# Patient Record
Sex: Male | Born: 1935 | Race: White | Hispanic: No | Marital: Married | Smoking: Former smoker
Health system: Southern US, Community
[De-identification: ages and names within clinical notes are randomized; demographics above are authoritative.]

## PROBLEM LIST (undated history)

## (undated) DIAGNOSIS — Z86010 Personal history of colonic polyps: Secondary | ICD-10-CM

## (undated) DIAGNOSIS — B0230 Zoster ocular disease, unspecified: Secondary | ICD-10-CM

## (undated) DIAGNOSIS — N400 Enlarged prostate without lower urinary tract symptoms: Secondary | ICD-10-CM

## (undated) DIAGNOSIS — E785 Hyperlipidemia, unspecified: Secondary | ICD-10-CM

## (undated) DIAGNOSIS — R972 Elevated prostate specific antigen [PSA]: Secondary | ICD-10-CM

## (undated) DIAGNOSIS — D400 Neoplasm of uncertain behavior of prostate: Secondary | ICD-10-CM

## (undated) DIAGNOSIS — R413 Other amnesia: Secondary | ICD-10-CM

## (undated) DIAGNOSIS — M1990 Unspecified osteoarthritis, unspecified site: Secondary | ICD-10-CM

## (undated) DIAGNOSIS — N402 Nodular prostate without lower urinary tract symptoms: Secondary | ICD-10-CM

## (undated) DIAGNOSIS — H409 Unspecified glaucoma: Secondary | ICD-10-CM

## (undated) DIAGNOSIS — E039 Hypothyroidism, unspecified: Secondary | ICD-10-CM

## (undated) DIAGNOSIS — B029 Zoster without complications: Secondary | ICD-10-CM

## (undated) DIAGNOSIS — D490 Neoplasm of unspecified behavior of digestive system: Secondary | ICD-10-CM

## (undated) DIAGNOSIS — K5900 Constipation, unspecified: Secondary | ICD-10-CM

## (undated) HISTORY — DX: Unspecified glaucoma: H40.9

## (undated) HISTORY — DX: Unspecified osteoarthritis, unspecified site: M19.90

## (undated) HISTORY — DX: Constipation, unspecified: K59.00

## (undated) HISTORY — DX: Other amnesia: R41.3

## (undated) HISTORY — DX: Hypothyroidism, unspecified: E03.9

## (undated) HISTORY — DX: Neoplasm of uncertain behavior of prostate: D40.0

## (undated) HISTORY — DX: Hyperlipidemia, unspecified: E78.5

## (undated) HISTORY — DX: Zoster ocular disease, unspecified: B02.30

## (undated) HISTORY — DX: Benign prostatic hyperplasia without lower urinary tract symptoms: N40.0

## (undated) HISTORY — DX: Elevated prostate specific antigen (PSA): R97.20

## (undated) HISTORY — DX: Personal history of colonic polyps: Z86.010

## (undated) HISTORY — DX: Nodular prostate without lower urinary tract symptoms: N40.2

## (undated) HISTORY — DX: Zoster without complications: B02.9

---
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
## 1970-09-29 HISTORY — PX: VASECTOMY: SHX75

## 1983-09-30 DIAGNOSIS — D490 Neoplasm of unspecified behavior of digestive system: Secondary | ICD-10-CM

## 1983-09-30 HISTORY — DX: Neoplasm of unspecified behavior of digestive system: D49.0

## 1991-09-30 HISTORY — PX: TUMOR REMOVAL: SHX12

## 1997-07-17 HISTORY — PX: COLECTOMY: SHX59

## 1998-09-29 HISTORY — PX: COLONOSCOPY: SHX174

## 2001-03-30 ENCOUNTER — Other Ambulatory Visit: Admission: RE | Admit: 2001-03-30 | Discharge: 2001-03-30 | Payer: Self-pay | Admitting: Urology

## 2002-04-21 ENCOUNTER — Ambulatory Visit (HOSPITAL_COMMUNITY): Admission: RE | Admit: 2002-04-21 | Discharge: 2002-04-21 | Payer: Self-pay | Admitting: Gastroenterology

## 2002-04-21 HISTORY — PX: COLONOSCOPY: SHX174

## 2003-12-28 ENCOUNTER — Emergency Department (HOSPITAL_COMMUNITY): Admission: EM | Admit: 2003-12-28 | Discharge: 2003-12-28 | Payer: Self-pay | Admitting: Emergency Medicine

## 2004-06-10 ENCOUNTER — Ambulatory Visit (HOSPITAL_COMMUNITY): Admission: RE | Admit: 2004-06-10 | Discharge: 2004-06-10 | Payer: Self-pay | Admitting: Gastroenterology

## 2004-06-10 HISTORY — PX: COLONOSCOPY: SHX174

## 2004-06-10 LAB — HM COLONOSCOPY

## 2008-09-29 HISTORY — PX: CATARACT EXTRACTION W/ INTRAOCULAR LENS IMPLANT: SHX1309

## 2009-03-25 DIAGNOSIS — R413 Other amnesia: Secondary | ICD-10-CM

## 2009-03-25 HISTORY — DX: Other amnesia: R41.3

## 2010-09-20 DIAGNOSIS — Z860100 Personal history of colon polyps, unspecified: Secondary | ICD-10-CM

## 2010-09-20 DIAGNOSIS — N400 Enlarged prostate without lower urinary tract symptoms: Secondary | ICD-10-CM

## 2010-09-20 DIAGNOSIS — H409 Unspecified glaucoma: Secondary | ICD-10-CM

## 2010-09-20 DIAGNOSIS — E785 Hyperlipidemia, unspecified: Secondary | ICD-10-CM

## 2010-09-20 DIAGNOSIS — M1990 Unspecified osteoarthritis, unspecified site: Secondary | ICD-10-CM

## 2010-09-20 HISTORY — DX: Unspecified glaucoma: H40.9

## 2010-09-20 HISTORY — DX: Benign prostatic hyperplasia without lower urinary tract symptoms: N40.0

## 2010-09-20 HISTORY — DX: Unspecified osteoarthritis, unspecified site: M19.90

## 2010-09-20 HISTORY — DX: Hyperlipidemia, unspecified: E78.5

## 2010-09-20 HISTORY — DX: Personal history of colonic polyps: Z86.010

## 2010-09-20 HISTORY — DX: Personal history of colon polyps, unspecified: Z86.0100

## 2010-09-24 DIAGNOSIS — K5900 Constipation, unspecified: Secondary | ICD-10-CM

## 2010-09-24 HISTORY — DX: Constipation, unspecified: K59.00

## 2011-05-02 DIAGNOSIS — B0230 Zoster ocular disease, unspecified: Secondary | ICD-10-CM

## 2011-05-02 HISTORY — DX: Zoster ocular disease, unspecified: B02.30

## 2011-05-04 ENCOUNTER — Emergency Department (HOSPITAL_COMMUNITY)
Admission: EM | Admit: 2011-05-04 | Discharge: 2011-05-04 | Disposition: A | Discharge status: Home / Self Care | Payer: Medicare Other | Attending: Emergency Medicine | Admitting: Emergency Medicine

## 2011-05-04 DIAGNOSIS — B029 Zoster without complications: Secondary | ICD-10-CM | POA: Insufficient documentation

## 2011-05-04 DIAGNOSIS — H409 Unspecified glaucoma: Secondary | ICD-10-CM | POA: Insufficient documentation

## 2011-05-04 DIAGNOSIS — H5710 Ocular pain, unspecified eye: Secondary | ICD-10-CM | POA: Insufficient documentation

## 2011-05-04 LAB — BASIC METABOLIC PANEL
BUN: 17 mg/dL (ref 6–23)
CO2: 26 mEq/L (ref 19–32)
Creatinine, Ser: 0.97 mg/dL (ref 0.50–1.35)
GFR calc non Af Amer: >60 mL/min (ref >60)
Glucose, Bld: 110 mg/dL — ABNORMAL HIGH (ref 70–99)

## 2011-05-04 LAB — CBC
HCT: 42.1 % (ref 39.0–52.0)
MCH: 30.6 pg (ref 26.0–34.0)
Platelets: 168 10*3/uL (ref 150–400)
RBC: 4.71 MIL/uL (ref 4.22–5.81)
RDW: 13.1 % (ref 11.5–15.5)

## 2011-05-13 DIAGNOSIS — R972 Elevated prostate specific antigen [PSA]: Secondary | ICD-10-CM

## 2011-05-13 DIAGNOSIS — B029 Zoster without complications: Secondary | ICD-10-CM

## 2011-05-13 DIAGNOSIS — D400 Neoplasm of uncertain behavior of prostate: Secondary | ICD-10-CM

## 2011-05-13 HISTORY — DX: Elevated prostate specific antigen (PSA): R97.20

## 2011-05-13 HISTORY — DX: Zoster without complications: B02.9

## 2011-05-13 HISTORY — DX: Neoplasm of uncertain behavior of prostate: D40.0

## 2012-05-18 DIAGNOSIS — N402 Nodular prostate without lower urinary tract symptoms: Secondary | ICD-10-CM

## 2012-05-18 HISTORY — DX: Nodular prostate without lower urinary tract symptoms: N40.2

## 2012-06-14 ENCOUNTER — Other Ambulatory Visit: Payer: Self-pay | Admitting: Dermatology

## 2012-11-02 ENCOUNTER — Other Ambulatory Visit: Payer: Self-pay | Admitting: Dermatology

## 2013-04-12 ENCOUNTER — Non-Acute Institutional Stay: Payer: Medicare Other | Admitting: Internal Medicine

## 2013-04-12 ENCOUNTER — Encounter: Payer: Self-pay | Admitting: Internal Medicine

## 2013-04-12 VITALS — BP 122/64 | HR 72 | Temp 97.2°F | Ht 67.0 in | Wt 178.0 lb

## 2013-04-12 DIAGNOSIS — R972 Elevated prostate specific antigen [PSA]: Secondary | ICD-10-CM

## 2013-04-12 DIAGNOSIS — N403 Nodular prostate with lower urinary tract symptoms: Secondary | ICD-10-CM

## 2013-04-12 DIAGNOSIS — R42 Dizziness and giddiness: Secondary | ICD-10-CM

## 2013-04-12 DIAGNOSIS — N138 Other obstructive and reflux uropathy: Secondary | ICD-10-CM | POA: Insufficient documentation

## 2013-04-12 MED ORDER — TAMSULOSIN HCL 0.4 MG PO CAPS: ORAL_CAPSULE | ORAL | Status: DC

## 2013-04-12 NOTE — Progress Notes (Signed)
  Subjective:    Patient ID: Joel [name], male    DOB: 1921-06-11, 79 y.o.   MRN: [id number]  HPI Dizzy for a month. Now improved. Just when walking and when he would get out bed in the morning.  Having urinary frequency. Was drinking about 8 glasses daily, but he has now cut back to about 5 glasses daily. Nocturia x 4-5. Denies dysuria or hematuria. Has hesitation. Has some double voiding.      Review of Systems  Constitutional: Positive for activity change. Negative for fever, chills, appetite change and fatigue.  HENT: Negative.   Eyes: Negative.   Respiratory: Negative.   Cardiovascular: Negative for chest pain, palpitations and leg swelling.  Gastrointestinal: Negative.   Endocrine: Negative.   Genitourinary: Positive for urgency, frequency and difficulty urinating.  Musculoskeletal: Negative.   Skin: Negative.   Neurological:       Recent dizziness has resolved.  Hematological: Negative.   Psychiatric/Behavioral: Negative.        Objective:BP 122/64  Pulse 72  Temp(Src) 97.2 F (36.2 C) (Oral)  Ht 5\' 7"  (1.702 m)  Wt 178 lb (80.74 kg)  BMI 27.87 kg/m2    Physical Exam  Constitutional: He is oriented to person, place, and time. He appears well-developed and well-nourished. No distress.  HENT:  Head: Normocephalic and atraumatic.  Eyes:  Corrective lenses.  Neck: Neck supple. No JVD present. No tracheal deviation present. No thyromegaly present.  Cardiovascular: Normal rate, regular rhythm, normal heart sounds and intact distal pulses.  Exam reveals no gallop and no friction rub.   No murmur heard. Pulmonary/Chest: No respiratory distress. He has no wheezes. He has no rales. He exhibits no tenderness.  Abdominal: He exhibits no distension and no mass. There is no tenderness.  Musculoskeletal: Normal range of motion. He exhibits no edema and no tenderness.  Lymphadenopathy:    He has no cervical adenopathy.  Neurological: He is alert and oriented to person,  place, and time. No cranial nerve deficit. Coordination normal.  Skin: No rash noted. No erythema. No pallor.  Psychiatric: He has a normal mood and affect. His behavior is normal. Judgment and thought content normal.          Assessment & Plan:  Nodular prostate with urinary obstruction - Plan: tamsulosin (FLOMAX) 0.4 MG CAPS, Urinalysis with Reflex Microscopic, CULTURE, URINE COMPREHENSIVE  Dizzy: improved - Plan: Comprehensive metabolic panel  Elevated prostate specific antigen (PSA) - Plan: PSA

## 2013-04-12 NOTE — Patient Instructions (Addendum)
Start tamsulosin for enlarged prostate that is likely to be the cause of your urinary frequency.

## 2013-04-18 ENCOUNTER — Other Ambulatory Visit: Payer: Self-pay

## 2013-04-18 ENCOUNTER — Telehealth: Payer: Self-pay

## 2013-04-18 MED ORDER — AMPICILLIN 500 MG PO CAPS: ORAL_CAPSULE | ORAL | Status: DC

## 2013-04-18 NOTE — Telephone Encounter (Signed)
Spoke with Mrs Vera about urine report, shows infection, will fax Rx to [facility].

## 2013-04-27 ENCOUNTER — Encounter: Payer: Self-pay | Admitting: Internal Medicine

## 2013-05-02 ENCOUNTER — Other Ambulatory Visit: Payer: Self-pay | Admitting: Dermatology

## 2013-05-19 HISTORY — PX: SKIN CANCER EXCISION: SHX779

## 2013-05-23 ENCOUNTER — Encounter: Payer: Self-pay | Admitting: Internal Medicine

## 2013-05-24 ENCOUNTER — Non-Acute Institutional Stay: Payer: Medicare Other | Admitting: Internal Medicine

## 2013-05-24 ENCOUNTER — Encounter: Payer: Self-pay | Admitting: Internal Medicine

## 2013-05-24 VITALS — BP 112/66 | HR 60 | Temp 96.4°F | Ht 67.0 in | Wt 177.0 lb

## 2013-05-24 DIAGNOSIS — C44221 Squamous cell carcinoma of skin of unspecified ear and external auricular canal: Secondary | ICD-10-CM

## 2013-05-24 DIAGNOSIS — E785 Hyperlipidemia, unspecified: Secondary | ICD-10-CM

## 2013-05-24 DIAGNOSIS — R972 Elevated prostate specific antigen [PSA]: Secondary | ICD-10-CM

## 2013-05-24 DIAGNOSIS — C44222 Squamous cell carcinoma of skin of right ear and external auricular canal: Secondary | ICD-10-CM

## 2013-05-24 DIAGNOSIS — M1990 Unspecified osteoarthritis, unspecified site: Secondary | ICD-10-CM

## 2013-05-24 DIAGNOSIS — R413 Other amnesia: Secondary | ICD-10-CM

## 2013-05-24 NOTE — Progress Notes (Signed)
Subjective:    Patient ID: Joel [name], male    DOB: 1921-06-07, 79 y.o.   MRN: [id number]  HPI  Feeling well. Here for review of medical issues and a completer exam.  Elevated prostate specific antigen (PSA): was nor rechecked prior to this visit  Other and unspecified hyperlipidemia: satisfactory values  Memory loss: mild  Osteoarthrosis, unspecified whether generalized or localized, unspecified site: no acute pain. Chronic joint stiffness in knees and hips.  Squamous cell cancer of external ear, right; bandaged. Removed squamous cell cancer 05/19/13 by Dr. Irene Limbo.     Current Outpatient Prescriptions on File Prior to Visit  Medication Sig Dispense Refill  . ampicillin (PRINCIPEN) 500 MG capsule Take one capsule three times daily for 10 days for UTI  30 capsule  0  . docusate sodium (COLACE) 100 MG capsule Take 100 mg by mouth. Take one daily for stool softener as needed      . latanoprost (XALATAN) 0.005 % ophthalmic solution One drop in each eye daily      . Multiple Vitamins-Minerals (CENTRUM) tablet Take 1 tablet by mouth daily.      . Psyllium (NATURAL FIBER LAXATIVE PO) Take by mouth. Use as directed      . tamsulosin (FLOMAX) 0.4 MG CAPS One daily to help prostate and to reduce urinary frequency  90 capsule  3  . timolol (TIMOPTIC-XR) 0.5 % ophthalmic gel-forming One drop in each eye daily       No current facility-administered medications on file prior to visit.   Immunization History  Administered Date(s) Administered  . Influenza Whole 06/29/2012  . Pneumococcal Polysaccharide 09/30/1995  . Td 09/29/1994  . Zoster 09/30/2007    Active Ambulatory Problems    Diagnosis Date Noted  . Nodular prostate with urinary obstruction 04/12/2013   Resolved Ambulatory Problems    Diagnosis Date Noted  . No Resolved Ambulatory Problems   Past Medical History  Diagnosis Date  . Nodular prostate without urinary obstruction 05/18/2012  . Herpes zoster 05/13/2011  .  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 Elevated prostate specific antigen (PSA) 05/13/2011  . Herpes zoster with ophthalmic complication 05/02/2011  . Unspecified constipation 09/24/2010  . Other and unspecified hyperlipidemia 09/20/2010  . Unspecified glaucoma(365.9) 09/20/2010  . Hypertrophy of prostate without urinary obstruction and other lower urinary tract symptoms (LUTS) 09/20/2010  . Osteoarthrosis, unspecified whether generalized or localized, unspecified site 09/20/2010  . Personal history of colonic polyps 09/20/2010  . Memory loss 03/25/2009   Past Surgical History  Procedure Laterality Date  . Tonsillectomy  1938  . Appendectomy  1940  . Vasectomy  1972  . Colectomy  07/17/1997    sigmoid submucosal lipoma Dr. Randa Evens  . Cataract extraction w/ intraocular lens implant Right 2010    Dr. Charlotte Sanes  . Tumor removal  1993    Removal of Benign Intestinal Tumor   . Skin cancer excision Right 05/19/2013    ear Dr. Lynnell Dike  . Colonoscopy  2000    normal  . Colonoscopy  04/21/2002    polypectomy  . Colonoscopy  06/10/2004    no polyps   CONSULTANTS Derm: [facility] Derm:  Irene Limbo GI: Claria Dice Ophth: [facility]rology: Vernie Ammons   Family Status  Relation Status Death Age  . Mother Deceased     Unknown cause of death   . Father Deceased     Heart Disease   . Daughter Alive   . Son Alive   . Son  Alive   . Son Alive   . Brother Deceased     Heart Disease  . Sister Deceased     Heart Disease   . Brother Deceased     Lung Cancer   . Brother Deceased     Lung Cancer   . Sister Deceased     Lung Cancer    History   Social History  . Marital Status: Married    Spouse Name: N/A    Number of Children: N/A  . Years of Education: N/A   Occupational History  . retired [employer]vertising account planner    Social History Main Topics  . Smoking status: Former Smoker    Quit date: 04/12/1974  . Smokeless tobacco: Never Used  . Alcohol Use: Yes     Comment: wine  occasionaly  . Drug Use: No  . Sexual Activity: None   Other Topics Concern  . None   Social History Narrative   Lives at [facility] with wife, moved in 02/10/2006    Review of Systems  Constitutional: Positive for activity change. Negative for fever, chills, appetite change and fatigue.  HENT: Negative.   Eyes: Negative.   Respiratory: Negative.   Cardiovascular: Negative for chest pain, palpitations and leg swelling.  Gastrointestinal: Negative.   Endocrine: Negative.   Genitourinary: Positive for urgency, frequency and difficulty urinating.       Hx of nodular prostate.  Musculoskeletal: Negative.   Skin: Negative.        History of herpes zoster in the ophthalmic branch that is healed.  Neurological: Negative.   Hematological: Negative.   Psychiatric/Behavioral: Negative.        Objective:   Physical Exam  Constitutional: He is oriented to person, place, and time. He appears well-developed and well-nourished. No distress.  HENT:  Head: Normocephalic and atraumatic.  Eyes:  Corrective lenses.  Neck: Neck supple. No JVD present. No tracheal deviation present. No thyromegaly present.  Cardiovascular: Normal rate, regular rhythm, normal heart sounds and intact distal pulses.  Exam reveals no gallop and no friction rub.   No murmur heard. Pulmonary/Chest: No respiratory distress. He has no wheezes. He has no rales. He exhibits no tenderness.  Abdominal: He exhibits no distension and no mass. There is no tenderness.  Genitourinary: Rectum normal and penis normal. Guaiac negative stool. No penile tenderness.  Prostate is 2+ enlarged and nodular.  Musculoskeletal: Normal range of motion. He exhibits no edema and no tenderness.  Lymphadenopathy:    He has no cervical adenopathy.  Neurological: He is alert and oriented to person, place, and time. No cranial nerve deficit. Coordination normal.  05/24/13 MMSE 28/30. Failed clock drawing. Normal vibratory sensation.  Skin: No rash  noted. No erythema. No pallor.  Psychiatric: He has a normal mood and affect. His behavior is normal. Judgment and thought content normal.     LAB REVIEW 05/16/13 CBC; normal  CMP: normal  Lipids: TC 189, trig 114, HDL 40, LDL126     Assessment & Plan:  Elevated prostate specific antigen (PSA):recheck prior to next visit  Other and unspecified hyperlipidemia:controlled  Memory loss: stable  Osteoarthrosis, unspecified whether generalized or localized, unspecified site: unchanged  Squamous cell cancer of external ear, right: healing

## 2013-05-24 NOTE — Patient Instructions (Signed)
Continue current medication.

## 2013-05-24 NOTE — Progress Notes (Signed)
Failed clock drawing  

## 2013-06-07 ENCOUNTER — Encounter: Payer: Self-pay | Admitting: Internal Medicine

## 2013-06-27 ENCOUNTER — Telehealth: Payer: Self-pay | Admitting: *Deleted

## 2013-06-27 ENCOUNTER — Other Ambulatory Visit: Payer: Self-pay | Admitting: *Deleted

## 2013-06-27 NOTE — Telephone Encounter (Signed)
Patient was called and informed that she has appointment with Dr. Chilton Si on Tuesday at 9:00am in the clinic at the nursing home.

## 2013-06-28 ENCOUNTER — Non-Acute Institutional Stay: Payer: Medicare Other | Admitting: Internal Medicine

## 2013-06-28 ENCOUNTER — Encounter: Payer: Self-pay | Admitting: Internal Medicine

## 2013-06-28 VITALS — BP 120/62 | HR 62 | Temp 97.6°F | Ht 67.0 in | Wt 180.0 lb

## 2013-06-28 DIAGNOSIS — N138 Other obstructive and reflux uropathy: Secondary | ICD-10-CM

## 2013-06-28 DIAGNOSIS — R972 Elevated prostate specific antigen [PSA]: Secondary | ICD-10-CM

## 2013-06-28 MED ORDER — FINASTERIDE 5 MG PO TABS: ORAL_TABLET | ORAL | Status: DC

## 2013-06-28 NOTE — Patient Instructions (Signed)
Continue current medications. Add Proscar to current medications.

## 2013-06-28 NOTE — Progress Notes (Signed)
Subjective:    Patient ID: Joel [name], male    DOB: [birth year], 79 y.o.   MRN: [id number]  HPI History of BPH with outlet obstruction. Addition of tamsulosin was helpful. In the last week, he has has the onset of reduced urinary flow again. He denies dysuria. No incontinence.   Had UTI in July with enterococcus. Had dysuria at that time. Improved when treated.  Current Outpatient Prescriptions on File Prior to Visit  Medication Sig Dispense Refill  . docusate sodium (COLACE) 100 MG capsule Take 100 mg by mouth. Take one daily for stool softener as needed      . latanoprost (XALATAN) 0.005 % ophthalmic solution One drop in each eye daily      . Multiple Vitamins-Minerals (CENTRUM) tablet Take 1 tablet by mouth daily.      . Psyllium (NATURAL FIBER LAXATIVE PO) Take by mouth. Use as directed      . tamsulosin (FLOMAX) 0.4 MG CAPS One daily to help prostate and to reduce urinary frequency  90 capsule  3  . timolol (TIMOPTIC-XR) 0.5 % ophthalmic gel-forming One drop in each eye daily       No current facility-administered medications on file prior to visit.    Review of Systems  Constitutional: Positive for activity change. Negative for fever, chills, appetite change and fatigue.  HENT: Negative.   Eyes: Negative.   Respiratory: Negative.   Cardiovascular: Negative for chest pain, palpitations and leg swelling.  Gastrointestinal: Negative.   Endocrine: Negative.   Genitourinary: Positive for urgency, frequency and difficulty urinating.       Hx of nodular prostate.  Musculoskeletal: Negative.   Skin: Negative.        History of herpes zoster in the ophthalmic branch that is healed.  Neurological: Negative.   Hematological: Negative.   Psychiatric/Behavioral: Negative.        Objective:BP 120/62  Pulse 62  Temp(Src) 97.6 F (36.4 C) (Oral)  Ht 5\' 7"  (1.702 m)  Wt 180 lb (81.647 kg)  BMI 28.19 kg/m2    Physical Exam  Constitutional: He is oriented to person, place,  and time. He appears well-developed and well-nourished. No distress.  HENT:  Head: Normocephalic and atraumatic.  Eyes:  Corrective lenses.  Neck: Neck supple. No JVD present. No tracheal deviation present. No thyromegaly present.  Cardiovascular: Normal rate, regular rhythm, normal heart sounds and intact distal pulses.  Exam reveals no gallop and no friction rub.   No murmur heard. Pulmonary/Chest: No respiratory distress. He has no wheezes. He has no rales. He exhibits no tenderness.  Abdominal: He exhibits no distension and no mass. There is no tenderness.  Genitourinary: Rectum normal and penis normal. Guaiac negative stool. No penile tenderness.  Prostate is 2+ enlarged and nodular.  Musculoskeletal: Normal range of motion. He exhibits no edema and no tenderness.  Lymphadenopathy:    He has no cervical adenopathy.  Neurological: He is alert and oriented to person, place, and time. No cranial nerve deficit. Coordination normal.  05/24/13 MMSE 28/30. Failed clock drawing. Normal vibratory sensation.  Skin: No rash noted. No erythema. No pallor.  Psychiatric: He has a normal mood and affect. His behavior is normal. Judgment and thought content normal.     LAB REVIEW 05/01/11 PSA: 5.50 04/18/13 urine culture: enterococcus >100,000 colonies. 05/16/13 CBC: nl  CMP: nl  Lipids; TC 189, trig 114, HDL 40, LDL126  PSA 8.90     Assessment & Plan:  Nodular prostate with urinary obstruction:    -  Plan: finasteride (PROSCAR) 5 MG tablet. Continue tamsulosin.  Elevated prostate specific antigen (PSA) - Plan: finasteride (PROSCAR) 5 MG tablet

## 2013-08-28 ENCOUNTER — Encounter (HOSPITAL_COMMUNITY): Payer: Self-pay | Admitting: Emergency Medicine

## 2013-08-28 ENCOUNTER — Emergency Department (HOSPITAL_COMMUNITY)
Admission: EM | Admit: 2013-08-28 | Discharge: 2013-08-28 | Disposition: A | Discharge status: Home / Self Care | Payer: Medicare Other | Attending: Emergency Medicine | Admitting: Emergency Medicine

## 2013-08-28 ENCOUNTER — Emergency Department (HOSPITAL_COMMUNITY): Payer: Medicare Other

## 2013-08-28 DIAGNOSIS — Y929 Unspecified place or not applicable: Secondary | ICD-10-CM | POA: Insufficient documentation

## 2013-08-28 DIAGNOSIS — Z860100 Personal history of colon polyps, unspecified: Secondary | ICD-10-CM | POA: Insufficient documentation

## 2013-08-28 DIAGNOSIS — S83502A Sprain of unspecified cruciate ligament of left knee, initial encounter: Secondary | ICD-10-CM

## 2013-08-28 DIAGNOSIS — Z79899 Other long term (current) drug therapy: Secondary | ICD-10-CM | POA: Insufficient documentation

## 2013-08-28 DIAGNOSIS — K5900 Constipation, unspecified: Secondary | ICD-10-CM | POA: Insufficient documentation

## 2013-08-28 DIAGNOSIS — Z8739 Personal history of other diseases of the musculoskeletal system and connective tissue: Secondary | ICD-10-CM | POA: Insufficient documentation

## 2013-08-28 DIAGNOSIS — N400 Enlarged prostate without lower urinary tract symptoms: Secondary | ICD-10-CM | POA: Insufficient documentation

## 2013-08-28 DIAGNOSIS — S83509A Sprain of unspecified cruciate ligament of unspecified knee, initial encounter: Secondary | ICD-10-CM | POA: Insufficient documentation

## 2013-08-28 DIAGNOSIS — Z8619 Personal history of other infectious and parasitic diseases: Secondary | ICD-10-CM | POA: Insufficient documentation

## 2013-08-28 DIAGNOSIS — W010XXA Fall on same level from slipping, tripping and stumbling without subsequent striking against object, initial encounter: Secondary | ICD-10-CM | POA: Insufficient documentation

## 2013-08-28 DIAGNOSIS — Z87891 Personal history of nicotine dependence: Secondary | ICD-10-CM | POA: Insufficient documentation

## 2013-08-28 DIAGNOSIS — Z8669 Personal history of other diseases of the nervous system and sense organs: Secondary | ICD-10-CM | POA: Insufficient documentation

## 2013-08-28 DIAGNOSIS — Y939 Activity, unspecified: Secondary | ICD-10-CM | POA: Insufficient documentation

## 2013-08-28 NOTE — ED Provider Notes (Signed)
CSN: [id number]     Arrival date & time 08/28/13  0909 History   First MD Initiated Contact with Patient 08/28/13 [phone]     Chief Complaint  Patient presents with  . Fall  . Leg Pain   (Consider location/radiation/quality/duration/timing/severity/associated sxs/prior Treatment) Patient is a 79 y.o. male presenting with fall and leg pain.  Fall Associated symptoms include arthralgias. Pertinent negatives include no abdominal pain, chills, coughing, diaphoresis, fever, headaches, nausea, rash, sore throat or vomiting.  Leg Pain Associated symptoms: no back pain and no fever     79 y/o male here with L knee pain after a mechanical fall last night, He describes that he was coming back from the bathroom and turning when he tripped over his foot and fell landing mostly on his L knee and L shoulder. He was able to bear weight right away and went to play cards. When he stood up from the card table he describes 5/10 sharp popliteal  Pain without radiation. He states that standing or walking worsens it and rest completely resolves it. He states his L shoulder and Elbow hurt minimally. He denies decreased ROM of any of the affected joints.   Past Medical History  Diagnosis Date  . Nodular prostate without urinary obstruction 05/18/2012  . Herpes zoster 05/13/2011  . 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 Elevated prostate specific antigen (PSA) 05/13/2011  . Herpes zoster with ophthalmic complication 05/02/2011  . Unspecified constipation 09/24/2010  . Other and unspecified hyperlipidemia 09/20/2010  . Unspecified glaucoma(365.9) 09/20/2010    bilateral  . Hypertrophy of prostate without urinary obstruction and other lower urinary tract symptoms (LUTS) 09/20/2010  . Osteoarthrosis, unspecified whether generalized or localized, unspecified site 09/20/2010  . Personal history of colonic polyps 09/20/2010  . Memory loss 03/25/2009   Past Surgical History  Procedure Laterality Date  .  Tonsillectomy  1938  . Appendectomy  1940  . Vasectomy  1972  . Colectomy  07/17/1997    sigmoid submucosal lipoma Dr. Randa Evens  . Cataract extraction w/ intraocular lens implant Right 2010    Dr. Charlotte Sanes  . Tumor removal  1993    Removal of Benign Intestinal Tumor   . Skin cancer excision Right 05/19/2013    ear Dr. Lynnell Dike  . Colonoscopy  2000    normal  . Colonoscopy  04/21/2002    polypectomy  . Colonoscopy  06/10/2004    no polyps   Family History  Problem Relation Age of Onset  . Heart disease Father    History  Substance Use Topics  . Smoking status: Former Smoker    Quit date: 04/12/1974  . Smokeless tobacco: Never Used  . Alcohol Use: Yes     Comment: wine occasionaly    Review of Systems  Constitutional: Negative for fever, chills and diaphoresis.  HENT: Negative for sore throat.   Eyes: Negative for visual disturbance.  Respiratory: Negative for cough and shortness of breath.   Gastrointestinal: Negative for nausea, vomiting, abdominal pain and diarrhea.  Genitourinary: Negative for dysuria.  Musculoskeletal: Positive for arthralgias. Negative for back pain.  Skin: Positive for wound. Negative for rash.  Neurological: Negative for headaches.    Allergies  Review of patient's allergies indicates no known allergies.  Home Medications   Current Outpatient Rx  Name  Route  Sig  Dispense  Refill  . docusate sodium (COLACE) 100 MG capsule   Oral   Take 100 mg by mouth daily as needed for mild constipation.          Marland Kitchen  finasteride (PROSCAR) 5 MG tablet   Oral   Take 5 mg by mouth daily.         Marland Kitchen latanoprost (XALATAN) 0.005 % ophthalmic solution   Both Eyes   Place 1 drop into both eyes at bedtime.          . Multiple Vitamins-Minerals (CENTRUM) tablet   Oral   Take 1 tablet by mouth daily.         . Psyllium (NATURAL FIBER LAXATIVE PO)   Oral   Take 1 tablet by mouth daily as needed (constipation). Use as directed         . tamsulosin  (FLOMAX) 0.4 MG CAPS capsule   Oral   Take 0.4 mg by mouth daily.         . timolol (TIMOPTIC-XR) 0.5 % ophthalmic gel-forming   Both Eyes   Place 1 drop into both eyes daily. One drop in each eye daily          BP 132/70  Pulse 74  Temp(Src) 98 F (36.7 C) (Oral)  Resp 14  SpO2 96% Physical Exam  Nursing note and vitals reviewed. Constitutional: He is oriented to person, place, and time. He appears well-developed and well-nourished. No distress.  HENT:  Head: Normocephalic and atraumatic.  Eyes: EOM are normal. Pupils are equal, round, and reactive to light.  Neck: Normal range of motion. Neck supple.  Cardiovascular: Normal rate, regular rhythm and normal heart sounds.   Pulmonary/Chest: Effort normal and breath sounds normal. No respiratory distress. He has no wheezes.  Abdominal: Soft. Bowel sounds are normal. There is no tenderness.  Musculoskeletal: He exhibits no edema.       Left knee: He exhibits no LCL laxity, normal patellar mobility, no bony tenderness, normal meniscus and no MCL laxity. Tenderness found. No medial joint line, no lateral joint line, no MCL, no LCL and no patellar tendon tenderness noted.  Pain with stress on PCL, no laxity Abrasion on patella  L shoulder and elbow without tenderness along bony landmarks and joint line, Full ROM  Neurological: He is alert and oriented to person, place, and time.  Skin: Skin is warm and dry. He is not diaphoretic.  Psychiatric: He has a normal mood and affect.    ED Course  Procedures (including critical care time) Labs Review Labs Reviewed - No data to display Imaging Review Dg Knee Complete 4 Views Left  08/28/2013   CLINICAL DATA:  Fall, anterior knee laceration  EXAM: LEFT KNEE - COMPLETE 4+ VIEW  COMPARISON:  None.  FINDINGS: No fracture or dislocation is seen.  Mild degenerative changes with sharpening of the tibial spines and mild lateral and patellofemoral compartment osteophytosis.  No suprapatellar  knee joint effusion.  No radiopaque foreign body is seen.  Vascular calcifications.  IMPRESSION: No fracture, dislocation, or radiopaque foreign body is seen.  Mild degenerative changes.   Electronically Signed   By: Charline Bills M.D.   On: 08/28/2013 10:03    EKG Interpretation   None       MDM   1. Sprain of cruciate ligament of knee, left, initial encounter    79 year old male here with left knee pain after a mechanical fall last night. On exam he has a small contusion on the anterior knee and pain but no laxity with posterior drawer sign. Plain film of L knee rules out fracture. Will consider this PCL strain and treated with rest, ice, compression. Ace wrap placed here. Offered pain meds  patient declines.  No indication for imaging of L shoulder or elbow with no pain able to be elicited and full ROM.   Red flags provided, return for worsening symptoms Recommend PCP followup in about 5 days if pain not better.  Murtis Sink, MD [facility] Resident, PGY-2 08/28/2013, 10:19 AM      Elenora Gamma, MD 08/28/13 1020

## 2013-08-28 NOTE — ED Provider Notes (Signed)
I saw and evaluated the patient, reviewed the resident's note and I agree with the findings and plan.   .Face to face Exam:  General:  Awake HEENT:  Atraumatic Resp:  Normal effort Abd:  Nondistended Neuro:No focal weakness  Nelia Shi, MD 08/28/13 1022

## 2013-08-28 NOTE — ED Notes (Signed)
Pt reports he tripped over his own feet, fell last night onto his left side. Denies hitting his head or LOC. Complaint of left knee pain 2/10 at present. Left shoulder pain as well, but knee is pts concern. No bruising on swelling noted. Pain with ambulation and bending knee. Small laceration below left knee. Band aid applied.

## 2013-09-27 ENCOUNTER — Non-Acute Institutional Stay: Payer: Medicare Other | Admitting: Internal Medicine

## 2013-09-27 ENCOUNTER — Encounter: Payer: Self-pay | Admitting: Internal Medicine

## 2013-09-27 VITALS — BP 106/62 | HR 76 | Ht 67.0 in | Wt 178.0 lb

## 2013-09-27 DIAGNOSIS — I499 Cardiac arrhythmia, unspecified: Secondary | ICD-10-CM

## 2013-09-27 DIAGNOSIS — M25569 Pain in unspecified knee: Secondary | ICD-10-CM

## 2013-09-27 DIAGNOSIS — M25562 Pain in left knee: Secondary | ICD-10-CM

## 2013-09-27 DIAGNOSIS — N138 Other obstructive and reflux uropathy: Secondary | ICD-10-CM

## 2013-09-27 DIAGNOSIS — R42 Dizziness and giddiness: Secondary | ICD-10-CM

## 2013-09-27 DIAGNOSIS — I495 Sick sinus syndrome: Secondary | ICD-10-CM

## 2013-09-27 NOTE — Progress Notes (Signed)
Patient ID: Joel [name], male   DOB: August 30, 1921, 79 y.o.   MRN: [id number]    Location:  [facility]   Place of Service: Clinic (12)    No Known Allergies  Chief Complaint  Patient presents with  . Dizziness    when he stands up, worse in morning. Was taking Proscar and Flomax in morning together.    HPI:  Dizzy: usually occurs with change in position. Has to hold on to furniture. Denies change in vision. Not aware of any irregularity in heart rhythm. Denies chest pain. Nonausea or headache. Says he has been dizzy since July 2014, when the Flomax was added. Takes both Flomax and Proscar in the morning.   Nodular prostate with urinary obstruction; Addition of Flomax in July 2014 resulted in significant improvement in urinary obstructive symptoms.  Left knee pain: Tender in the posterior aspect. Improved since his trip to the ER in November 2014.    Medications: Patient's Medications  New Prescriptions   No medications on file  Previous Medications   DOCUSATE SODIUM (COLACE) 100 MG CAPSULE    Take 100 mg by mouth daily as needed for mild constipation.    FINASTERIDE (PROSCAR) 5 MG TABLET    Take 5 mg by mouth daily.   LATANOPROST (XALATAN) 0.005 % OPHTHALMIC SOLUTION    Place 1 drop into both eyes at bedtime.    MULTIPLE VITAMINS-MINERALS (CENTRUM) TABLET    Take 1 tablet by mouth daily.   PSYLLIUM (NATURAL FIBER LAXATIVE PO)    Take 1 tablet by mouth daily as needed (constipation). Use as directed   TAMSULOSIN (FLOMAX) 0.4 MG CAPS CAPSULE    Take 0.4 mg by mouth daily.   TIMOLOL (TIMOPTIC-XR) 0.5 % OPHTHALMIC GEL-FORMING    Place 1 drop into both eyes daily. One drop in each eye daily  Modified Medications   No medications on file  Discontinued Medications   No medications on file     Review of Systems  Constitutional: Positive for activity change. Negative for fever, chills, appetite change and fatigue.  HENT: Negative.   Eyes: Negative.   Respiratory:  Negative.   Cardiovascular: Negative for chest pain, palpitations and leg swelling.  Gastrointestinal: Negative.   Endocrine: Negative.   Genitourinary: Positive for urgency, frequency and difficulty urinating.       Hx of nodular prostate.  Musculoskeletal:       Miild discomfort in the left posterior knee. No  effusioin.  Skin: Negative.        History of herpes zoster in the ophthalmic branch that is healed.  Neurological: Positive for dizziness. Negative for seizures, light-headedness, numbness and headaches.  Hematological: Negative.   Psychiatric/Behavioral: Negative.     Filed Vitals:   09/27/13 0908  BP: 106/62  Pulse: 76  Height: 5\' 7"  (1.702 m)  Weight: 178 lb (80.74 kg)   Physical Exam  Constitutional: He is oriented to person, place, and time. He appears well-developed and well-nourished. No distress.  HENT:  Head: Normocephalic and atraumatic.  Eyes:  Corrective lenses.  Neck: Neck supple. No JVD present. No tracheal deviation present. No thyromegaly present.  Cardiovascular: Normal rate, normal heart sounds and intact distal pulses.  Exam reveals no gallop and no friction rub.   No murmur heard. Irregular rhythm.  Pulmonary/Chest: No respiratory distress. He has no wheezes. He has no rales. He exhibits no tenderness.  Abdominal: He exhibits no distension and no mass. There is no tenderness.  Genitourinary: Rectum normal  and penis normal. Guaiac negative stool. No penile tenderness.  Prostate is 2+ enlarged and nodular.  Musculoskeletal: Normal range of motion. He exhibits no edema and no tenderness.  Mild discomfort in the left posterior knee. No effusion. Gait instability.  Lymphadenopathy:    He has no cervical adenopathy.  Neurological: He is alert and oriented to person, place, and time. No cranial nerve deficit. Coordination normal.  05/24/13 MMSE 28/30. Failed clock drawing. Normal vibratory sensation.  Skin: No rash noted. No erythema. No pallor.    Psychiatric: He has a normal mood and affect. His behavior is normal. Judgment and thought content normal.     Labs reviewed: No visits with results within 3 Month(s) from this visit. Latest known visit with results is:  Abstract on 05/23/2013  Component Date Value Range Status  . HM Colonoscopy 06/10/2004 Colonoscopy Dr.Edwards   Final     04/18/13 urine culture: enterococcus >100,000 colonies.  05/01/11 PSA: 5.50 05/16/13 CBC: nl   CMP: nl   Lipids; TC 189, trig 114, HDL 40, LDL126   PSA 8.90 44/03/47QQV; rate 69. Sinus arrhythmia.   Assessment/Plan Dizzy: possibly related to Flomax or the combination with Proscar. Advised to split the drugs apart and take the Flomax after lunch or in the evening.  Nodular prostate with urinary obstruction; continue both FlomaxandProscar  Left knee pain; improved  Sinus arrhythmia: just observe. Not likely to relate to dizziness.

## 2013-09-27 NOTE — Patient Instructions (Signed)
Move Flomax dosing to after lunch or in thre evening.

## 2013-10-05 ENCOUNTER — Encounter: Payer: Self-pay | Admitting: Internal Medicine

## 2013-11-22 ENCOUNTER — Encounter: Payer: Self-pay | Admitting: Internal Medicine

## 2013-11-22 ENCOUNTER — Non-Acute Institutional Stay: Payer: Medicare Other | Admitting: Internal Medicine

## 2013-11-22 VITALS — BP 116/64 | HR 68 | Wt 176.0 lb

## 2013-11-22 DIAGNOSIS — R972 Elevated prostate specific antigen [PSA]: Secondary | ICD-10-CM

## 2013-11-22 DIAGNOSIS — N403 Nodular prostate with lower urinary tract symptoms: Principal | ICD-10-CM

## 2013-11-22 DIAGNOSIS — R42 Dizziness and giddiness: Secondary | ICD-10-CM

## 2013-11-22 DIAGNOSIS — R413 Other amnesia: Secondary | ICD-10-CM

## 2013-11-22 DIAGNOSIS — N138 Other obstructive and reflux uropathy: Secondary | ICD-10-CM

## 2013-11-23 ENCOUNTER — Other Ambulatory Visit: Payer: Self-pay | Admitting: Internal Medicine

## 2013-12-05 NOTE — Progress Notes (Signed)
Patient ID: Joel [name], male   DOB: [birth year], 80 y.o.   MRN: [id number]    Location:  [facility] (12)    No Known Allergies  Chief Complaint  Patient presents with  . Medical Managment of Chronic Issues    nodular prostate, elevated PSA    HPI:  Dizzy feeling when getting out of a chair.  Memory failing.  Hx elevated PSA and nodular prostate. Denies dysuria. Has some hesitation.  Medications: Patient's Medications  New Prescriptions   No medications on file  Previous Medications   DOCUSATE SODIUM (COLACE) 100 MG CAPSULE    Take 100 mg by mouth daily as needed for mild constipation.    FINASTERIDE (PROSCAR) 5 MG TABLET    Take 5 mg by mouth daily.   LATANOPROST (XALATAN) 0.005 % OPHTHALMIC SOLUTION    Place 1 drop into both eyes at bedtime.    MULTIPLE VITAMINS-MINERALS (CENTRUM) TABLET    Take 1 tablet by mouth daily.   PSYLLIUM (NATURAL FIBER LAXATIVE PO)    Take 1 tablet by mouth daily as needed (constipation). Use as directed   TAMSULOSIN (FLOMAX) 0.4 MG CAPS CAPSULE    Take 0.4 mg by mouth daily.   TIMOLOL (TIMOPTIC-XR) 0.5 % OPHTHALMIC GEL-FORMING    Place 1 drop into both eyes daily. One drop in each eye daily  Modified Medications   Modified Medication Previous Medication   FINASTERIDE (PROSCAR) 5 MG TABLET finasteride (PROSCAR) 5 MG tablet      TAKE ONE DAILY TO HELP SHRINK THE PROSTATE    One daily to help shrink the prostate  Discontinued Medications   No medications on file     Review of Systems  Constitutional: Positive for activity change. Negative for fever, chills, appetite change and fatigue.  HENT: Negative.   Eyes: Negative.   Respiratory: Negative.   Cardiovascular: Negative for chest pain, palpitations and leg swelling.  Gastrointestinal: Negative.   Endocrine: Negative.   Genitourinary: Positive for urgency, frequency and difficulty urinating.       Hx of nodular prostate.  Musculoskeletal:   Miild discomfort in the left posterior knee. No  effusioin.  Skin: Negative.        History of herpes zoster in the ophthalmic branch that is healed.  Neurological: Positive for dizziness. Negative for seizures, light-headedness, numbness and headaches.  Hematological: Negative.   Psychiatric/Behavioral: Negative.     Filed Vitals:   11/22/13 1006  BP: 116/64  Pulse: 68  Weight: 176 lb (79.833 kg)   Physical Exam  Constitutional: He is oriented to person, place, and time. He appears well-developed and well-nourished. No distress.  HENT:  Head: Normocephalic and atraumatic.  Eyes:  Corrective lenses.  Neck: Neck supple. No JVD present. No tracheal deviation present. No thyromegaly present.  Cardiovascular: Normal rate, normal heart sounds and intact distal pulses.  Exam reveals no gallop and no friction rub.   No murmur heard. Irregular rhythm.  Pulmonary/Chest: No respiratory distress. He has no wheezes. He has no rales. He exhibits no tenderness.  Abdominal: He exhibits no distension and no mass. There is no tenderness.  Genitourinary: Rectum normal and penis normal. Guaiac negative stool. No penile tenderness.  Prostate is 2+ enlarged and nodular.  Musculoskeletal: Normal range of motion. He exhibits no edema and no tenderness.  Mild discomfort in the left posterior knee. No effusion. Gait instability.  Lymphadenopathy:    He has no cervical adenopathy.  Neurological: He  is alert and oriented to person, place, and time. No cranial nerve deficit. Coordination normal.  05/24/13 MMSE 28/30. Failed clock drawing. Normal vibratory sensation.  Skin: No rash noted. No erythema. No pallor.  Psychiatric: He has a normal mood and affect. His behavior is normal. Judgment and thought content normal.     Labs reviewed: 05/16/13 PSA 8.90 11/22/13 PSA 2.70    Assessment/Plan  1. Nodular prostate with urinary obstruction Contributed to the High PSA in the past  2. Elevated prostate  specific antigen (PSA) Now normal with treatment of the nodular prostate  3. Memory loss unchanged  4. Dizzy chronic

## 2013-12-21 ENCOUNTER — Encounter: Payer: Self-pay | Admitting: Internal Medicine

## 2014-02-14 ENCOUNTER — Encounter: Payer: Self-pay | Admitting: *Deleted

## 2014-02-14 ENCOUNTER — Other Ambulatory Visit (HOSPITAL_COMMUNITY): Payer: Self-pay | Admitting: Internal Medicine

## 2014-02-14 ENCOUNTER — Non-Acute Institutional Stay: Payer: Medicare Other | Admitting: Internal Medicine

## 2014-02-14 ENCOUNTER — Encounter: Payer: Self-pay | Admitting: Internal Medicine

## 2014-02-14 VITALS — BP 112/50 | HR 76 | Temp 97.5°F | Resp 18 | Wt 177.0 lb

## 2014-02-14 DIAGNOSIS — R1314 Dysphagia, pharyngoesophageal phase: Secondary | ICD-10-CM | POA: Insufficient documentation

## 2014-02-14 DIAGNOSIS — R1310 Dysphagia, unspecified: Secondary | ICD-10-CM

## 2014-02-14 NOTE — Progress Notes (Signed)
Patient was scheduled for Modified Barium Swallow on May 28,2015 @ 1:00pm [facility] Radiology Dept. Patient 's wife was notified of the time and location of the procedure. I also informed Mrs. Deman that I would be sending her a reminder note in the mail.

## 2014-02-14 NOTE — Progress Notes (Signed)
Patient ID: Joel [name], male   DOB: [birth year], 80 y.o.   MRN: [id number]    Location:  [facility]   Place of Service: CLINIC   No Known Allergies  Chief Complaint  Patient presents with  . Acute Visit    choking episodes more frequent    HPI:  Increasing problems with choking when swallowing. Both solids and liquids. No pain. No nausea or vomiting. Symptoms started over a year ago. Denies heartburn or reflux.  Medications: Patient's Medications  New Prescriptions   No medications on file  Previous Medications   DOCUSATE SODIUM (COLACE) 100 MG CAPSULE    Take 100 mg by mouth daily as needed for mild constipation.    FINASTERIDE (PROSCAR) 5 MG TABLET    TAKE ONE DAILY TO HELP SHRINK THE PROSTATE   LATANOPROST (XALATAN) 0.005 % OPHTHALMIC SOLUTION    Place 1 drop into both eyes at bedtime.    MULTIPLE VITAMINS-MINERALS (CENTRUM) TABLET    Take 1 tablet by mouth daily.   PSYLLIUM (NATURAL FIBER LAXATIVE PO)    Take 1 tablet by mouth daily as needed (constipation). Use as directed   TAMSULOSIN (FLOMAX) 0.4 MG CAPS CAPSULE    Take 0.4 mg by mouth daily.   TIMOLOL (TIMOPTIC-XR) 0.5 % OPHTHALMIC GEL-FORMING    Place 1 drop into both eyes daily. One drop in each eye daily  Modified Medications   No medications on file  Discontinued Medications   FINASTERIDE (PROSCAR) 5 MG TABLET    Take 5 mg by mouth daily.     Review of Systems  Constitutional: Positive for activity change. Negative for fever, chills, appetite change and fatigue.  HENT: Negative.   Eyes: Negative.   Respiratory: Negative.   Cardiovascular: Negative for chest pain, palpitations and leg swelling.  Gastrointestinal:       Choking when swallowing.  Endocrine: Negative.   Genitourinary: Positive for urgency, frequency and difficulty urinating.       Hx of nodular prostate.  Musculoskeletal:       Miild discomfort in the left posterior knee. No  effusioin.  Skin: Negative.        History of herpes zoster in the  ophthalmic branch that is healed.  Neurological: Positive for dizziness. Negative for seizures, light-headedness, numbness and headaches.  Hematological: Negative.   Psychiatric/Behavioral: Negative.     Filed Vitals:   02/14/14 0823  BP: 112/50  Pulse: 76  Temp: 97.5 F (36.4 C)  TempSrc: Oral  Resp: 18  Weight: 177 lb (80.287 kg)   Body mass index is 27.72 kg/(m^2).  Physical Exam  Constitutional: He is oriented to person, place, and time. He appears well-developed and well-nourished. No distress.  HENT:  Head: Normocephalic and atraumatic.  Eyes:  Corrective lenses.  Neck: Neck supple. No JVD present. No tracheal deviation present. No thyromegaly present.  Cardiovascular: Normal rate, normal heart sounds and intact distal pulses.  Exam reveals no gallop and no friction rub.   No murmur heard. Irregular rhythm.  Pulmonary/Chest: No respiratory distress. He has no wheezes. He has no rales. He exhibits no tenderness.  Abdominal: He exhibits no distension and no mass. There is no tenderness.  Genitourinary:  Prostate is 2+ enlarged and nodular.  Musculoskeletal: Normal range of motion. He exhibits no edema and no tenderness.  Mild discomfort in the left posterior knee. No effusion. Gait instability.  Lymphadenopathy:    He has no cervical adenopathy.  Neurological: He is alert and oriented to person, place,  and time. No cranial nerve deficit. Coordination normal.  05/24/13 MMSE 28/30. Failed clock drawing. Normal vibratory sensation.  Skin: No rash noted. No erythema. No pallor.  Psychiatric: He has a normal mood and affect. His behavior is normal. Judgment and thought content normal.     Labs reviewed: No visits with results within 3 Month(s) from this visit. Latest known visit with results is:  Abstract on 05/23/2013  Component Date Value Ref Range Status  . HM Colonoscopy 06/10/2004 Colonoscopy Dr.Edwards   Final      Assessment/Plan  Dysphagia,  unspecified(787.20) - Plan: SLP modified barium swallow

## 2014-02-20 ENCOUNTER — Other Ambulatory Visit: Payer: Self-pay | Admitting: Internal Medicine

## 2014-02-23 ENCOUNTER — Ambulatory Visit (HOSPITAL_COMMUNITY)
Admission: RE | Admit: 2014-02-23 | Discharge: 2014-02-23 | Disposition: A | Discharge status: Home / Self Care | Payer: Medicare Other | Source: Ambulatory Visit | Attending: Internal Medicine | Admitting: Internal Medicine

## 2014-02-23 DIAGNOSIS — R1310 Dysphagia, unspecified: Secondary | ICD-10-CM

## 2014-02-23 NOTE — Procedures (Signed)
Objective Swallowing Evaluation: Bedside swallow evaluation  Patient Details  Name: Joel [name] MRN: [id number] Date of Birth: 1921/07/06  Today's Date: 02/23/2014 Time: 1400-1435 SLP Time Calculation (min): 35 min  Past Medical History:  Past Medical History  Diagnosis Date  . Nodular prostate without urinary obstruction 05/18/2012  . Herpes zoster 05/13/2011  . 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 Elevated prostate specific antigen (PSA) 05/13/2011  . Herpes zoster with ophthalmic complication 7/0/3500  . Unspecified constipation 09/24/2010  . Other and unspecified hyperlipidemia 09/20/2010  . Unspecified glaucoma 09/20/2010    bilateral  . Hypertrophy of prostate without urinary obstruction and other lower urinary tract symptoms (LUTS) 09/20/2010  . Osteoarthrosis, unspecified whether generalized or localized, unspecified site 09/20/2010  . Personal history of colonic polyps 09/20/2010  . Memory loss 03/25/2009   Past Surgical History:  Past Surgical History  Procedure Laterality Date  . Tonsillectomy  1938  . Appendectomy  1940  . Vasectomy  1972  . Colectomy  07/17/1997    sigmoid submucosal lipoma Dr. Oletta Lamas  . Cataract extraction w/ intraocular lens implant Right 2010    Dr. Ellie Lunch  . Tumor removal  1993    Removal of Benign Intestinal Tumor   . Skin cancer excision Right 05/19/2013    ear Dr. Dannette Barbara  . Colonoscopy  2000    normal  . Colonoscopy  04/21/2002    polypectomy  . Colonoscopy  06/10/2004    no polyps   HPI:    Pt is a 80 yo male referred by Dr Nyoka Cowden to assess for possible aspiration risk due to pt/spouse report of occasional choking with intake.  Pt PMH + for multiple falls starting approximately 18 months ago, memory loss, herpes zoster, glaucoma, OEA, prostate cancer, former smoker quit 1975.  Spouse present and reports pt to cough on food approximately twice a month.  He has not lost weight or required heimlich maneuver.  Spouse  reports pt with series of falls starting approx 18 months ago and shuffling gait noted since that time.  Pt denies reflux issues or symptoms of esophageal stasis.     Assessment / Plan / Recommendation Clinical Impression  Dysphagia Diagnosis: Mild oral phase dysphagia;Moderate pharyngeal phase dysphagia;Moderate cervical esophageal phase dysphagia   Suspect possible esophageal contribution - barium tablet lodged initially at UES then transited to mid- esophagus without sensation - required warm water- to clear - radiologist not present to confirm.   Clinical impression:   Pt presents with mild oral and moderate pharyngocervical esophageal dysphagia characterized by mildly decreased oral bolus propulsion/tongue base retraction contributing to mild amounts of vallecular/pharyngeal stasis.  Chin tuck posture helpful to prevent accumulation of stasis in pharynx of solids/pudding but unfortunately allowed trace aspiration of thin liquid (with cough response!).  Single boluses of  thin tolerated well without aspiration/penetration.  Cued dry swallows and liquid swallows effective to decrease vallecular stasis.     Advised to consider chin tuck with solids, following solids with liquids and taking medicine with pudding for maximal airway protection.  Spouse reports pt chokes approximately twice a month usually toward end of a meal and eats at rapid rate.  Using monitor during procedure SLP provided visual/verbal feedback to reinforce effective compensation strategies.    Please note, pt cleared his throat throughout the procedure without barium visualized in larynx/trachea.   Thanks for this referral.      Treatment Recommendation  No treatment recommended at this time    Diet Recommendation  Dysphagia 3 (Mechanical Soft);Thin liquid;Regular   Liquid Administration via: Cup;Straw Medication Administration: Whole meds with puree (start and follow with water) Supervision: Patient able to self  feed Compensations: Slow rate;Small sips/bites;Follow solids with liquid Postural Changes and/or Swallow Maneuvers: Chin tuck (tuck chin with foods only - NOT liquids)    Other  Recommendations Oral Care Recommendations: Oral care BID   Follow Up Recommendations  None      General Date of Onset: 02/23/14 Type of Study: Bedside swallow evaluation Reason for Referral: Objectively evaluate swallowing function Diet Prior to this Study: Regular;Thin liquids Temperature Spikes Noted: No Respiratory Status: Room air History of Recent Intubation: No Behavior/Cognition: Alert;Cooperative;Pleasant mood Oral Cavity - Dentition: Adequate natural dentition Oral Motor / Sensory Function: Within functional limits (? mild left decreased facial/labial movement, bilateral palatal elevation) Self-Feeding Abilities: Able to feed self Patient Positioning: Upright in chair Baseline Vocal Quality: Clear Volitional Cough: Strong Volitional Swallow: Able to elicit Anatomy: Within functional limits Pharyngeal Secretions: Not observed secondary MBS    Reason for Referral Objectively evaluate swallowing function   Oral Phase Oral Preparation/Oral Phase Oral Phase: Impaired Oral - Nectar Oral - Nectar Teaspoon: Weak lingual manipulation;Reduced posterior propulsion Oral - Nectar Cup: Weak lingual manipulation;Reduced posterior propulsion Oral - Thin Oral - Thin Teaspoon: Weak lingual manipulation;Reduced posterior propulsion Oral - Thin Cup: Weak lingual manipulation;Reduced posterior propulsion Oral - Thin Straw: Weak lingual manipulation;Reduced posterior propulsion Oral - Solids Oral - Puree: Weak lingual manipulation;Reduced posterior propulsion Oral - Regular: Weak lingual manipulation;Reduced posterior propulsion Oral - Pill: Weak lingual manipulation;Reduced posterior propulsion Oral Phase - Comment Oral Phase - Comment: mildly weak lingual manipulation/posterior oral propulsion   Pharyngeal  Phase Pharyngeal Phase Pharyngeal Phase: Impaired Pharyngeal - Nectar Pharyngeal - Nectar Teaspoon: Pharyngeal residue - valleculae;Reduced tongue base retraction Pharyngeal - Nectar Cup: Pharyngeal residue - valleculae;Reduced tongue base retraction Pharyngeal - Thin Pharyngeal - Thin Teaspoon: Pharyngeal residue - valleculae;Reduced tongue base retraction Pharyngeal - Thin Cup: Pharyngeal residue - valleculae;Reduced tongue base retraction Pharyngeal - Thin Straw: Pharyngeal residue - valleculae;Reduced tongue base retraction;Trace aspiration;Penetration/Aspiration after swallow (aspiration with chin tuck posture) Penetration/Aspiration details (thin straw): Material enters airway, passes BELOW cords then ejected out Pharyngeal - Solids Pharyngeal - Regular: Pharyngeal residue - valleculae;Reduced tongue base retraction Pharyngeal - Pill: Pharyngeal residue - valleculae;Reduced tongue base retraction Pharyngeal Phase - Comment Pharyngeal Comment: chin tuck helpful to prevent pharyngea/vallecular stasis, chin tuck allowed aspiration of thin liquids due to allowing pharyngeal stasis to spill into open larynx with overt coughing, dry swallows effectively decrease stasis   Cervical Esophageal Phase    GO    Cervical Esophageal Phase Cervical Esophageal Phase: Impaired Cervical Esophageal Phase - Nectar Nectar Teaspoon: Prominent cricopharyngeal segment;Reduced cricopharyngeal relaxation Nectar Cup: Reduced cricopharyngeal relaxation;Prominent cricopharyngeal segment Cervical Esophageal Phase - Thin Thin Teaspoon: Prominent cricopharyngeal segment;Reduced cricopharyngeal relaxation Thin Cup: Reduced cricopharyngeal relaxation;Prominent cricopharyngeal segment Thin Straw: Prominent cricopharyngeal segment;Reduced cricopharyngeal relaxation Cervical Esophageal Phase - Solids Puree: Reduced cricopharyngeal relaxation;Prominent cricopharyngeal segment Regular: Reduced cricopharyngeal  relaxation;Prominent cricopharyngeal segment Pill: Prominent cricopharyngeal segment;Reduced cricopharyngeal relaxation Cervical Esophageal Phase - Comment Cervical Esophageal Comment: barium tablet precariously lodged at UES clearing with further sips of liquids - advised pt to consider taking medicine with pudding - whole (start and follow with liquids)    Functional Assessment Tool Used: mbs, clinical judgement Functional Limitations: Swallowing Swallow Current Status ([id number]): At least 20 percent but less than 40 percent impaired, limited or restricted Swallow Goal Status [phone]): At least 20 percent  but less than 40 percent impaired, limited or restricted Swallow Discharge Status [phone]): At least 20 percent but less than 40 percent impaired, limited or restricted    Claudie Fisherman, [facility] SLP [phone]

## 2014-03-07 ENCOUNTER — Encounter: Payer: Self-pay | Admitting: Internal Medicine

## 2014-03-07 ENCOUNTER — Non-Acute Institutional Stay: Payer: Medicare Other | Admitting: Internal Medicine

## 2014-03-07 VITALS — BP 140/82 | HR 68 | Wt 176.0 lb

## 2014-03-07 DIAGNOSIS — R1310 Dysphagia, unspecified: Secondary | ICD-10-CM

## 2014-03-07 NOTE — Progress Notes (Signed)
Patient ID: Joel [name], male   DOB: 04-30-1921, 80 y.o.   MRN: [id number]    Location:  [facility] (12)    No Known Allergies  Chief Complaint  Patient presents with  . Medical Management of Chronic Issues    dyshagia, follow-up on swallowing test done 02/23/14 . With wife    HPI:  Dysphagia, unspecified(787.20); MBSS confirmed a dysphagia and risk for aspiration. He wasinsructed in chin tuck maneuver and exercise that may help his swallowing.    Medications: Patient's Medications  New Prescriptions   No medications on file  Previous Medications   DOCUSATE SODIUM (COLACE) 100 MG CAPSULE    Take 100 mg by mouth daily as needed for mild constipation.    FINASTERIDE (PROSCAR) 5 MG TABLET    TAKE ONE DAILY TO HELP SHRINK THE PROSTATE   LATANOPROST (XALATAN) 0.005 % OPHTHALMIC SOLUTION    Place 1 drop into both eyes at bedtime.    MULTIPLE VITAMINS-MINERALS (CENTRUM) TABLET    Take 1 tablet by mouth daily.   PSYLLIUM (NATURAL FIBER LAXATIVE PO)    Take 1 tablet by mouth daily as needed (constipation). Use as directed   TAMSULOSIN (FLOMAX) 0.4 MG CAPS CAPSULE    Take 0.4 mg by mouth daily.   TAMSULOSIN (FLOMAX) 0.4 MG CAPS CAPSULE    TAKE ONE CAPSULE  BY MOUTH ONCE  DAILY TO HELP PROSTATE AND TO REDUCE URINARY FREQUENCY   TIMOLOL (TIMOPTIC-XR) 0.5 % OPHTHALMIC GEL-FORMING    Place 1 drop into both eyes daily. One drop in each eye daily  Modified Medications   No medications on file  Discontinued Medications   No medications on file     Review of Systems  Constitutional: Positive for activity change. Negative for fever, chills, appetite change and fatigue.  HENT: Negative.   Eyes: Negative.   Respiratory: Negative.   Cardiovascular: Negative for chest pain, palpitations and leg swelling.  Gastrointestinal:       Choking when swallowing. Improved with instructions on chin tuck maneuver.  Endocrine: Negative.   Genitourinary: Positive  for urgency, frequency and difficulty urinating.       Hx of nodular prostate.  Musculoskeletal:       Miild discomfort in the left posterior knee. No  effusioin.  Skin: Negative.        History of herpes zoster in the ophthalmic branch that is healed.  Neurological: Positive for dizziness. Negative for seizures, light-headedness, numbness and headaches.  Hematological: Negative.   Psychiatric/Behavioral: Negative.     Filed Vitals:   03/07/14 1005  BP: 140/82  Pulse: 68  Weight: 176 lb (79.833 kg)   Body mass index is 27.56 kg/(m^2).  Physical Exam  Constitutional: He is oriented to person, place, and time. He appears well-developed and well-nourished. No distress.  HENT:  Head: Normocephalic and atraumatic.  Eyes:  Corrective lenses.  Neck: Neck supple. No JVD present. No tracheal deviation present. No thyromegaly present.  Cardiovascular: Normal rate, normal heart sounds and intact distal pulses.  Exam reveals no gallop and no friction rub.   No murmur heard. Irregular rhythm.  Pulmonary/Chest: No respiratory distress. He has no wheezes. He has no rales. He exhibits no tenderness.  Abdominal: He exhibits no distension and no mass. There is no tenderness.  Genitourinary:  Prostate is 2+ enlarged and nodular.  Musculoskeletal: Normal range of motion. He exhibits no edema and no tenderness.  Mild discomfort in the left posterior  knee. No effusion. Gait instability.  Lymphadenopathy:    He has no cervical adenopathy.  Neurological: He is alert and oriented to person, place, and time. No cranial nerve deficit. Coordination normal.  05/24/13 MMSE 28/30. Failed clock drawing. Normal vibratory sensation.  Skin: No rash noted. No erythema. No pallor.  Psychiatric: He has a normal mood and affect. His behavior is normal. Judgment and thought content normal.     Labs reviewed: No visits with results within 3 Month(s) from this visit. Latest known visit with results  is:  Abstract on 05/23/2013  Component Date Value Ref Range Status  . HM Colonoscopy 06/10/2004 Colonoscopy Dr.Edwards   Final      Assessment/Plan  1. Dysphagia, unspecified(787.20) Continue to do exercises and the chin tuck maneuver.

## 2014-04-18 ENCOUNTER — Other Ambulatory Visit: Payer: Self-pay | Admitting: Internal Medicine

## 2014-05-15 LAB — HEPATIC FUNCTION PANEL
ALT: 11 U/L (ref 10–40)
AST: 20 U/L (ref 14–40)
Alkaline Phosphatase: 66 U/L (ref 25–125)
BILIRUBIN, TOTAL: 0.5 mg/dL

## 2014-05-15 LAB — BASIC METABOLIC PANEL
BUN: 18 mg/dL (ref 4–21)
Creatinine: 1 mg/dL (ref 0.6–1.3)
Glucose: 84 mg/dL
Potassium: 4.4 mmol/L (ref 3.4–5.3)
SODIUM: 138 mmol/L (ref 137–147)

## 2014-05-15 LAB — LIPID PANEL
CHOLESTEROL: 183 mg/dL (ref 0–200)
HDL: 42 mg/dL (ref 35–70)
LDL CALC: 118 mg/dL
Triglycerides: 114 mg/dL (ref 40–160)

## 2014-05-15 LAB — TSH: TSH: 2.74 u[IU]/mL (ref 0.41–5.90)

## 2014-05-23 ENCOUNTER — Encounter: Payer: Self-pay | Admitting: Internal Medicine

## 2014-05-23 ENCOUNTER — Non-Acute Institutional Stay: Payer: Medicare Other | Admitting: Internal Medicine

## 2014-05-23 VITALS — BP 122/72 | HR 60 | Temp 97.3°F | Ht 65.0 in | Wt 174.0 lb

## 2014-05-23 DIAGNOSIS — R1310 Dysphagia, unspecified: Secondary | ICD-10-CM

## 2014-05-23 DIAGNOSIS — R413 Other amnesia: Secondary | ICD-10-CM

## 2014-05-23 DIAGNOSIS — R42 Dizziness and giddiness: Secondary | ICD-10-CM

## 2014-05-23 DIAGNOSIS — E785 Hyperlipidemia, unspecified: Secondary | ICD-10-CM

## 2014-05-23 DIAGNOSIS — R972 Elevated prostate specific antigen [PSA]: Secondary | ICD-10-CM

## 2014-05-23 DIAGNOSIS — M1990 Unspecified osteoarthritis, unspecified site: Secondary | ICD-10-CM

## 2014-05-23 NOTE — Progress Notes (Addendum)
Passed clock drawing 

## 2014-06-15 ENCOUNTER — Encounter: Payer: Self-pay | Admitting: Internal Medicine

## 2014-06-20 ENCOUNTER — Other Ambulatory Visit: Payer: Self-pay | Admitting: Internal Medicine

## 2014-09-12 ENCOUNTER — Other Ambulatory Visit: Payer: Self-pay | Admitting: Internal Medicine

## 2014-10-18 ENCOUNTER — Other Ambulatory Visit: Payer: Self-pay | Admitting: Internal Medicine

## 2014-10-19 ENCOUNTER — Other Ambulatory Visit: Payer: Self-pay | Admitting: *Deleted

## 2014-10-19 MED ORDER — FINASTERIDE 5 MG PO TABS: ORAL_TABLET | ORAL | Status: DC

## 2014-10-19 NOTE — Telephone Encounter (Signed)
Joel [name] 

## 2014-10-30 NOTE — Addendum Note (Signed)
Addended by: [name] on: 10/30/2014 04:44 PM   Modules accepted: Level of Service

## 2014-10-30 NOTE — Progress Notes (Signed)
HISTORY AND PHYSICAL  Location:  [place] of Service: Clinic (12)   Extended Emergency Contact Information Primary Emergency Contact: Joel [name] Asc Address: [address] [place],  [place] Home Phone: [phone] Relation: None  Advanced Directive information Does patient have an advance directive?: Yes, Type of Advance Directive: Healthcare Power of [place];Living will  Chief Complaint  Patient presents with  . Medical Management of Chronic Issues    Coprehensive exam: cholesterol, memory   . Dizziness    some off and on     HPI:  Memory loss: Patient remains functional in the independent living environment with his wife watching over him. MMSE result was 28/30. He did pass the clock drawing test. He is aware that his memory is slipping.  Dysphagia, unspecified(787.20): Mild cough at meals.   MBSS done 02/23/14 : Pt presents with mild oral and moderate pharyngocervical esophageal dysphagia characterized by mildly decreased oral bolus propulsion/tongue base retraction contributing to mild amounts of  vallecular/pharyngeal stasis. Chin tuck posture helpful to prevent accumulation of stasis in pharynx of solids/pudding but unfortunately allowed trace aspiration of thin liquid (with cough response!). Single boluses of thin tolerated well without aspiration/penetration. Cued dry swallows and liquid swallows  effective to decrease vallecular stasis.  Dizzy: Episodic and may be related to positional changes  Elevated prostate specific antigen (PSA): 2.70 on 11/14/13  Osteoarthrosis, unspecified whether generalized or localized, unspecified site: Generalized and only mildly uncomfortable. Does not interfere with gait or activities.  Other and unspecified hyperlipidemia: Controlled    Past Medical History  Diagnosis Date  . Nodular prostate without urinary obstruction 05/18/2012  . Herpes zoster 05/13/2011  . 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 Elevated prostate specific antigen (PSA) 05/13/2011  . Herpes zoster with ophthalmic complication 10/05/735  . Unspecified constipation 09/24/2010  . Other and unspecified hyperlipidemia 09/20/2010  . Unspecified glaucoma 09/20/2010    bilateral  . Hypertrophy of prostate without urinary obstruction and other lower urinary tract symptoms (LUTS) 09/20/2010  . Osteoarthrosis, unspecified whether generalized or localized, unspecified site 09/20/2010  . Personal history of colonic polyps 09/20/2010  . Memory loss 03/25/2009    Past Surgical History  Procedure Laterality Date  . Tonsillectomy  1938  . Appendectomy  1940  . Vasectomy  1972  . Colectomy  07/17/1997    sigmoid submucosal lipoma Dr. Oletta Lamas  . Cataract extraction w/ intraocular lens implant Right 2010    Dr. Ellie Lunch  . Tumor removal  1993    Removal of Benign Intestinal Tumor   . Skin cancer excision Right 05/19/2013    ear Dr. Dannette Barbara  . Colonoscopy  2000    normal  . Colonoscopy  04/21/2002    polypectomy  . Colonoscopy  06/10/2004    no polyps    Patient Care Team: [name], MD as PCP - General (Internal Medicine) [facility] Roselie Awkward, MD as Consulting Physician (Ophthalmology) Winfield Cunas., MD as Consulting Physician (Gastroenterology) Claybon Jabs, MD as Consulting Physician (Urology) [name], MD as Consulting Physician (Dermatology)  History   Social History  . Marital Status: Married    Spouse Name: N/A    Number of Children: N/A  . Years of Education: N/A   Occupational History  . retired Medical illustrator    Social History Main Topics  . Smoking status: Former Audiological scientist  date: 04/12/1974  . Smokeless tobacco: Never Used  . Alcohol Use: Yes     Comment: wine occasionaly  . Drug Use: No  . Sexual Activity: Not on file   Other Topics Concern  . Not on file   Social History Narrative   Lives at [facility] with wife, moved in  02/10/2006     reports that he quit smoking about 40 years ago. He has never used smokeless tobacco. He reports that he drinks alcohol. He reports that he does not use illicit drugs.  Family History  Problem Relation Age of Onset  . Heart disease Father    Family Status  Relation Status Death Age  . Mother Deceased     Unknown cause of death   . Father Deceased     Heart Disease   . Daughter Alive   . Son Alive   . Son Alive   . Son Alive   . Brother Deceased     Heart Disease  . Sister Deceased     Heart Disease   . Brother Deceased     Lung Cancer   . Brother Deceased     Lung Cancer   . Sister Deceased     Lung Cancer     Immunization History  Administered Date(s) Administered  . Influenza Whole 06/29/2012, 06/30/2013  . Pneumococcal Polysaccharide-23 09/30/1995  . Td 09/29/1994  . Zoster 09/30/2007    No Known Allergies  Medications: Patient's Medications  New Prescriptions   No medications on file  Previous Medications   DOCUSATE SODIUM (COLACE) 100 MG CAPSULE    Take 100 mg by mouth daily as needed for mild constipation.    LATANOPROST (XALATAN) 0.005 % OPHTHALMIC SOLUTION    Place 1 drop into both eyes at bedtime.    MULTIPLE VITAMINS-MINERALS (CENTRUM) TABLET    Take 1 tablet by mouth daily.   TIMOLOL (TIMOPTIC-XR) 0.5 % OPHTHALMIC GEL-FORMING    Place 1 drop into both eyes daily. One drop in each eye daily  Modified Medications   Modified Medication Previous Medication   FINASTERIDE (PROSCAR) 5 MG TABLET finasteride (PROSCAR) 5 MG tablet      Take one tablet by mouth once daily to shrink the prostate    TAKE ONE DAILY TO HELP SHRINK THE PROSTATE   TAMSULOSIN (FLOMAX) 0.4 MG CAPS CAPSULE tamsulosin (FLOMAX) 0.4 MG CAPS capsule      TAKE ONE CAPSULE  BY MOUTH ONCE  DAILY TO HELP PROSTATE AND TO REDUCE URINARY FREQUENCY in evening    TAKE ONE CAPSULE  BY MOUTH ONCE  DAILY TO HELP PROSTATE AND TO REDUCE URINARY FREQUENCY   TAMSULOSIN (FLOMAX) 0.4 MG CAPS  CAPSULE tamsulosin (FLOMAX) 0.4 MG CAPS capsule      TAKE ONE CAPSULE  BY MOUTH ONCE  DAILY TO HELP PROSTATE AND TO REDUCE URINARY FREQUENCY    TAKE ONE CAPSULE  BY MOUTH ONCE  DAILY TO HELP PROSTATE AND TO REDUCE URINARY FREQUENCY  Discontinued Medications   FINASTERIDE (PROSCAR) 5 MG TABLET    TAKE ONE DAILY TO HELP SHRINK THE PROSTATE in morning   PSYLLIUM (NATURAL FIBER LAXATIVE PO)    Take 1 tablet by mouth daily as needed (constipation). Use as directed   TAMSULOSIN (FLOMAX) 0.4 MG CAPS CAPSULE    Take 0.4 mg by mouth daily.    Review of Systems  Constitutional: Positive for activity change. Negative for fever, chills, appetite change and fatigue.  HENT: Negative.   Eyes: Negative.  Respiratory: Negative.   Cardiovascular: Negative for chest pain, palpitations and leg swelling.  Gastrointestinal:       Choking when swallowing. Improved with instructions on chin tuck maneuver.  Endocrine: Negative.   Genitourinary: Positive for urgency, frequency and difficulty urinating.       Hx of nodular prostate.  Musculoskeletal:       Miild discomfort in the left posterior knee. No  effusioin.  Skin: Negative.        History of herpes zoster in the ophthalmic branch that is healed.  Neurological: Positive for dizziness. Negative for seizures, light-headedness, numbness and headaches.  Hematological: Negative.   Psychiatric/Behavioral: Negative.     Filed Vitals:   05/23/14 1045  BP: 122/72  Pulse: 60  Temp: 97.3 F (36.3 C)  TempSrc: Oral  Height: 5\' 5"  (1.651 m)  Weight: 174 lb (78.926 kg)   Body mass index is 28.96 kg/(m^2).  Physical Exam  Constitutional: He is oriented to person, place, and time. He appears well-developed and well-nourished. No distress.  HENT:  Head: Normocephalic and atraumatic.  Eyes:  Corrective lenses.  Neck: Neck supple. No JVD present. No tracheal deviation present. No thyromegaly present.  Cardiovascular: Normal rate, normal heart sounds and  intact distal pulses.  Exam reveals no gallop and no friction rub.   No murmur heard. Irregular rhythm.  Pulmonary/Chest: No respiratory distress. He has no wheezes. He has no rales. He exhibits no tenderness.  Abdominal: He exhibits no distension and no mass. There is no tenderness.  Genitourinary:  Prostate is 2+ enlarged and nodular.  Musculoskeletal: Normal range of motion. He exhibits no edema or tenderness.  Mild discomfort in the left posterior knee. No effusion. Gait instability.  Lymphadenopathy:    He has no cervical adenopathy.  Neurological: He is alert and oriented to person, place, and time. No cranial nerve deficit. Coordination normal.  05/24/13 MMSE 28/30. Failed clock drawing. Normal vibratory sensation.  Skin: No rash noted. No erythema. No pallor.  Psychiatric: He has a normal mood and affect. His behavior is normal. Judgment and thought content normal.     Labs reviewed: Nursing Home on 05/23/2014  Component Date Value Ref Range Status  . Glucose 05/15/2014 84   Final  . BUN 05/15/2014 18  4 - 21 mg/dL Final  . Creatinine 05/15/2014 1.0  0.6 - 1.3 mg/dL Final  . Potassium 05/15/2014 4.4  3.4 - 5.3 mmol/L Final  . Sodium 05/15/2014 138  137 - 147 mmol/L Final  . Triglycerides 05/15/2014 114  40 - 160 mg/dL Final  . Cholesterol 05/15/2014 183  0 - 200 mg/dL Final  . HDL 05/15/2014 42  35 - 70 mg/dL Final  . LDL Cholesterol 05/15/2014 118   Final  . Alkaline Phosphatase 05/15/2014 66  25 - 125 U/L Final  . ALT 05/15/2014 11  10 - 40 U/L Final  . AST 05/15/2014 20  14 - 40 U/L Final  . Bilirubin, Total 05/15/2014 0.5   Final  . TSH 05/15/2014 2.74  0.41 - 5.90 uIU/mL Final     Assessment/Plan 1. Memory loss Age-related  2. Dysphagia, unspecified(787.20) Continue with chin tuck maneuvers  3. Dizzy Observe. No new medications  4. Elevated prostate specific antigen (PSA) Recheck in the next year  5. Osteoarthrosis, unspecified whether generalized or  localized, unspecified site Continue with mild pain relievers as needed  6. Other and unspecified hyperlipidemia Controlled

## 2014-11-21 ENCOUNTER — Non-Acute Institutional Stay: Payer: PPO | Admitting: Internal Medicine

## 2014-11-21 ENCOUNTER — Encounter: Payer: Self-pay | Admitting: Internal Medicine

## 2014-11-21 VITALS — BP 118/60 | HR 68 | Temp 97.6°F | Wt 175.0 lb

## 2014-11-21 DIAGNOSIS — M25512 Pain in left shoulder: Secondary | ICD-10-CM | POA: Insufficient documentation

## 2014-11-21 DIAGNOSIS — R1314 Dysphagia, pharyngoesophageal phase: Secondary | ICD-10-CM

## 2014-11-21 DIAGNOSIS — R413 Other amnesia: Secondary | ICD-10-CM

## 2014-11-21 NOTE — Progress Notes (Signed)
Patient ID: Joel [name], male   DOB: 23-Jul-1921, 80 y.o.   MRN: [id number]    [place]    Place of Service: Clinic (12) OFFICE   No Known Allergies  Chief Complaint  Patient presents with  . Medical Management of Chronic Issues    memory, dysphagina, arthritis    HPI:  Pain in left shoulder: better if he uses ibuprofen. No recent known strain or injury. Pain has been present more or less for several years.  Dysphagia, pharyngoesophageal phase: rare episodes of choking.  Memory loss: unchanged    Medications: Patient's Medications  New Prescriptions   No medications on file  Previous Medications   DOCUSATE SODIUM (COLACE) 100 MG CAPSULE    Take 100 mg by mouth daily as needed for mild constipation.    DORZOLAMIDE (TRUSOPT) 2 % OPHTHALMIC SOLUTION    Place 1 drop into both eyes 2 (two) times daily.   FINASTERIDE (PROSCAR) 5 MG TABLET    Take one tablet by mouth once daily to shrink the prostate   LATANOPROST (XALATAN) 0.005 % OPHTHALMIC SOLUTION    Place 1 drop into both eyes at bedtime.    MULTIPLE VITAMINS-MINERALS (CENTRUM) TABLET    Take 1 tablet by mouth daily.   TAMSULOSIN (FLOMAX) 0.4 MG CAPS CAPSULE    TAKE ONE CAPSULE  BY MOUTH ONCE  DAILY TO HELP PROSTATE AND TO REDUCE URINARY FREQUENCY in evening  Modified Medications   No medications on file  Discontinued Medications   TAMSULOSIN (FLOMAX) 0.4 MG CAPS CAPSULE    TAKE ONE CAPSULE  BY MOUTH ONCE  DAILY TO HELP PROSTATE AND TO REDUCE URINARY FREQUENCY   TIMOLOL (TIMOPTIC-XR) 0.5 % OPHTHALMIC GEL-FORMING    Place 1 drop into both eyes daily. One drop in each eye daily     Review of Systems  Constitutional: Positive for activity change. Negative for fever, chills, appetite change and fatigue.  HENT: Negative.   Eyes: Negative.   Respiratory: Negative.   Cardiovascular: Negative for chest pain, palpitations and leg swelling.  Gastrointestinal:       Choking when swallowing. Improved with  instructions on chin tuck maneuver.  Endocrine: Negative.   Genitourinary: Positive for urgency, frequency and difficulty urinating.       Hx of nodular prostate.  Musculoskeletal:       Miild discomfort in the left posterior knee. No  Effusion. Left shoulder pain when he lays on it.  Skin: Negative.        History of herpes zoster in the ophthalmic branch that is healed.  Neurological: Positive for dizziness. Negative for seizures, light-headedness, numbness and headaches.       Memory loss  Hematological: Negative.   Psychiatric/Behavioral: Negative.     Filed Vitals:   11/21/14 0946  BP: 118/60  Pulse: 68  Temp: 97.6 F (36.4 C)  TempSrc: Oral  Weight: 175 lb (79.379 kg)   Body mass index is 29.12 kg/(m^2).  Physical Exam  Constitutional: He is oriented to person, place, and time. He appears well-developed and well-nourished. No distress.  HENT:  Head: Normocephalic and atraumatic.  Eyes:  Corrective lenses.  Neck: Neck supple. No JVD present. No tracheal deviation present. No thyromegaly present.  Cardiovascular: Normal rate, normal heart sounds and intact distal pulses.  Exam reveals no gallop and no friction rub.   No murmur heard. Irregular rhythm.  Pulmonary/Chest: No respiratory distress. He has no wheezes. He has no rales. He exhibits no tenderness.  Abdominal: He exhibits no distension and no mass. There is no tenderness.  Genitourinary:  Prostate is 2+ enlarged and nodular.  Musculoskeletal: Normal range of motion. He exhibits no edema or tenderness.  Mild discomfort in the left posterior knee. No effusion. Gait instability. Full range of motion of the left shoulder. Unable to locate a point of tenderness.  Lymphadenopathy:    He has no cervical adenopathy.  Neurological: He is alert and oriented to person, place, and time. No cranial nerve deficit. Coordination normal.  05/24/13 MMSE 28/30. Failed clock drawing. Normal vibratory sensation.  Skin: No rash  noted. No erythema. No pallor.  Psychiatric: He has a normal mood and affect. His behavior is normal. Judgment and thought content normal.     Labs reviewed: No visits with results within 3 Month(s) from this visit. Latest known visit with results is:  Nursing Home on 05/23/2014  Component Date Value Ref Range Status  . Glucose 05/15/2014 84   Final  . BUN 05/15/2014 18  4 - 21 mg/dL Final  . Creatinine 05/15/2014 1.0  0.6 - 1.3 mg/dL Final  . Potassium 05/15/2014 4.4  3.4 - 5.3 mmol/L Final  . Sodium 05/15/2014 138  137 - 147 mmol/L Final  . Triglycerides 05/15/2014 114  40 - 160 mg/dL Final  . Cholesterol 05/15/2014 183  0 - 200 mg/dL Final  . HDL 05/15/2014 42  35 - 70 mg/dL Final  . LDL Cholesterol 05/15/2014 118   Final  . Alkaline Phosphatase 05/15/2014 66  25 - 125 U/L Final  . ALT 05/15/2014 11  10 - 40 U/L Final  . AST 05/15/2014 20  14 - 40 U/L Final  . Bilirubin, Total 05/15/2014 0.5   Final  . TSH 05/15/2014 2.74  0.41 - 5.90 uIU/mL Final     Assessment/Plan  Pain in left shoulder -contiinue ibuprofen prn  Dysphagia, pharyngoesophageal phase Observe  Memory loss Observe

## 2014-12-15 DIAGNOSIS — M79605 Pain in left leg: Secondary | ICD-10-CM | POA: Insufficient documentation

## 2014-12-20 ENCOUNTER — Ambulatory Visit
Admission: RE | Admit: 2014-12-20 | Discharge: 2014-12-20 | Disposition: A | Discharge status: Home / Self Care | Payer: PPO | Source: Ambulatory Visit | Attending: Internal Medicine | Admitting: Internal Medicine

## 2014-12-20 ENCOUNTER — Encounter: Payer: Self-pay | Admitting: Internal Medicine

## 2014-12-20 ENCOUNTER — Ambulatory Visit (INDEPENDENT_AMBULATORY_CARE_PROVIDER_SITE_OTHER): Payer: PPO | Admitting: Internal Medicine

## 2014-12-20 ENCOUNTER — Other Ambulatory Visit: Payer: Self-pay | Admitting: Internal Medicine

## 2014-12-20 VITALS — BP 122/74 | HR 57 | Temp 97.7°F | Ht 65.0 in | Wt 173.6 lb

## 2014-12-20 DIAGNOSIS — R413 Other amnesia: Secondary | ICD-10-CM

## 2014-12-20 DIAGNOSIS — M79605 Pain in left leg: Secondary | ICD-10-CM | POA: Diagnosis not present

## 2014-12-20 NOTE — Progress Notes (Signed)
Patient ID: Joel [name], male   DOB: Feb 21, 1921, 81 y.o.   MRN: [id number]    Facility  [facility]    Place of Service:   OFFICE   No Known Allergies  Chief Complaint  Patient presents with  . Acute Visit    Patient missed chair and fell and has some pain in lower back.    HPI:  Slid out of chair 11/29/16/16. Pain inn the left buttock and upper leg. Now having more trouble since his fall due to pain.  Having more instability in walking and onset of hand tremor for the last seeeral months. Pace of deterioration accelerated about 2 months ago. Memory los also seems worse per wife.  Medications: Patient's Medications  New Prescriptions   No medications on file  Previous Medications   DOCUSATE SODIUM (COLACE) 100 MG CAPSULE    Take 100 mg by mouth daily as needed for mild constipation.    DORZOLAMIDE (TRUSOPT) 2 % OPHTHALMIC SOLUTION    Place 1 drop into both eyes 2 (two) times daily.   FINASTERIDE (PROSCAR) 5 MG TABLET    Take one tablet by mouth once daily to shrink the prostate   LATANOPROST (XALATAN) 0.005 % OPHTHALMIC SOLUTION    Place 1 drop into both eyes at bedtime.    MULTIPLE VITAMINS-MINERALS (CENTRUM) TABLET    Take 1 tablet by mouth daily.   TAMSULOSIN (FLOMAX) 0.4 MG CAPS CAPSULE    TAKE ONE CAPSULE  BY MOUTH ONCE  DAILY TO HELP PROSTATE AND TO REDUCE URINARY FREQUENCY in evening  Modified Medications   No medications on file  Discontinued Medications   No medications on file     Review of Systems  Constitutional: Positive for activity change. Negative for fever, chills, appetite change and fatigue.  HENT: Negative.   Eyes: Negative.   Respiratory: Negative.   Cardiovascular: Negative for chest pain, palpitations and leg swelling.  Gastrointestinal:       Choking when swallowing. Improved with instructions on chin tuck maneuver.  Endocrine: Negative.   Genitourinary: Positive for urgency, frequency and difficulty urinating.       Hx of nodular prostate.    Musculoskeletal:       Miild discomfort in the left posterior knee. No  Effusion. Left shoulder pain when he lays on it. Pain in the left buttock and hip  Skin: Negative.        History of herpes zoster in the ophthalmic branch that is healed.  Neurological: Positive for dizziness. Negative for seizures, light-headedness, numbness and headaches.       Memory loss  Hematological: Negative.   Psychiatric/Behavioral: Negative.     Filed Vitals:   12/20/14 1138  BP: 122/74  Pulse: 57  Temp: 97.7 F (36.5 C)  TempSrc: Oral  Height: 5\' 5"  (1.651 m)  Weight: 173 lb 9.6 oz (78.744 kg)   Body mass index is 28.89 kg/(m^2).  Physical Exam  Constitutional: He is oriented to person, place, and time. He appears well-developed and well-nourished. No distress.  HENT:  Head: Normocephalic and atraumatic.  Eyes:  Corrective lenses.  Neck: Neck supple. No JVD present. No tracheal deviation present. No thyromegaly present.  Cardiovascular: Normal rate, normal heart sounds and intact distal pulses.  Exam reveals no gallop and no friction rub.   No murmur heard. Irregular rhythm.  Pulmonary/Chest: No respiratory distress. He has no wheezes. He has no rales. He exhibits no tenderness.  Abdominal: He exhibits no distension and no mass. There is  no tenderness.  Genitourinary:  Prostate is 2+ enlarged and nodular.  Musculoskeletal: Normal range of motion. He exhibits no edema or tenderness.  Mild discomfort in the left posterior knee. No effusion. Gait instability. Full range of motion of the left shoulder. Tender left buttock and hip. Halting gsait. Left foot externally deviated when walking.  Lymphadenopathy:    He has no cervical adenopathy.  Neurological: He is alert and oriented to person, place, and time. No cranial nerve deficit. Coordination normal.  05/24/13 MMSE 28/30. Failed clock drawing. Normal vibratory sensation.  Skin: No rash noted. No erythema. No pallor.  Psychiatric: He has  a normal mood and affect. His behavior is normal. Judgment and thought content normal.     Labs reviewed: No visits with results within 3 Month(s) from this visit. Latest known visit with results is:  Nursing Home on 05/23/2014  Component Date Value Ref Range Status  . Glucose 05/15/2014 84   Final  . BUN 05/15/2014 18  4 - 21 mg/dL Final  . Creatinine 05/15/2014 1.0  0.6 - 1.3 mg/dL Final  . Potassium 05/15/2014 4.4  3.4 - 5.3 mmol/L Final  . Sodium 05/15/2014 138  137 - 147 mmol/L Final  . Triglycerides 05/15/2014 114  40 - 160 mg/dL Final  . Cholesterol 05/15/2014 183  0 - 200 mg/dL Final  . HDL 05/15/2014 42  35 - 70 mg/dL Final  . LDL Cholesterol 05/15/2014 118   Final  . Alkaline Phosphatase 05/15/2014 66  25 - 125 U/L Final  . ALT 05/15/2014 11  10 - 40 U/L Final  . AST 05/15/2014 20  14 - 40 U/L Final  . Bilirubin, Total 05/15/2014 0.5   Final  . TSH 05/15/2014 2.74  0.41 - 5.90 uIU/mL Final     Assessment/Plan 1. Pain of left leg - DG HIP OPERATIVE UNILAT WITH PELVIS LEFT; Future  2. Memory loss monitor

## 2014-12-20 NOTE — Patient Instructions (Signed)
Go to the [facility] on [address]

## 2014-12-26 ENCOUNTER — Encounter: Payer: PPO | Admitting: Internal Medicine

## 2015-01-11 ENCOUNTER — Encounter: Payer: Self-pay | Admitting: Internal Medicine

## 2015-01-31 ENCOUNTER — Other Ambulatory Visit (HOSPITAL_COMMUNITY): Payer: Self-pay | Admitting: Dermatology

## 2015-02-07 ENCOUNTER — Other Ambulatory Visit: Payer: Self-pay | Admitting: Internal Medicine

## 2015-02-08 ENCOUNTER — Other Ambulatory Visit: Payer: Self-pay | Admitting: *Deleted

## 2015-02-08 MED ORDER — FINASTERIDE 5 MG PO TABS: ORAL_TABLET | ORAL | Status: DC

## 2015-02-08 NOTE — Telephone Encounter (Signed)
[employer]

## 2015-02-20 ENCOUNTER — Ambulatory Visit
Admission: RE | Admit: 2015-02-20 | Discharge: 2015-02-20 | Disposition: A | Discharge status: Home / Self Care | Payer: PPO | Source: Ambulatory Visit | Attending: Internal Medicine | Admitting: Internal Medicine

## 2015-02-20 ENCOUNTER — Non-Acute Institutional Stay: Payer: PPO | Admitting: Internal Medicine

## 2015-02-20 ENCOUNTER — Encounter: Payer: Self-pay | Admitting: Internal Medicine

## 2015-02-20 DIAGNOSIS — W19XXXA Unspecified fall, initial encounter: Secondary | ICD-10-CM | POA: Insufficient documentation

## 2015-02-20 DIAGNOSIS — M545 Low back pain, unspecified: Secondary | ICD-10-CM

## 2015-02-20 DIAGNOSIS — M5489 Other dorsalgia: Secondary | ICD-10-CM | POA: Diagnosis not present

## 2015-02-20 NOTE — Progress Notes (Signed)
Patient ID: Joel [name], male   DOB: 1921/05/17, 81 y.o.   MRN: [id number]    [facility]    Place of Service:   OFFICE    No Known Allergies  Chief Complaint  Patient presents with  . Fall    3 times in past 4 weeks, falls backwards.  1st time was going up steps on bus, fell backwards. Making bed, fell backwards, next day the same thing. Wasn't dizzy. c/o back pain. Here with wife    HPI:  Fall, initial encounter: Loses balance and falls backwards. Denies syncopal features to the falls. He is not in any particular pain prior to falling. Hips and knees do not give way.  Lumbosacral pain - since his falls, he has had increasing pain in the lumbar sacral area. It does not radiate into the legs. There has been no disturbance of bladder or bowel function compared to the past.    Medications: Patient's Medications  New Prescriptions   No medications on file  Previous Medications   DOCUSATE SODIUM (COLACE) 100 MG CAPSULE    Take 100 mg by mouth daily as needed for mild constipation.    DORZOLAMIDE (TRUSOPT) 2 % OPHTHALMIC SOLUTION    Place 1 drop into both eyes 2 (two) times daily.   FINASTERIDE (PROSCAR) 5 MG TABLET    Take one tablet by mouth once daily to shrink the prostate   LATANOPROST (XALATAN) 0.005 % OPHTHALMIC SOLUTION    Place 1 drop into both eyes at bedtime.    MULTIPLE VITAMINS-MINERALS (CENTRUM) TABLET    Take 1 tablet by mouth daily.   TAMSULOSIN (FLOMAX) 0.4 MG CAPS CAPSULE    TAKE ONE CAPSULE  BY MOUTH ONCE  DAILY TO HELP PROSTATE AND TO REDUCE URINARY FREQUENCY in evening  Modified Medications   No medications on file  Discontinued Medications   No medications on file     Review of Systems  Constitutional: Positive for activity change. Negative for fever, chills, appetite change and fatigue.  HENT: Negative.   Eyes: Negative.   Respiratory: Negative.   Cardiovascular: Negative for chest pain, palpitations and leg swelling.    Gastrointestinal:       Choking when swallowing. Improved with instructions on chin tuck maneuver.  Endocrine: Negative.   Genitourinary: Positive for urgency, frequency and difficulty urinating.       Hx of nodular prostate.  Musculoskeletal: Positive for gait problem.       Miild discomfort in the left posterior knee. No  Effusion. Left shoulder pain when he lays on it. History compression fracture at T12 in 2005. Increased falls in May 2016. Pain in low back and lumbosacral area.  Skin: Negative.        History of herpes zoster in the ophthalmic branch that is healed.  Neurological: Positive for dizziness. Negative for seizures, light-headedness, numbness and headaches.       Memory loss  Hematological: Negative.   Psychiatric/Behavioral: Negative.     Filed Vitals:   02/20/15 1152  BP: 116/60  Pulse: 60  Temp: 97.7 F (36.5 C)  TempSrc: Oral  Weight: 171 lb (77.565 kg)  SpO2: 97%   Body mass index is 28.46 kg/(m^2).  Physical Exam  Constitutional: He is oriented to person, place, and time. He appears well-developed and well-nourished. No distress.  HENT:  Head: Normocephalic and atraumatic.  Eyes:  Corrective lenses.  Neck: Neck supple. No JVD present. No tracheal deviation present. No thyromegaly present.  Cardiovascular:  Normal rate, normal heart sounds and intact distal pulses.  Exam reveals no gallop and no friction rub.   No murmur heard. Irregular rhythm.  Pulmonary/Chest: No respiratory distress. He has no wheezes. He has no rales. He exhibits no tenderness.  Abdominal: He exhibits no distension and no mass. There is no tenderness.  Genitourinary:  Prostate is 2+ enlarged and nodular.  Musculoskeletal: Normal range of motion. He exhibits no edema or tenderness.  Mild discomfort in the left posterior knee. No effusion. Gait instability. Full range of motion of the left shoulder. Tender left buttock and hip. Halting gsait. Left foot externally deviated when  walking. Plan at lumbosacral area midline.  Lymphadenopathy:    He has no cervical adenopathy.  Neurological: He is alert and oriented to person, place, and time. No cranial nerve deficit. Coordination normal.  05/24/13 MMSE 28/30. Failed clock drawing. Normal vibratory sensation.  Skin: No rash noted. No erythema. No pallor.  Psychiatric: He has a normal mood and affect. His behavior is normal. Judgment and thought content normal.     Labs reviewed: No visits with results within 3 Month(s) from this visit. Latest known visit with results is:  Nursing Home on 05/23/2014  Component Date Value Ref Range Status  . Glucose 05/15/2014 84   Final  . BUN 05/15/2014 18  4 - 21 mg/dL Final  . Creatinine 05/15/2014 1.0  0.6 - 1.3 mg/dL Final  . Potassium 05/15/2014 4.4  3.4 - 5.3 mmol/L Final  . Sodium 05/15/2014 138  137 - 147 mmol/L Final  . Triglycerides 05/15/2014 114  40 - 160 mg/dL Final  . Cholesterol 05/15/2014 183  0 - 200 mg/dL Final  . HDL 05/15/2014 42  35 - 70 mg/dL Final  . LDL Cholesterol 05/15/2014 118   Final  . Alkaline Phosphatase 05/15/2014 66  25 - 125 U/L Final  . ALT 05/15/2014 11  10 - 40 U/L Final  . AST 05/15/2014 20  14 - 40 U/L Final  . Bilirubin, Total 05/15/2014 0.5   Final  . TSH 05/15/2014 2.74  0.41 - 5.90 uIU/mL Final     Assessment/Plan 1. Fall, initial encounter Continue use walker  2. Lumbosacral pain Needs vertebral fracture ruled out - DG Lumbar Spine Complete; Future

## 2015-03-12 ENCOUNTER — Other Ambulatory Visit: Payer: Self-pay | Admitting: Internal Medicine

## 2015-05-14 ENCOUNTER — Other Ambulatory Visit: Payer: Self-pay | Admitting: Internal Medicine

## 2015-05-22 ENCOUNTER — Encounter: Payer: Self-pay | Admitting: Internal Medicine

## 2015-05-22 ENCOUNTER — Non-Acute Institutional Stay: Payer: PPO | Admitting: Internal Medicine

## 2015-05-22 VITALS — BP 100/62 | HR 68 | Temp 97.4°F | Wt 167.0 lb

## 2015-05-22 DIAGNOSIS — M545 Low back pain, unspecified: Secondary | ICD-10-CM

## 2015-05-22 DIAGNOSIS — N138 Other obstructive and reflux uropathy: Secondary | ICD-10-CM

## 2015-05-22 DIAGNOSIS — M5489 Other dorsalgia: Secondary | ICD-10-CM

## 2015-05-22 DIAGNOSIS — R1314 Dysphagia, pharyngoesophageal phase: Secondary | ICD-10-CM | POA: Diagnosis not present

## 2015-05-22 DIAGNOSIS — W19XXXA Unspecified fall, initial encounter: Secondary | ICD-10-CM

## 2015-05-22 DIAGNOSIS — N401 Enlarged prostate with lower urinary tract symptoms: Secondary | ICD-10-CM

## 2015-05-22 DIAGNOSIS — R413 Other amnesia: Secondary | ICD-10-CM | POA: Diagnosis not present

## 2015-05-22 DIAGNOSIS — E785 Hyperlipidemia, unspecified: Secondary | ICD-10-CM | POA: Diagnosis not present

## 2015-05-22 DIAGNOSIS — N403 Nodular prostate with lower urinary tract symptoms: Secondary | ICD-10-CM

## 2015-05-22 NOTE — Progress Notes (Signed)
Patient ID: Joel [name], male   DOB: 1921/06/10, 81 y.o.   MRN: [id number]    [place] of Service: Clinic (12)     No Known Allergies  Chief Complaint  Patient presents with  . Medical Management of Chronic Issues    memory, arthritis, dysphagia. Here with wife    HPI:  Lumbosacral pain - improved  Nodular prostate with urinary obstruction - tamsulosin and Proscar are helping  Fall, initial encounter - 2 non-injurious falls since last seen  Memory loss - unchanged  Dyslipidemia - controlled  Dysphagia, pharyngoesophageal phase - wife is cutting out foods. He avoids salads and dry foods. He feels like this is under better control.    Medications: Patient's Medications  New Prescriptions   No medications on file  Previous Medications   DOCUSATE SODIUM (COLACE) 100 MG CAPSULE    Take 100 mg by mouth daily as needed for mild constipation.    DORZOLAMIDE (TRUSOPT) 2 % OPHTHALMIC SOLUTION    Place 1 drop into both eyes 2 (two) times daily.   FINASTERIDE (PROSCAR) 5 MG TABLET    TAKE ONE TABLET BY MOUTH ONCE DAILY TO SHRINK THE PROSTATE   LATANOPROST (XALATAN) 0.005 % OPHTHALMIC SOLUTION    Place 1 drop into both eyes at bedtime.    MULTIPLE VITAMINS-MINERALS (CENTRUM) TABLET    Take 1 tablet by mouth daily.   TAMSULOSIN (FLOMAX) 0.4 MG CAPS CAPSULE    TAKE ONE CAPSULE  BY MOUTH ONCE  DAILY TO HELP PROSTATE AND TO REDUCE URINARY FREQUENCY in evening  Modified Medications   No medications on file  Discontinued Medications   TAMSULOSIN (FLOMAX) 0.4 MG CAPS CAPSULE    TAKE ONE CAPSULE  BY MOUTH ONCE  DAILY TO HELP PROSTATE AND TO REDUCE URINARY FREQUENCY     Review of Systems  Constitutional: Positive for activity change. Negative for fever, chills, appetite change and fatigue.  HENT: Negative.   Eyes: Negative.   Respiratory: Negative.   Cardiovascular: Negative for chest pain, palpitations and leg swelling.  Gastrointestinal:       Choking  when swallowing. Improved with instructions on chin tuck maneuver.  Endocrine: Negative.   Genitourinary: Positive for urgency, frequency and difficulty urinating.       Hx of nodular prostate.  Musculoskeletal: Positive for gait problem (Using walker with 2 front wheels and rear skids).       Miild discomfort in the left posterior knee. No  Effusion. Left shoulder pain when he lays on it. History compression fracture at T12 in 2005. Increased falls in May 2016. Pain in low back and lumbosacral area.  Skin:       History of herpes zoster in the ophthalmic branch that is healed. Generalized thin skin. Skin tear of the right wrist from a fall.  Neurological: Positive for dizziness. Negative for seizures, light-headedness, numbness and headaches.       Memory loss  Hematological: Negative.   Psychiatric/Behavioral: Negative.     Filed Vitals:   05/22/15 0947  BP: 100/62  Pulse: 68  Temp: 97.4 F (36.3 C)  TempSrc: Oral  Weight: 167 lb (75.751 kg)  SpO2: 98%   Body mass index is 27.79 kg/(m^2).  Physical Exam  Constitutional: He is oriented to person, place, and time. He appears well-developed and well-nourished. No distress.  HENT:  Head: Normocephalic and atraumatic.  Eyes:  Corrective lenses.  Neck: Neck supple. No JVD present. No tracheal deviation present. No  thyromegaly present.  Cardiovascular: Normal rate, normal heart sounds and intact distal pulses.  Exam reveals no gallop and no friction rub.   No murmur heard. Irregular rhythm.  Pulmonary/Chest: No respiratory distress. He has no wheezes. He has no rales. He exhibits no tenderness.  Abdominal: He exhibits no distension and no mass. There is no tenderness.  Genitourinary:  Prostate is 2+ enlarged and nodular.  Musculoskeletal: Normal range of motion. He exhibits no edema or tenderness.  Mild discomfort in the left posterior knee. No effusion. Gait instability. Full range of motion of the left shoulder. Tender  left buttock and hip. Halting gait. Using walker with 2 front wheels and rear skids. Left foot externally deviated when walking.  Lymphadenopathy:    He has no cervical adenopathy.  Neurological: He is alert and oriented to person, place, and time. No cranial nerve deficit. Coordination normal.  05/24/13 MMSE 28/30. Failed clock drawing. Normal vibratory sensation.  Skin: No rash noted. No erythema. No pallor.  Healing skin tear of the right wrist  Psychiatric: He has a normal mood and affect. His behavior is normal. Judgment and thought content normal.     Labs reviewed: No visits with results within 3 Month(s) from this visit. Latest known visit with results is:  Nursing Home on 05/23/2014  Component Date Value Ref Range Status  . Glucose 05/15/2014 84   Final  . BUN 05/15/2014 18  4 - 21 mg/dL Final  . Creatinine 05/15/2014 1.0  0.6 - 1.3 mg/dL Final  . Potassium 05/15/2014 4.4  3.4 - 5.3 mmol/L Final  . Sodium 05/15/2014 138  137 - 147 mmol/L Final  . Triglycerides 05/15/2014 114  40 - 160 mg/dL Final  . Cholesterol 05/15/2014 183  0 - 200 mg/dL Final  . HDL 05/15/2014 42  35 - 70 mg/dL Final  . LDL Cholesterol 05/15/2014 118   Final  . Alkaline Phosphatase 05/15/2014 66  25 - 125 U/L Final  . ALT 05/15/2014 11  10 - 40 U/L Final  . AST 05/15/2014 20  14 - 40 U/L Final  . Bilirubin, Total 05/15/2014 0.5   Final  . TSH 05/15/2014 2.74  0.41 - 5.90 uIU/mL Final     Assessment/Plan  1. Lumbosacral pain Improved  2. Nodular prostate with urinary obstruction Continue Flomax and Proscar  3. Fall, initial encounter Non-injurious. Patient gets overbalanced.  4. Memory loss MMSE next visit CMP, TSH next visit  5. Dyslipidemia Lipid panel next visit  6. Dysphagia, pharyngoesophageal phase Continue current dietary changes.

## 2015-07-12 ENCOUNTER — Other Ambulatory Visit: Payer: Self-pay | Admitting: Internal Medicine

## 2015-09-08 ENCOUNTER — Other Ambulatory Visit: Payer: Self-pay | Admitting: Internal Medicine

## 2015-09-10 ENCOUNTER — Other Ambulatory Visit: Payer: Self-pay | Admitting: *Deleted

## 2015-09-10 MED ORDER — FINASTERIDE 5 MG PO TABS: ORAL_TABLET | ORAL | Status: DC

## 2015-09-10 NOTE — Telephone Encounter (Signed)
[employer] Francis King 

## 2015-10-07 ENCOUNTER — Other Ambulatory Visit: Payer: Self-pay | Admitting: Internal Medicine

## 2015-10-08 ENCOUNTER — Other Ambulatory Visit: Payer: Self-pay | Admitting: *Deleted

## 2015-10-08 MED ORDER — TAMSULOSIN HCL 0.4 MG PO CAPS: ORAL_CAPSULE | ORAL | Status: DC

## 2015-10-08 NOTE — Telephone Encounter (Signed)
[employer] Frances King 

## 2015-11-12 DIAGNOSIS — E785 Hyperlipidemia, unspecified: Secondary | ICD-10-CM | POA: Diagnosis not present

## 2015-11-12 DIAGNOSIS — I495 Sick sinus syndrome: Secondary | ICD-10-CM | POA: Diagnosis not present

## 2015-11-12 DIAGNOSIS — R413 Other amnesia: Secondary | ICD-10-CM | POA: Diagnosis not present

## 2015-11-12 LAB — HEPATIC FUNCTION PANEL
ALT: 9 U/L — AB (ref 10–40)
AST: 16 U/L (ref 14–40)
Alkaline Phosphatase: 84 U/L (ref 25–125)
BILIRUBIN, TOTAL: 0.6 mg/dL

## 2015-11-12 LAB — BASIC METABOLIC PANEL
BUN: 22 mg/dL — AB (ref 4–21)
Creatinine: 0.9 mg/dL (ref 0.6–1.3)
Glucose: 88 mg/dL
Potassium: 4.5 mmol/L (ref 3.4–5.3)
SODIUM: 136 mmol/L — AB (ref 137–147)

## 2015-11-12 LAB — LIPID PANEL
Cholesterol: 173 mg/dL (ref 0–200)
HDL: 44 mg/dL (ref 35–70)
LDL CALC: 109 mg/dL
TRIGLYCERIDES: 102 mg/dL (ref 40–160)

## 2015-11-12 LAB — TSH: TSH: 2.68 u[IU]/mL (ref 0.41–5.90)

## 2015-11-13 ENCOUNTER — Encounter: Payer: Self-pay | Admitting: *Deleted

## 2015-11-13 DIAGNOSIS — H401122 Primary open-angle glaucoma, left eye, moderate stage: Secondary | ICD-10-CM | POA: Diagnosis not present

## 2015-11-13 DIAGNOSIS — H52203 Unspecified astigmatism, bilateral: Secondary | ICD-10-CM | POA: Diagnosis not present

## 2015-11-13 DIAGNOSIS — H2512 Age-related nuclear cataract, left eye: Secondary | ICD-10-CM | POA: Diagnosis not present

## 2015-11-13 DIAGNOSIS — H401112 Primary open-angle glaucoma, right eye, moderate stage: Secondary | ICD-10-CM | POA: Diagnosis not present

## 2015-11-20 ENCOUNTER — Encounter: Payer: Self-pay | Admitting: Internal Medicine

## 2015-11-20 ENCOUNTER — Non-Acute Institutional Stay: Payer: PPO | Admitting: Internal Medicine

## 2015-11-20 VITALS — BP 90/60 | HR 60 | Temp 97.3°F | Resp 20 | Ht 65.0 in | Wt 159.4 lb

## 2015-11-20 DIAGNOSIS — N403 Nodular prostate with lower urinary tract symptoms: Secondary | ICD-10-CM

## 2015-11-20 DIAGNOSIS — C44329 Squamous cell carcinoma of skin of other parts of face: Secondary | ICD-10-CM | POA: Diagnosis not present

## 2015-11-20 DIAGNOSIS — R972 Elevated prostate specific antigen [PSA]: Secondary | ICD-10-CM | POA: Diagnosis not present

## 2015-11-20 DIAGNOSIS — R413 Other amnesia: Secondary | ICD-10-CM

## 2015-11-20 DIAGNOSIS — E785 Hyperlipidemia, unspecified: Secondary | ICD-10-CM | POA: Diagnosis not present

## 2015-11-20 DIAGNOSIS — N401 Enlarged prostate with lower urinary tract symptoms: Secondary | ICD-10-CM

## 2015-11-20 DIAGNOSIS — N138 Other obstructive and reflux uropathy: Secondary | ICD-10-CM

## 2015-11-20 DIAGNOSIS — R1314 Dysphagia, pharyngoesophageal phase: Secondary | ICD-10-CM

## 2015-11-20 NOTE — Progress Notes (Signed)
Patient ID: Joel [name], male   DOB: 08/15/1921, 82 y.o.   MRN: [id number]  Provider:   Location: [place] of Service:  Clinic (12)   PCP: [name], MD Patient Care Team: [name], MD as PCP - General (Internal Medicine) [facility] [name], MD as Consulting Physician (Ophthalmology) [name], MD as Consulting Physician (Gastroenterology) [name], MD as Consulting Physician (Urology) [name], MD as Consulting Physician (Dermatology)  Extended Emergency Contact Information Primary Emergency Contact: Bohall,Barbara Address: [address]          [place], [place] [address] [place] Phone: [phone] Relation: Spouse  Code Status: DNR Goals of Care: Advanced Directive information Advanced Directives 11/20/2015  Does patient have an advance directive? -  Type of Paramedic of [place];Living will;Out of facility DNR (pink MOST or yellow form)  Does patient want to make changes to advanced directive? -  Copy of advanced directive(s) in chart? Yes  Pre-existing out of facility DNR order (yellow form or pink MOST form) Yellow form placed in chart (order not valid for inpatient use)     Chief Complaint  Patient presents with  . Annual Exam    MMSE done,    HPI: Patient is a 82 y.o. male seen today for an annual comprehensive examination.  Patient and wife say he has been doing well over the last several months.   Although he uses a walker, there've been no falls.  There are some numerous skin lesions , which are mostly seborrheic keratoses,but thee is a new bleeding one on the left cheek with filiform structures suggesting possibility of a squamous cell cancer over an actinic keratosis.  Memory loss is basically unchanged.   He continues to have swallowing difficulties and has lost weight related to this.  Past Medical History  Diagnosis Date  . Nodular  prostate without urinary obstruction 05/18/2012  . Herpes zoster 05/13/2011  . 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 Elevated prostate specific antigen (PSA) 05/13/2011  . Herpes zoster with ophthalmic complication 99991111  . Unspecified constipation 09/24/2010  . Other and unspecified hyperlipidemia 09/20/2010  . Unspecified glaucoma 09/20/2010    bilateral  . Hypertrophy of prostate without urinary obstruction and other lower urinary tract symptoms (LUTS) 09/20/2010  . Osteoarthrosis, unspecified whether generalized or localized, unspecified site 09/20/2010  . Personal history of colonic polyps 09/20/2010  . Memory loss 03/25/2009   Past Surgical History  Procedure Laterality Date  . Tonsillectomy  1938  . Appendectomy  1940  . Vasectomy  1972  . Colectomy  07/17/1997    sigmoid submucosal lipoma Dr. Oletta Lamas  . Cataract extraction w/ intraocular lens implant Right 2010    Dr. Ellie Lunch  . Tumor removal  1993    Removal of Benign Intestinal Tumor   . Skin cancer excision Right 05/19/2013    ear Dr. Dannette Barbara  . Colonoscopy  2000    normal  . Colonoscopy  04/21/2002    polypectomy  . Colonoscopy  06/10/2004    no polyps    reports that he quit smoking about 41 years ago. He has never used smokeless tobacco. He reports that he drinks alcohol. He reports that he does not use illicit drugs. Social History   Social History  . Marital Status: Married    Spouse Name: N/A  . Number of Children: N/A  . Years of Education: N/A  Occupational History  . retired Medical illustrator    Social History Main Topics  . Smoking status: Former Smoker    Quit date: 04/12/1974  . Smokeless tobacco: Never Used  . Alcohol Use: Yes     Comment: wine occasionaly  . Drug Use: No  . Sexual Activity: Not on file   Other Topics Concern  . Not on file   Social History Narrative   Lives at [facility] with wife, moved in 02/10/2006   Family History  Problem Relation Age of Onset  . Heart  disease Father     Pertinent  Health Maintenance Due  Topic Date Due  . PNA vac Low Risk Adult (2 of 2 - PCV13) 09/29/1996  . INFLUENZA VACCINE  04/29/2016   Fall Risk  11/20/2015 05/22/2015 12/20/2014 11/21/2014 05/23/2014  Falls in the past year? Yes Yes Yes No No  Number falls in past yr: 1 2 or more 1 - -  Injury with Fall? Yes Yes Yes - -  Risk Factor Category  - High Fall Risk - - -   Depression screen [facility] 2/9 11/21/2014 05/23/2014 05/24/2013  Decreased Interest 0 0 1  Down, Depressed, Hopeless 0 0 0  PHQ - 2 Score 0 0 1    Functional Status Survey:    No Known Allergies    Medication List       This list is accurate as of: 11/20/15 11:06 AM.  Always use your most recent med list.               CENTRUM tablet  Take 1 tablet by mouth daily.     docusate sodium 100 MG capsule  Commonly known as:  COLACE  Take 100 mg by mouth daily as needed for mild constipation.     dorzolamide 2 % ophthalmic solution  Commonly known as:  TRUSOPT  Place 1 drop into both eyes 2 (two) times daily.     finasteride 5 MG tablet  Commonly known as:  PROSCAR  Take one tablet by mouth once daily to shrink the prostate     latanoprost 0.005 % ophthalmic solution  Commonly known as:  XALATAN  Place 1 drop into both eyes at bedtime.     tamsulosin 0.4 MG Caps capsule  Commonly known as:  FLOMAX  Take one capsule by mouth once daily to help prostate and to reduce urinary frequency        Review of Systems  Constitutional: Positive for activity change. Negative for fever, chills, appetite change and fatigue.  HENT: Negative.   Eyes: Negative.   Respiratory: Negative.   Cardiovascular: Negative for chest pain, palpitations and leg swelling.  Gastrointestinal:       Choking when swallowing. Improved with instructions on chin tuck maneuver.  Endocrine: Negative.   Genitourinary: Positive for urgency, frequency and difficulty urinating.       Hx of nodular prostate.    Musculoskeletal: Positive for gait problem (Using walker with 2 front wheels and rear skids).       Miild discomfort in the left posterior knee. No  Effusion. Left shoulder pain when he lays on it. History compression fracture at T12 in 2005. Increased falls in May 2016. Pain in low back and lumbosacral area.  Skin:       History of herpes zoster in the ophthalmic branch that is healed. Generalized thin skin. Skin tear of the right wrist from a fall.  Neurological: Positive for dizziness. Negative for seizures, light-headedness, numbness and  headaches.       Memory loss  Hematological: Negative.   Psychiatric/Behavioral: Negative.     Filed Vitals:   11/20/15 1006  BP: 90/60  Pulse: 60  Temp: 97.3 F (36.3 C)  TempSrc: Oral  Resp: 20  Height: 5\' 5"  (1.651 m)  Weight: 159 lb 6.4 oz (72.303 kg)  SpO2: 98%   Body mass index is 26.53 kg/(m^2). Physical Exam  Constitutional: He is oriented to person, place, and time. He appears well-developed and well-nourished. No distress.  HENT:  Head: Normocephalic and atraumatic.  Eyes:  Corrective lenses.  Neck: Neck supple. No JVD present. No tracheal deviation present. No thyromegaly present.  Cardiovascular: Normal rate, normal heart sounds and intact distal pulses.  Exam reveals no gallop and no friction rub.   No murmur heard. Irregular rhythm.  Pulmonary/Chest: No respiratory distress. He has no wheezes. He has no rales. He exhibits no tenderness.  Abdominal: He exhibits no distension and no mass. There is no tenderness.  Genitourinary:  Prostate is 2+ enlarged and nodular.  Musculoskeletal: Normal range of motion. He exhibits no edema or tenderness.  Mild discomfort in the left posterior knee. No effusion. Gait instability. Full range of motion of the left shoulder. Tender left buttock and hip. Halting gait. Using walker with 2 front wheels and rear skids. Left foot externally deviated when walking.  Lymphadenopathy:    He has  no cervical adenopathy.  Neurological: He is alert and oriented to person, place, and time. No cranial nerve deficit. Coordination normal.  05/24/13 MMSE 28/30. Failed clock drawing. 11/20/15 MMSE 24/30. Failed clock drawing. Normal vibratory sensation.  Skin: No rash noted. No erythema. No pallor.   Actinic keratosis left cheek with some bleeding  Psychiatric: He has a normal mood and affect. His behavior is normal. Judgment and thought content normal.    Labs reviewed: Basic Metabolic Panel:  Recent Labs  11/12/15  NA 136*  K 4.5  BUN 22*  CREATININE 0.9   Liver Function Tests:  Recent Labs  11/12/15  AST 16  ALT 9*  ALKPHOS 84   No results for input(s): LIPASE, AMYLASE in the last 8760 hours. No results for input(s): AMMONIA in the last 8760 hours. CBC: No results for input(s): WBC, NEUTROABS, HGB, HCT, MCV, PLT in the last 8760 hours. Cardiac Enzymes: No results for input(s): CKTOTAL, CKMB, CKMBINDEX, TROPONINI in the last 8760 hours. BNP: Invalid input(s): POCBNP No results found for: HGBA1C Lab Results  Component Value Date   TSH 2.68 11/12/2015   No results found for: VITAMINB12 No results found for: FOLATE No results found for: IRON, TIBC, FERRITIN  Assessment/Plan 1. Memory loss  about the same as last year. There may be a slight loss on the MMSE score.  2. Dysphagia, pharyngoesophageal phase unchanged  3. Dyslipidemia controlled  4. Nodular prostate with urinary obstruction Unchanged.  5. Elevated prostate specific antigen (PSA) No further checks indicated at this time  6. 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 Wife will schedule appt.    Family/ staff Communication:    wife present during today's examination

## 2015-12-10 ENCOUNTER — Other Ambulatory Visit: Payer: Self-pay | Admitting: *Deleted

## 2015-12-10 ENCOUNTER — Other Ambulatory Visit: Payer: Self-pay | Admitting: Internal Medicine

## 2015-12-10 MED ORDER — FINASTERIDE 5 MG PO TABS: ORAL_TABLET | ORAL | Status: DC

## 2015-12-10 NOTE — Telephone Encounter (Signed)
Joel [name] 

## 2015-12-24 ENCOUNTER — Ambulatory Visit
Admission: RE | Admit: 2015-12-24 | Discharge: 2015-12-24 | Disposition: A | Discharge status: Home / Self Care | Payer: PPO | Source: Ambulatory Visit | Attending: Internal Medicine | Admitting: Internal Medicine

## 2015-12-24 ENCOUNTER — Telehealth: Payer: Self-pay

## 2015-12-24 DIAGNOSIS — Z85828 Personal history of other malignant neoplasm of skin: Secondary | ICD-10-CM | POA: Diagnosis not present

## 2015-12-24 DIAGNOSIS — M5489 Other dorsalgia: Secondary | ICD-10-CM

## 2015-12-24 DIAGNOSIS — C44229 Squamous cell carcinoma of skin of left ear and external auricular canal: Secondary | ICD-10-CM | POA: Diagnosis not present

## 2015-12-24 DIAGNOSIS — D692 Other nonthrombocytopenic purpura: Secondary | ICD-10-CM | POA: Diagnosis not present

## 2015-12-24 DIAGNOSIS — L820 Inflamed seborrheic keratosis: Secondary | ICD-10-CM | POA: Diagnosis not present

## 2015-12-24 DIAGNOSIS — L720 Epidermal cyst: Secondary | ICD-10-CM | POA: Diagnosis not present

## 2015-12-24 DIAGNOSIS — L821 Other seborrheic keratosis: Secondary | ICD-10-CM | POA: Diagnosis not present

## 2015-12-24 DIAGNOSIS — M545 Low back pain: Secondary | ICD-10-CM | POA: Diagnosis not present

## 2015-12-24 DIAGNOSIS — D485 Neoplasm of uncertain behavior of skin: Secondary | ICD-10-CM | POA: Diagnosis not present

## 2015-12-24 DIAGNOSIS — L570 Actinic keratosis: Secondary | ICD-10-CM | POA: Diagnosis not present

## 2015-12-24 NOTE — Telephone Encounter (Signed)
Message on triage voice mail, Joel [name] fell Thursday night, Sat & Sun and this am having a lot of lower back pain, would like appt for x-ray today. Called wife he fell backwards again, he has a great deal of pain trying to get in and out of bed. She is giving him Advil twice a day. Not having any pain going down his legs. This is in the same place as it was in May when he fell. Spoke with Dr. Mariea Clonts ok to get lumbar x-ray. Left message on [phone] patient # that we put in order for back x-ray, walk-in 8:30 to 4:30 at [facility].

## 2015-12-25 ENCOUNTER — Telehealth: Payer: Self-pay

## 2015-12-25 DIAGNOSIS — M545 Low back pain, unspecified: Secondary | ICD-10-CM

## 2015-12-25 NOTE — Telephone Encounter (Signed)
Left message to call back for 12/24/15 lumbar spine x-ray. Per Dr. Nyoka Cowden stable old compression fracture of L2. Development of mild compression deformity of L5 vertebral body which may represent acute fracture. MRI without contrast recommended.  Order put into computer

## 2015-12-26 NOTE — Telephone Encounter (Signed)
Spoke with Mrs Kattner, they have appt for MRI 12/30/15 at 7:30 am. The only time he hurts is when he tries to lay down or get up. Will wait on MRI report before we can suggest any other treatment.

## 2015-12-30 ENCOUNTER — Ambulatory Visit
Admission: RE | Admit: 2015-12-30 | Discharge: 2015-12-30 | Disposition: A | Discharge status: Home / Self Care | Payer: PPO | Source: Ambulatory Visit | Attending: Internal Medicine | Admitting: Internal Medicine

## 2015-12-30 DIAGNOSIS — S32058A Other fracture of fifth lumbar vertebra, initial encounter for closed fracture: Secondary | ICD-10-CM | POA: Diagnosis not present

## 2015-12-30 DIAGNOSIS — M545 Low back pain, unspecified: Secondary | ICD-10-CM

## 2016-01-01 DIAGNOSIS — Z85828 Personal history of other malignant neoplasm of skin: Secondary | ICD-10-CM | POA: Diagnosis not present

## 2016-01-01 DIAGNOSIS — C44229 Squamous cell carcinoma of skin of left ear and external auricular canal: Secondary | ICD-10-CM | POA: Diagnosis not present

## 2016-01-01 DIAGNOSIS — L089 Local infection of the skin and subcutaneous tissue, unspecified: Secondary | ICD-10-CM | POA: Diagnosis not present

## 2016-01-02 ENCOUNTER — Telehealth: Payer: Self-pay

## 2016-01-02 NOTE — Telephone Encounter (Signed)
Wife called wanted MRI results, call cell [phone]. To Dr. Nyoka Cowden

## 2016-01-02 NOTE — Telephone Encounter (Signed)
Spoke with wife about MRI results. He seems to be getting better, he is walking the halls at [facility]. The only problem still is laying down, but that too is getting better. Has used Advil, when needed. Appreciated the report. Will call if things change. He fell again today, just a skin tear. [place] nurse checked him, vitals were find.

## 2016-01-02 NOTE — Telephone Encounter (Signed)
1. Acute, mild L5 compression fracture. 2. Chronic T11 and L2 compression fractures. 3. Multilevel disc and facet degeneration, most notable at L4-5 where there is moderate spinal stenosis and left greater than right lateral recess and neural foraminal stenosis. 4. Mild diffuse bladder wall thickening and irregularity, incompletely visualized. Correlate for signs of bladder outlet obstruction or cystitis.  Report shows multiple possibilities for origin of pain. The most likely is the acute L5 fracture. Other possibilities include disc disease, spinal stenosis , and neural foraminal stenosis. Bladder findings are not likely to be significant.  If pain is intolerable, I could refer him for possible kyphoplasty. I expect the pain will gradually improve without this intervention.

## 2016-01-10 ENCOUNTER — Other Ambulatory Visit: Payer: Self-pay | Admitting: Internal Medicine

## 2016-02-05 ENCOUNTER — Encounter: Payer: Self-pay | Admitting: Internal Medicine

## 2016-02-05 ENCOUNTER — Non-Acute Institutional Stay: Payer: PPO | Admitting: Internal Medicine

## 2016-02-05 VITALS — BP 122/62 | HR 70 | Temp 97.7°F | Ht 65.0 in | Wt 158.0 lb

## 2016-02-05 DIAGNOSIS — R29898 Other symptoms and signs involving the musculoskeletal system: Secondary | ICD-10-CM | POA: Diagnosis not present

## 2016-02-05 MED ORDER — ASPIRIN EC 81 MG PO TBEC: DELAYED_RELEASE_TABLET | ORAL | Status: DC

## 2016-02-05 NOTE — Progress Notes (Signed)
Patient ID: Joel [name], male   DOB: 12/26/1920, 82 y.o.   MRN: [id number]    [place] of Service: Clinic (12)     No Known Allergies  Chief Complaint  Patient presents with  . Acute Visit    trouble walking right foot drag for 3 days, here with wife    HPI:  About 3 days ago, he had severe weakness of the right foot and was dragging it when he tried to walk.He denies any pain associated with this. There was no headache, visual changes, palpitations, nausea, increased loss of bladder continence, or pain down the leg.  In April 2017, we confirmed through an MRI of the lumbar spine that he had a lumbar fracture at about L5. He has steadily improved from this although he does have some residual discomfort. There was no increase in the discomfort at the time of weakening of the right leg and foot.  Today, he is improving according to his wife. He is able to use this walker to get around, whereas yesterday she had to get a wheelchair for him to get to dinner.  No prior history of stroke.  Medications: Patient's Medications  New Prescriptions   No medications on file  Previous Medications   DOCUSATE SODIUM (COLACE) 100 MG CAPSULE    Take 100 mg by mouth daily as needed for mild constipation.    DORZOLAMIDE (TRUSOPT) 2 % OPHTHALMIC SOLUTION    Place 1 drop into both eyes 2 (two) times daily.   FINASTERIDE (PROSCAR) 5 MG TABLET    Take one tablet by mouth once daily to shrink the prostate   LATANOPROST (XALATAN) 0.005 % OPHTHALMIC SOLUTION    Place 1 drop into both eyes at bedtime.    MULTIPLE VITAMINS-MINERALS (CENTRUM) TABLET    Take 1 tablet by mouth daily.   TAMSULOSIN (FLOMAX) 0.4 MG CAPS CAPSULE    TAKE ONE CAPSULE BY MOUTH ONCE DAILY TO HELP PROSTATE AND TO REDUCE URINARY FREQUENCY  Modified Medications   No medications on file  Discontinued Medications   No medications on file     Review of Systems  Constitutional: Positive for activity  change. Negative for fever, chills, appetite change and fatigue.  HENT: Negative.   Eyes: Negative.   Respiratory: Negative.   Cardiovascular: Negative for chest pain, palpitations and leg swelling.  Gastrointestinal:       Choking when swallowing. Improved with instructions on chin tuck maneuver.  Endocrine: Negative.   Genitourinary: Positive for urgency, frequency and difficulty urinating.       Hx of nodular prostate.  Musculoskeletal: Positive for gait problem (Using walker with 2 front wheels and rear skids. Right foot dragging.).       Miild discomfort in the left posterior knee. No  Effusion. Left shoulder pain when he lays on it. History compression fracture at T12 in 2005. Increased falls in May 2016. Pain in low back and lumbosacral area.  Skin:       History of herpes zoster in the ophthalmic branch that is healed. Generalized thin skin. Skin tear of the right wrist from a fall.  Neurological: Positive for dizziness. Negative for seizures, light-headedness, numbness and headaches.       Memory loss  Hematological: Negative.   Psychiatric/Behavioral: Negative.     Filed Vitals:   02/05/16 0853  BP: 122/62  Pulse: 70  Temp: 97.7 F (36.5 C)  TempSrc: Oral  Height: 5' 5"  (1.651 m)  Weight: 158 lb (71.668 kg)  SpO2: 94%   Wt Readings from Last 3 Encounters:  02/05/16 158 lb (71.668 kg)  11/20/15 159 lb 6.4 oz (72.303 kg)  05/22/15 167 lb (75.751 kg)    Body mass index is 26.29 kg/(m^2).  Physical Exam  Constitutional: He is oriented to person, place, and time. He appears well-developed and well-nourished. No distress.  HENT:  Head: Normocephalic and atraumatic.  Eyes:  Corrective lenses.  Neck: Neck supple. No JVD present. No tracheal deviation present. No thyromegaly present.  Cardiovascular: Normal rate, normal heart sounds and intact distal pulses.  Exam reveals no gallop and no friction rub.   No murmur heard. Irregular rhythm.  Pulmonary/Chest: No  respiratory distress. He has no wheezes. He has no rales. He exhibits no tenderness.  Abdominal: He exhibits no distension and no mass. There is no tenderness.  Genitourinary:  Prostate is 2+ enlarged and nodular.  Musculoskeletal: Normal range of motion. He exhibits no edema or tenderness.  Mild discomfort in the left posterior knee. No effusion. Gait instability. Full range of motion of the left shoulder. Tender left buttock and hip. Halting gait. Using walker with 2 front wheels and rear skids. Left foot externally deviated when walking.  Lymphadenopathy:    He has no cervical adenopathy.  Neurological: He is alert and oriented to person, place, and time. No cranial nerve deficit. Coordination normal.  05/24/13 MMSE 28/30. Failed clock drawing. 11/20/15 MMSE 24/30. Failed clock drawing. Normal vibratory sensation. Stomping gait with the right foot.  Skin: No rash noted. No erythema. No pallor.   Actinic keratosis left cheek with some bleeding  Psychiatric: He has a normal mood and affect. His behavior is normal. Judgment and thought content normal.     Labs reviewed: Lab Summary Latest Ref Rng 11/12/2015 05/15/2014  Hemoglobin 13.0-17.0 g/dL (None) (None)  Hematocrit 39.0-52.0 % (None) (None)  White count - (None) (None)  Platelet count - (None) (None)  Sodium 137 - 147 mmol/L 136(A) 138  Potassium 3.4 - 5.3 mmol/L 4.5 4.4  Calcium - (None) (None)  Phosphorus - (None) (None)  Creatinine 0.6 - 1.3 mg/dL 0.9 1.0  AST 14 - 40 U/L 16 20  Alk Phos 25 - 125 U/L 84 66  Bilirubin - (None) (None)  Glucose - 88 84  Cholesterol 0 - 200 mg/dL 173 183  HDL cholesterol 35 - 70 mg/dL 44 42  Triglycerides 40 - 160 mg/dL 102 114  LDL Direct - (None) (None)  LDL Calc - 109 118  Total protein - (None) (None)  Albumin - (None) (None)   Lab Results  Component Value Date   TSH 2.68 11/12/2015   Lab Results  Component Value Date   BUN 22* 11/12/2015   BUN 18 05/15/2014   BUN 17 05/04/2011     Lab Results  Component Value Date   CREATININE 0.9 11/12/2015   CREATININE 1.0 05/15/2014   CREATININE 0.97 05/04/2011   No results found for: HGBA1C     Assessment/Plan  1. Weakness of right leg Etiology is uncertain. I'm most suspicious of a small CVA/lacunar infarct. It is possible that his lumbar fracture is involved in the weakness of the leg, but I would expect an increase in discomfort in the back, which did not occur. I discussed rationale for possible MRI with patient and wife. My recommendation currently is that I do not feel that the results would likely change my recommendations. They are satisfied with this recommendation. -Start aspirin 81  mg daily

## 2016-02-13 DIAGNOSIS — R296 Repeated falls: Secondary | ICD-10-CM | POA: Diagnosis not present

## 2016-02-13 DIAGNOSIS — M6281 Muscle weakness (generalized): Secondary | ICD-10-CM | POA: Diagnosis not present

## 2016-02-13 DIAGNOSIS — M5441 Lumbago with sciatica, right side: Secondary | ICD-10-CM | POA: Diagnosis not present

## 2016-02-13 DIAGNOSIS — M545 Low back pain: Secondary | ICD-10-CM | POA: Diagnosis not present

## 2016-02-13 DIAGNOSIS — Z9181 History of falling: Secondary | ICD-10-CM | POA: Diagnosis not present

## 2016-02-13 DIAGNOSIS — R2681 Unsteadiness on feet: Secondary | ICD-10-CM | POA: Diagnosis not present

## 2016-03-03 DIAGNOSIS — M6281 Muscle weakness (generalized): Secondary | ICD-10-CM | POA: Diagnosis not present

## 2016-03-03 DIAGNOSIS — Z9181 History of falling: Secondary | ICD-10-CM | POA: Diagnosis not present

## 2016-03-03 DIAGNOSIS — R2681 Unsteadiness on feet: Secondary | ICD-10-CM | POA: Diagnosis not present

## 2016-03-03 DIAGNOSIS — M5441 Lumbago with sciatica, right side: Secondary | ICD-10-CM | POA: Diagnosis not present

## 2016-03-03 DIAGNOSIS — R296 Repeated falls: Secondary | ICD-10-CM | POA: Diagnosis not present

## 2016-03-03 DIAGNOSIS — M545 Low back pain: Secondary | ICD-10-CM | POA: Diagnosis not present

## 2016-03-31 DIAGNOSIS — M5441 Lumbago with sciatica, right side: Secondary | ICD-10-CM | POA: Diagnosis not present

## 2016-03-31 DIAGNOSIS — M6281 Muscle weakness (generalized): Secondary | ICD-10-CM | POA: Diagnosis not present

## 2016-03-31 DIAGNOSIS — R296 Repeated falls: Secondary | ICD-10-CM | POA: Diagnosis not present

## 2016-03-31 DIAGNOSIS — Z9181 History of falling: Secondary | ICD-10-CM | POA: Diagnosis not present

## 2016-03-31 DIAGNOSIS — R2681 Unsteadiness on feet: Secondary | ICD-10-CM | POA: Diagnosis not present

## 2016-03-31 DIAGNOSIS — M545 Low back pain: Secondary | ICD-10-CM | POA: Diagnosis not present

## 2016-04-30 DIAGNOSIS — M5441 Lumbago with sciatica, right side: Secondary | ICD-10-CM | POA: Diagnosis not present

## 2016-04-30 DIAGNOSIS — Z9181 History of falling: Secondary | ICD-10-CM | POA: Diagnosis not present

## 2016-04-30 DIAGNOSIS — M6281 Muscle weakness (generalized): Secondary | ICD-10-CM | POA: Diagnosis not present

## 2016-04-30 DIAGNOSIS — R2681 Unsteadiness on feet: Secondary | ICD-10-CM | POA: Diagnosis not present

## 2016-04-30 DIAGNOSIS — M545 Low back pain: Secondary | ICD-10-CM | POA: Diagnosis not present

## 2016-04-30 DIAGNOSIS — R296 Repeated falls: Secondary | ICD-10-CM | POA: Diagnosis not present

## 2016-05-11 ENCOUNTER — Other Ambulatory Visit: Payer: Self-pay | Admitting: Internal Medicine

## 2016-05-16 DIAGNOSIS — H401122 Primary open-angle glaucoma, left eye, moderate stage: Secondary | ICD-10-CM | POA: Diagnosis not present

## 2016-05-16 DIAGNOSIS — H401112 Primary open-angle glaucoma, right eye, moderate stage: Secondary | ICD-10-CM | POA: Diagnosis not present

## 2016-05-20 ENCOUNTER — Encounter: Payer: Self-pay | Admitting: Internal Medicine

## 2016-05-20 ENCOUNTER — Non-Acute Institutional Stay: Payer: PPO | Admitting: Internal Medicine

## 2016-05-20 VITALS — BP 122/58 | HR 84 | Temp 97.9°F | Ht 65.0 in | Wt 161.0 lb

## 2016-05-20 DIAGNOSIS — Z23 Encounter for immunization: Secondary | ICD-10-CM | POA: Diagnosis not present

## 2016-05-20 DIAGNOSIS — R29898 Other symptoms and signs involving the musculoskeletal system: Secondary | ICD-10-CM | POA: Diagnosis not present

## 2016-05-20 DIAGNOSIS — R413 Other amnesia: Secondary | ICD-10-CM

## 2016-05-20 DIAGNOSIS — R1314 Dysphagia, pharyngoesophageal phase: Secondary | ICD-10-CM

## 2016-05-20 DIAGNOSIS — W19XXXA Unspecified fall, initial encounter: Secondary | ICD-10-CM | POA: Diagnosis not present

## 2016-05-20 NOTE — Progress Notes (Signed)
[place] of Service: Clinic (12)     No Known Allergies  Chief Complaint  Patient presents with  . Medical Management of Chronic Issues    6 month medication management memory, dysphagia. Here with wife.   . Fall    from March 6th to August 9th has fallen 10 times. (R) leg is weak. Using walker in apartment, wheelchair when they go out. Been in therapy all this time, helps. Also goes to [employer] chair excerise class 6 days a week.    HPI:   Fall, initial encounter - Patient has had 10 falls the last 6 months. They occur in a variety of areas. They're usually associated with position changes. He denies dizziness or palpitations. Sometimes his legs will buckle and give way under him. He is using a walker when he is up on his feet. More of the time, he is now riding in wheelchairs.  Weakness of right leg - unable to stand and move about. No focal pain or tenderness. No joint swelling.  Memory loss - mild memory loss. Unchanged from 6 months ago.  Dysphagia, pharyngoesophageal phase - patient has his food cut into small pieces by his wife. He denies coughing at meals. Previous issues with swallowing seem to be stable.    Medications: Patient's Medications  New Prescriptions   No medications on file  Previous Medications   DOCUSATE SODIUM (COLACE) 100 MG CAPSULE    Take 100 mg by mouth daily as needed for mild constipation.    DORZOLAMIDE (TRUSOPT) 2 % OPHTHALMIC SOLUTION    Place 1 drop into both eyes 2 (two) times daily.   FINASTERIDE (PROSCAR) 5 MG TABLET    Take one tablet by mouth once daily to shrink the prostate   LATANOPROST (XALATAN) 0.005 % OPHTHALMIC SOLUTION    Place 1 drop into both eyes at bedtime.    MULTIPLE VITAMINS-MINERALS (CENTRUM) TABLET    Take 1 tablet by mouth daily.   TAMSULOSIN (FLOMAX) 0.4 MG CAPS CAPSULE    TAKE ONE CAPSULE BY MOUTH ONCE DAILY TO HELP PROSTATE AND TO REDUCE URINARY FREQUENCY  Modified Medications   No medications on  file  Discontinued Medications   ASPIRIN EC 81 MG TABLET    One daily to help prevent stroke or heart attack     Review of Systems  Constitutional: Positive for activity change. Negative for appetite change, chills, fatigue and fever.  HENT: Negative.   Eyes: Negative.   Respiratory: Negative.   Cardiovascular: Negative for chest pain, palpitations and leg swelling.  Gastrointestinal:       Choking when swallowing. Improved with instructions on chin tuck maneuver.  Endocrine: Negative.   Genitourinary: Positive for difficulty urinating, frequency and urgency.       Hx of nodular prostate.  Musculoskeletal: Positive for gait problem (Using walker with 2 front wheels and rear skids. Right foot dragging.).       Miild discomfort in the left posterior knee. No  Effusion. Left shoulder pain when he lays on it. History compression fracture at T12 in 2005. Increased falls in May 2016. Pain in low back and lumbosacral area.  Skin:       History of herpes zoster in the ophthalmic branch that is healed. Generalized thin skin. Skin tear of the right wrist from a fall.  Neurological: Positive for dizziness. Negative for seizures, light-headedness, numbness and headaches.       Memory loss  Hematological: Negative.  Psychiatric/Behavioral: Negative.     Vitals:   05/20/16 0927  BP: (!) 122/58  Pulse: 84  Temp: 97.9 F (36.6 C)  TempSrc: Oral  SpO2: 97%  Weight: 161 lb (73 kg)  Height: 5' 5"  (1.651 m)   Wt Readings from Last 3 Encounters:  05/20/16 161 lb (73 kg)  02/05/16 158 lb (71.7 kg)  11/20/15 159 lb 6.4 oz (72.3 kg)    Body mass index is 26.79 kg/m.  Physical Exam  Constitutional: He is oriented to person, place, and time. He appears well-developed and well-nourished. No distress.  HENT:  Head: Normocephalic and atraumatic.  Eyes:  Corrective lenses.  Neck: Neck supple. No JVD present. No tracheal deviation present. No thyromegaly present.  Cardiovascular: Normal  rate, normal heart sounds and intact distal pulses.  Exam reveals no gallop and no friction rub.   No murmur heard. Irregular rhythm.  Pulmonary/Chest: No respiratory distress. He has no wheezes. He has no rales. He exhibits no tenderness.  Abdominal: He exhibits no distension and no mass. There is no tenderness.  Genitourinary:  Genitourinary Comments: Prostate is 2+ enlarged and nodular.  Musculoskeletal: Normal range of motion. He exhibits no edema or tenderness.  Mild discomfort in the left posterior knee. No effusion. Gait instability. Full range of motion of the left shoulder. Tender left buttock and hip. Halting gait. Using walker with 2 front wheels and rear skids. Left foot externally deviated when walking.  Lymphadenopathy:    He has no cervical adenopathy.  Neurological: He is alert and oriented to person, place, and time. No cranial nerve deficit. Coordination normal.  05/24/13 MMSE 28/30. Failed clock drawing. 11/20/15 MMSE 24/30. Failed clock drawing. Normal vibratory sensation. Stomping gait with the right foot.  Skin: No rash noted. No erythema. No pallor.   Actinic keratosis left cheek with some bleeding  Psychiatric: He has a normal mood and affect. His behavior is normal. Judgment and thought content normal.     Labs reviewed: Lab Summary Latest Ref Rng & Units 11/12/2015 05/15/2014  Hemoglobin 13.0-17.0 g/dL (None) (None)  Hematocrit 39.0-52.0 % (None) (None)  White count - (None) (None)  Platelet count - (None) (None)  Sodium 137 - 147 mmol/L 136(A) 138  Potassium 3.4 - 5.3 mmol/L 4.5 4.4  Calcium - (None) (None)  Phosphorus - (None) (None)  Creatinine 0.6 - 1.3 mg/dL 0.9 1.0  AST 14 - 40 U/L 16 20  Alk Phos 25 - 125 U/L 84 66  Bilirubin - (None) (None)  Glucose mg/dL 88 84  Cholesterol 0 - 200 mg/dL 173 183  HDL cholesterol 35 - 70 mg/dL 44 42  Triglycerides 40 - 160 mg/dL 102 114  LDL Direct - (None) (None)  LDL Calc mg/dL 109 118  Total protein - (None)  (None)  Albumin - (None) (None)  Some recent data might be hidden   Lab Results  Component Value Date   TSH 2.68 11/12/2015   Lab Results  Component Value Date   BUN 22 (A) 11/12/2015   BUN 18 05/15/2014   BUN 17 05/04/2011   Lab Results  Component Value Date   CREATININE 0.9 11/12/2015   CREATININE 1.0 05/15/2014   CREATININE 0.97 05/04/2011   No results found for: HGBA1C     Assessment/Plan  1. Fall, initial encounter I do not see any immediately correctable factors. Patient was counseled to take a "timeout" of at least 5 seconds anytime he changes in position to make sure that everything is in order.  His wife says that he tries to "go fast".  2. Weakness of right leg Chronic issue that is unchanged. Weakness is not so pronounced that he needs to brace.  3. Memory loss MMSE next visit  4. Dysphagia, pharyngoesophageal phase Improved/ stable  5. Need for vaccination with 13-polyvalent pneumococcal conjugate vaccine - Pneumococcal conjugate vaccine 13-valent

## 2016-06-30 ENCOUNTER — Emergency Department (HOSPITAL_COMMUNITY): Payer: PPO

## 2016-06-30 ENCOUNTER — Inpatient Hospital Stay (HOSPITAL_COMMUNITY)
Admission: EM | Admit: 2016-06-30 | Discharge: 2016-07-02 | DRG: 179 | Disposition: A | Discharge status: Skilled Nursing Facility | Payer: PPO | Attending: Internal Medicine | Admitting: Internal Medicine

## 2016-06-30 ENCOUNTER — Encounter (HOSPITAL_COMMUNITY): Payer: Self-pay

## 2016-06-30 DIAGNOSIS — Z87891 Personal history of nicotine dependence: Secondary | ICD-10-CM | POA: Diagnosis not present

## 2016-06-30 DIAGNOSIS — Z993 Dependence on wheelchair: Secondary | ICD-10-CM

## 2016-06-30 DIAGNOSIS — Z85828 Personal history of other malignant neoplasm of skin: Secondary | ICD-10-CM | POA: Diagnosis not present

## 2016-06-30 DIAGNOSIS — R109 Unspecified abdominal pain: Secondary | ICD-10-CM

## 2016-06-30 DIAGNOSIS — H409 Unspecified glaucoma: Secondary | ICD-10-CM | POA: Diagnosis not present

## 2016-06-30 DIAGNOSIS — Z7189 Other specified counseling: Secondary | ICD-10-CM

## 2016-06-30 DIAGNOSIS — J181 Lobar pneumonia, unspecified organism: Secondary | ICD-10-CM | POA: Diagnosis not present

## 2016-06-30 DIAGNOSIS — N401 Enlarged prostate with lower urinary tract symptoms: Secondary | ICD-10-CM

## 2016-06-30 DIAGNOSIS — R1314 Dysphagia, pharyngoesophageal phase: Secondary | ICD-10-CM | POA: Diagnosis not present

## 2016-06-30 DIAGNOSIS — K5900 Constipation, unspecified: Secondary | ICD-10-CM | POA: Diagnosis present

## 2016-06-30 DIAGNOSIS — R413 Other amnesia: Secondary | ICD-10-CM | POA: Diagnosis present

## 2016-06-30 DIAGNOSIS — Z8249 Family history of ischemic heart disease and other diseases of the circulatory system: Secondary | ICD-10-CM

## 2016-06-30 DIAGNOSIS — Z8619 Personal history of other infectious and parasitic diseases: Secondary | ICD-10-CM

## 2016-06-30 DIAGNOSIS — M1990 Unspecified osteoarthritis, unspecified site: Secondary | ICD-10-CM | POA: Diagnosis not present

## 2016-06-30 DIAGNOSIS — Z79899 Other long term (current) drug therapy: Secondary | ICD-10-CM

## 2016-06-30 DIAGNOSIS — J690 Pneumonitis due to inhalation of food and vomit: Principal | ICD-10-CM | POA: Diagnosis present

## 2016-06-30 DIAGNOSIS — F0390 Unspecified dementia without behavioral disturbance: Secondary | ICD-10-CM | POA: Diagnosis present

## 2016-06-30 DIAGNOSIS — Z86010 Personal history of colonic polyps: Secondary | ICD-10-CM | POA: Diagnosis not present

## 2016-06-30 DIAGNOSIS — N400 Enlarged prostate without lower urinary tract symptoms: Secondary | ICD-10-CM | POA: Diagnosis present

## 2016-06-30 DIAGNOSIS — J189 Pneumonia, unspecified organism: Secondary | ICD-10-CM | POA: Diagnosis present

## 2016-06-30 DIAGNOSIS — E785 Hyperlipidemia, unspecified: Secondary | ICD-10-CM | POA: Diagnosis not present

## 2016-06-30 DIAGNOSIS — N138 Other obstructive and reflux uropathy: Secondary | ICD-10-CM | POA: Diagnosis present

## 2016-06-30 DIAGNOSIS — Z66 Do not resuscitate: Secondary | ICD-10-CM | POA: Diagnosis not present

## 2016-06-30 DIAGNOSIS — R918 Other nonspecific abnormal finding of lung field: Secondary | ICD-10-CM | POA: Diagnosis not present

## 2016-06-30 DIAGNOSIS — R1011 Right upper quadrant pain: Secondary | ICD-10-CM | POA: Diagnosis not present

## 2016-06-30 HISTORY — DX: Neoplasm of unspecified behavior of digestive system: D49.0

## 2016-06-30 LAB — LIPASE, BLOOD: Lipase: 19 U/L (ref 11–51)

## 2016-06-30 LAB — CBC
HCT: 37.9 % — ABNORMAL LOW (ref 39.0–52.0)
HEMOGLOBIN: 12.5 g/dL — AB (ref 13.0–17.0)
MCH: 30.3 pg (ref 26.0–34.0)
MCHC: 33 g/dL (ref 30.0–36.0)
MCV: 92 fL (ref 78.0–100.0)
Platelets: 168 10*3/uL (ref 150–400)
RBC: 4.12 MIL/uL — AB (ref 4.22–5.81)
RDW: 13.6 % (ref 11.5–15.5)
WBC: 12.7 10*3/uL — ABNORMAL HIGH (ref 4.0–10.5)

## 2016-06-30 LAB — COMPREHENSIVE METABOLIC PANEL
ALK PHOS: 79 U/L (ref 38–126)
ALT: 12 U/L — ABNORMAL LOW (ref 17–63)
ANION GAP: 7 (ref 5–15)
AST: 18 U/L (ref 15–41)
Albumin: 3.6 g/dL (ref 3.5–5.0)
BILIRUBIN TOTAL: 1.1 mg/dL (ref 0.3–1.2)
BUN: 22 mg/dL — ABNORMAL HIGH (ref 6–20)
CALCIUM: 8.7 mg/dL — AB (ref 8.9–10.3)
CO2: 26 mmol/L (ref 22–32)
Chloride: 104 mmol/L (ref 101–111)
Creatinine, Ser: 0.9 mg/dL (ref 0.61–1.24)
Glucose, Bld: 107 mg/dL — ABNORMAL HIGH (ref 65–99)
Potassium: 3.9 mmol/L (ref 3.5–5.1)
Sodium: 137 mmol/L (ref 135–145)
TOTAL PROTEIN: 6.9 g/dL (ref 6.5–8.1)

## 2016-06-30 MED ORDER — DOCUSATE SODIUM 100 MG PO CAPS
100.0000 mg | ORAL_CAPSULE | Freq: Every day | ORAL | Status: DC | PRN
Start: 2016-06-30 — End: 2016-07-02
  Administered 2016-07-01: 100 mg via ORAL
  Filled 2016-06-30 (×2): qty 1

## 2016-06-30 MED ORDER — DEXTROSE 5 % IV SOLN
1.0000 g | Freq: Once | INTRAVENOUS | Status: AC
  Administered 2016-06-30: 1 g via INTRAVENOUS
  Filled 2016-06-30: qty 10

## 2016-06-30 MED ORDER — FINASTERIDE 5 MG PO TABS
5.0000 mg | ORAL_TABLET | Freq: Every day | ORAL | Status: DC
  Administered 2016-07-01 – 2016-07-02 (×2): 5 mg via ORAL
  Filled 2016-06-30 (×2): qty 1

## 2016-06-30 MED ORDER — SODIUM CHLORIDE 0.9 % IV SOLN
1000.0000 mL | INTRAVENOUS | Status: DC
  Administered 2016-06-30: 1000 mL via INTRAVENOUS

## 2016-06-30 MED ORDER — ONDANSETRON HCL 4 MG/2ML IJ SOLN: 4.0000 mg | Freq: Four times a day (QID) | INTRAMUSCULAR | Status: DC | PRN

## 2016-06-30 MED ORDER — IOPAMIDOL (ISOVUE-300) INJECTION 61%
100.0000 mL | Freq: Once | INTRAVENOUS | Status: AC | PRN
  Administered 2016-06-30: 100 mL via INTRAVENOUS

## 2016-06-30 MED ORDER — FENTANYL CITRATE (PF) 100 MCG/2ML IJ SOLN
50.0000 ug | Freq: Once | INTRAMUSCULAR | Status: AC
  Administered 2016-06-30: 50 ug via INTRAVENOUS
  Filled 2016-06-30: qty 2

## 2016-06-30 MED ORDER — POLYETHYLENE GLYCOL 3350 17 G PO PACK
17.0000 g | PACK | Freq: Every day | ORAL | Status: DC | PRN
  Administered 2016-07-02: 17 g via ORAL
  Filled 2016-06-30: qty 1

## 2016-06-30 MED ORDER — DORZOLAMIDE HCL-TIMOLOL MAL 2-0.5 % OP SOLN: 1.0000 [drp] | Freq: Two times a day (BID) | OPHTHALMIC | Status: DC

## 2016-06-30 MED ORDER — DEXTROSE 5 % IV SOLN
500.0000 mg | Freq: Once | INTRAVENOUS | Status: AC
  Administered 2016-06-30: 500 mg via INTRAVENOUS
  Filled 2016-06-30: qty 500

## 2016-06-30 MED ORDER — TAMSULOSIN HCL 0.4 MG PO CAPS
0.4000 mg | ORAL_CAPSULE | Freq: Every day | ORAL | Status: DC
  Administered 2016-07-01: 0.4 mg via ORAL
  Filled 2016-06-30 (×2): qty 1

## 2016-06-30 MED ORDER — MORPHINE SULFATE (PF) 2 MG/ML IV SOLN: 2.0000 mg | INTRAVENOUS | Status: DC | PRN

## 2016-06-30 MED ORDER — ONDANSETRON HCL 4 MG/2ML IJ SOLN
4.0000 mg | Freq: Four times a day (QID) | INTRAMUSCULAR | Status: DC | PRN
  Administered 2016-06-30: 4 mg via INTRAVENOUS
  Filled 2016-06-30: qty 2

## 2016-06-30 MED ORDER — DEXTROSE 5 % IV SOLN
500.0000 mg | INTRAVENOUS | Status: DC
  Administered 2016-07-01: 500 mg via INTRAVENOUS
  Filled 2016-06-30: qty 500

## 2016-06-30 MED ORDER — SODIUM CHLORIDE 0.9 % IV SOLN
1000.0000 mL | Freq: Once | INTRAVENOUS | Status: AC
  Administered 2016-06-30: 1000 mL via INTRAVENOUS

## 2016-06-30 MED ORDER — ENOXAPARIN SODIUM 30 MG/0.3ML ~~LOC~~ SOLN
30.0000 mg | SUBCUTANEOUS | Status: DC
  Administered 2016-07-01: 30 mg via SUBCUTANEOUS
  Filled 2016-06-30: qty 0.3

## 2016-06-30 MED ORDER — ALBUTEROL SULFATE (2.5 MG/3ML) 0.083% IN NEBU: 2.5000 mg | INHALATION_SOLUTION | RESPIRATORY_TRACT | Status: DC | PRN

## 2016-06-30 MED ORDER — TIMOLOL MALEATE 0.5 % OP SOLN
1.0000 [drp] | Freq: Two times a day (BID) | OPHTHALMIC | Status: DC
  Administered 2016-07-01: 1 [drp] via OPHTHALMIC
  Filled 2016-06-30: qty 5

## 2016-06-30 MED ORDER — LACTATED RINGERS IV SOLN
INTRAVENOUS | Status: DC
  Administered 2016-07-01: via INTRAVENOUS

## 2016-06-30 MED ORDER — DORZOLAMIDE HCL 2 % OP SOLN
1.0000 [drp] | Freq: Two times a day (BID) | OPHTHALMIC | Status: DC
  Administered 2016-07-01 (×2): 1 [drp] via OPHTHALMIC
  Filled 2016-06-30: qty 10

## 2016-06-30 MED ORDER — ACETAMINOPHEN 325 MG PO TABS: 650.0000 mg | ORAL_TABLET | Freq: Four times a day (QID) | ORAL | Status: DC | PRN

## 2016-06-30 MED ORDER — DEXTROSE 5 % IV SOLN: 250.0000 mg | INTRAVENOUS | Status: DC

## 2016-06-30 MED ORDER — LATANOPROST 0.005 % OP SOLN
1.0000 [drp] | Freq: Every day | OPHTHALMIC | Status: DC
  Administered 2016-07-01 (×2): 1 [drp] via OPHTHALMIC
  Filled 2016-06-30: qty 2.5

## 2016-06-30 MED ORDER — DEXTROSE 5 % IV SOLN
1.0000 g | INTRAVENOUS | Status: DC
  Administered 2016-07-01: 1 g via INTRAVENOUS
  Filled 2016-06-30: qty 10

## 2016-06-30 NOTE — ED Notes (Signed)
Lynn/step-daughter  [phone]

## 2016-06-30 NOTE — ED Notes (Signed)
Patient transported to CT 

## 2016-06-30 NOTE — ED Provider Notes (Addendum)
7:30 PM patient resting comfortably. Appears in no  distress. Dr. Lorin Mercy consulted plan admission MedSurg floor. Intravenous antibiotics. I offered palliative care consult to patient and family which they decline. Patient's wife states that she may need help managing him once discharged. I suggest social services consult. Results for orders placed or performed during the hospital encounter of 06/30/16  Lipase, blood  Result Value Ref Range   Lipase 19 11 - 51 U/L  Comprehensive metabolic panel  Result Value Ref Range   Sodium 137 135 - 145 mmol/L   Potassium 3.9 3.5 - 5.1 mmol/L   Chloride 104 101 - 111 mmol/L   CO2 26 22 - 32 mmol/L   Glucose, Bld 107 (H) 65 - 99 mg/dL   BUN 22 (H) 6 - 20 mg/dL   Creatinine, Ser 0.90 0.61 - 1.24 mg/dL   Calcium 8.7 (L) 8.9 - 10.3 mg/dL   Total Protein 6.9 6.5 - 8.1 g/dL   Albumin 3.6 3.5 - 5.0 g/dL   AST 18 15 - 41 U/L   ALT 12 (L) 17 - 63 U/L   Alkaline Phosphatase 79 38 - 126 U/L   Total Bilirubin 1.1 0.3 - 1.2 mg/dL   GFR calc non Af Amer >60 >60 mL/min   GFR calc Af Amer >60 >60 mL/min   Anion gap 7 5 - 15  CBC  Result Value Ref Range   WBC 12.7 (H) 4.0 - 10.5 K/uL   RBC 4.12 (L) 4.22 - 5.81 MIL/uL   Hemoglobin 12.5 (L) 13.0 - 17.0 g/dL   HCT 37.9 (L) 39.0 - 52.0 %   MCV 92.0 78.0 - 100.0 fL   MCH 30.3 26.0 - 34.0 pg   MCHC 33.0 30.0 - 36.0 g/dL   RDW 13.6 11.5 - 15.5 %   Platelets 168 150 - 400 K/uL   Dg Chest 2 View  Result Date: 06/30/2016 CLINICAL DATA:  Right upper quadrant pain and tachypnea. EXAM: CHEST  2 VIEW COMPARISON:  None. FINDINGS: Shallow lung inflation. Mild right parahilar airspace opacities. No pneumothorax or sizable pleural effusion. No pulmonary edema. Atherosclerotic calcification within the aortic arch. IMPRESSION: 1. Right parahilar airspace opacities, possibly exaggerated by shallow lung inflation, may represent central pulmonary vascular congestion. 2. Aortic atherosclerosis. Electronically Signed   By: Ulyses Jarred M.D.   On: 06/30/2016 17:25   Ct Abdomen Pelvis W Contrast  Result Date: 06/30/2016 CLINICAL DATA:  Right upper quadrant abdominal pain for 3 days. EXAM: CT ABDOMEN AND PELVIS WITH CONTRAST TECHNIQUE: Multidetector CT imaging of the abdomen and pelvis was performed using the standard protocol following bolus administration of intravenous contrast. CONTRAST:  15mL ISOVUE-300 IOPAMIDOL (ISOVUE-300) INJECTION 61% COMPARISON:  MRI of the lumbar spine from 12/30/2015, FINDINGS: Lower chest: Patchy airspace disease in the right lower lobe with small pleural effusion. Left lower lobe atelectasis. Top normal-sized cardiac chambers without pericardial effusion. Coronary arteriosclerosis noted. Hepatobiliary: No focal liver abnormality is seen. No gallstones, gallbladder wall thickening, or biliary dilatation. Pancreas: Unremarkable. No pancreatic ductal dilatation or surrounding inflammatory changes. Spleen: Normal in size without focal abnormality. Adrenals/Urinary Tract: Adrenal glands are unremarkable. Variable sized renal cysts are seen, the largest is on the left measuring 4.7 x 4.7 x 4.3 cm in the mid to upper pole. Several small exophytic cysts are noted, one anteriorly and two posterolaterally on the right measuring up to 2 cm. There is bladder diverticulosis most of which are shallow and broad-based in appearance. There is mild thickening of the  bladder mucosa superiorly up to 7-8 mm. No bladder calculi. Stomach/Bowel: Debris filled duodenum diverticulum measuring 3.9 cm in diameter. Small lipoma noted in the distal ileum, series 3, image 60 and series 4, image 71 measuring up to 1.1 cm in diameter. Bilateral inguinal hernias containing small and large bowel on the right and large bowel in the left. No bowel obstruction. No acute inflammation. Vascular/Lymphatic: Atherosclerotic appearance of the abdominal aorta without aneurysm. Ectatic appearance of the common iliac arteries. No focal aneurysm.  Reproductive: Enlarged prostate measuring up to 5.6 cm AP x 6.3 cm transverse x 6.3 cm craniocaudad. Other: No free air nor free fluid. Musculoskeletal: Chronic T11, L1, L2 and L5 compression fractures. Degenerative vacuum disc phenomenon from L2 through L5. Slight chronic minimal retrolisthesis of L2 on L3. IMPRESSION: 1. Patchy airspace disease at the right lung base with small pleural effusion. Right lower lobe pneumonia with parapneumonic effusion might have this appearance. This may contribute to right upper quadrant symptoms. 2. Duodenum diverticulum measuring 3.9 cm containing internal debris off the second portion. 3. Enlarged prostate with mildly distended and thick-walled bladder which may reflect chronic cystitis. There is bladder diverticulosis. 4. Bilateral renal cysts. 5. Chronic compression fractures of T11, L1, L2 and L5. Electronically Signed   By: Ashley Royalty M.D.   On: 06/30/2016 18:48   US Abdomen Limited Ruq  Result Date: 06/30/2016 CLINICAL DATA:  Right upper quadrant pain. EXAM: US ABDOMEN LIMITED - RIGHT UPPER QUADRANT COMPARISON:  MRI 12/30/2015. FINDINGS: Gallbladder: No gallstones or wall thickening visualized. No sonographic Murphy sign noted by sonographer. Common bile duct: Diameter: 5 mm Liver: No focal lesion identified. Within normal limits in parenchymal echogenicity. Incidental note is made of simple renal cysts in the right kidney. Exam limited by overlying bowel gas. IMPRESSION: No acute abnormality noted. Specifically no evidence of gallstones or biliary distention. Limited exam due to bowel gas. Electronically Signed   By: Marcello Moores  Register   On: 06/30/2016 17:16   Dx Community acquired pneumonia   Orlie Dakin, MD 06/30/16 Dreama Saa, MD 06/30/16 2004

## 2016-06-30 NOTE — H&P (Signed)
History and Physical    Joel [name] MRN:[id number] DOB: 02/19/1921 DOA: 06/30/2016  PCP: Arthur Green, MD Consultants:  None Patient coming from: independently living - [facility]  Chief Complaint: RUQ pain  HPI: Joel [name] is a 82 y.o. male with medical history significant of mild-moderate dementia, glaucoma, constipation, and BPH presenting with RUQ pain.  Symptoms started Saturday with severe abdominal pain after dinner, RUQ pain with distention.  Wife thought it was indigestion, gave him Pepcid.  After several hours, he was able to rest.  Didn't happen again until the next night although he was very somnolent the entire next day.  No cough, no SOB.  Slight wheezing.  Simliar episode Sunday night and again today after lunch.  No SOB.  Wheelchair dependent, doesn't do much.  ?aspiration.  Had a very bad choking episode on Friday night at/after dinner.  Choking episode for a few minutes, and then coughing for another 10-15 minutes afterward.  Ongoing confusion, gradually worsening - but no different now than at current baseline.  Colicky RUQ pain, groaned for about 90 minutes.  Even some pain with palpating RUQ.    His family has already been thinking he may be ready to transition to skilled nursing and they have been in talks with the facility where they live about this.  Wife will remain in their apartment.  The entire family is also very certain that the patient is nearing the end of his life, and they are at peace with this.  Their utmost concern is that he be comfortable at all costs.  They are in agreement with use of antibiotics for treatment of pneumonia and with swallow evaluation and dysphagia diet, but they would prefer comfort feeds regardless of aspiration risk.  They are also very clear that they would not want to escalate care in any way.  Absolutely no transfer to ICU, no BIPAP/CPAP, no pressors.  If the patient's condition worsens or if he develops recurrent episodes  of aspiration PNA, they would want to transition to Hospice.   ED Course: Per Dr. Campos: Presentation concerning for possible right lower lobe pneumonia as the cause of his right upper quadrant abdominal pain as this could be causing some diaphragmatic irritation.  Patient be started on Rocephin and azithromycin.  Ultrasound of his right upper quadrant shows no abnormalities.  He will undergo CT imaging of the right side of his abdomen to further evaluate if there is an intra-abdominal cause to his symptoms.  Review of Systems: As per HPI; otherwise 10 point review of systems reviewed and negative.   Ambulatory Status:  Walks only with assistace, primarily sits in wheelchair  Past Medical History:  Diagnosis Date  . Colon tumor 1985  . Elevated prostate specific antigen (PSA) 05/13/2011  . Herpes zoster 05/13/2011  . Herpes zoster with ophthalmic complication 05/02/2011  . Hypertrophy of prostate without urinary obstruction and other lower urinary tract symptoms (LUTS) 09/20/2010  . Memory loss 03/25/2009  . 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 Nodular prostate without urinary obstruction 05/18/2012  . Osteoarthrosis, unspecified whether generalized or localized, unspecified site 09/20/2010  . Other and unspecified hyperlipidemia 09/20/2010  . Personal history of colonic polyps 09/20/2010  . Unspecified constipation 09/24/2010  . Unspecified glaucoma(365.9) 09/20/2010   bilateral    Past Surgical History:  Procedure Laterality Date  . APPENDECTOMY  1940  . CATARACT EXTRACTION W/ INTRAOCULAR LENS IMPLANT Right 2010   Dr. McCuen  . COLECTOMY    07/17/1997   sigmoid submucosal lipoma Dr. Edwards  . COLONOSCOPY  2000   normal  . COLONOSCOPY  04/21/2002   polypectomy  . COLONOSCOPY  06/10/2004   no polyps  . SKIN CANCER EXCISION Right 05/19/2013   ear Dr. Goodwich  . TONSILLECTOMY  1938  . TUMOR REMOVAL  1993   Removal of Benign Intestinal Tumor   . VASECTOMY  1972     Social History   Social History  . Marital status: Married    Spouse name: N/A  . Number of children: N/A  . Years of education: N/A   Occupational History  . retired Insurance agent    Social History Main Topics  . Smoking status: Former Smoker    Quit date: 04/12/1974  . Smokeless tobacco: Never Used  . Alcohol use No     Comment: none in several years  . Drug use: No  . Sexual activity: Not on file   Other Topics Concern  . Not on file   Social History Narrative   Lives at [facility] with wife, moved in 02/10/2006   Former smoker -stopped 1975   Alcohol none   Exercise 6 days a week   POA, MOST, DNR, Living Will    No Known Allergies  Family History  Problem Relation Age of Onset  . Heart disease Father     Prior to Admission medications   Medication Sig Start Date End Date Taking? Authorizing Provider  acetaminophen (TYLENOL) 650 MG CR tablet Take 1,300 mg by mouth at bedtime.   Yes Historical Provider, MD  docusate sodium (COLACE) 100 MG capsule Take 100 mg by mouth daily as needed for mild constipation.    Yes Historical Provider, MD  dorzolamide-timolol (COSOPT) 22.3-6.8 MG/ML ophthalmic solution Place 1 drop into both eyes 2 (two) times daily. 05/21/16  Yes Historical Provider, MD  finasteride (PROSCAR) 5 MG tablet Take one tablet by mouth once daily to shrink the prostate Patient taking differently: Take 5 mg by mouth daily.  12/10/15  Yes Arthur G Green, MD  latanoprost (XALATAN) 0.005 % ophthalmic solution Place 1 drop into both eyes at bedtime.  03/16/13  Yes Historical Provider, MD  polyethylene glycol (MIRALAX / GLYCOLAX) packet Take 17 g by mouth daily as needed for moderate constipation.   Yes Historical Provider, MD  tamsulosin (FLOMAX) 0.4 MG CAPS capsule TAKE ONE CAPSULE BY MOUTH ONCE DAILY TO HELP PROSTATE AND TO REDUCE URINARY FREQUENCY Patient taking differently: TAKE 0.4mg CAPSULE BY MOUTH ONCE DAILY 05/12/16  Yes Arthur G Green, MD  Multiple  Vitamins-Minerals (CENTRUM) tablet Take 1 tablet by mouth daily.    Historical Provider, MD    Physical Exam: Vitals:   06/30/16 1615 06/30/16 1845 06/30/16 1900 06/30/16 1937  BP: 137/73  149/68 141/65  Pulse: 83 81  86  Resp: 23 19 20 20  Temp:    98.4 F (36.9 C)  TempSrc:    Oral  SpO2: 96% 99%  97%  Weight:      Height:         General: Appears calm and comfortable and is NAD; audible wheezing once while I was in the room Eyes:  PERRL, EOMI, normal lids, iris ENT:  grossly normal hearing, lips & tongue, mmm Neck:  no LAD, masses or thyromegaly Cardiovascular:  RRR, no m/r/g. 1-2+ LE edema that is at baseline as per wife.  Respiratory:  LLL ronchi (despite diagnosis of RLL PNA). Normal respiratory effort.  No wheezes during exam. Abdomen:    soft, nt, mildly distended (wife says this is new), NABS Skin:  no rash or induration seen on limited exam Musculoskeletal:  grossly normal tone BUE/BLE, good ROM, no bony abnormality Psychiatric:  grossly normal mood and affect, speech fluent and appropriate, AOx2, shortened attention span Neurologic:  CN 2-12 grossly intact, moves all extremities in coordinated fashion, sensation intact  Labs on Admission: I have personally reviewed following labs and imaging studies  CBC:  Recent Labs Lab 06/30/16 1420  WBC 12.7*  HGB 12.5*  HCT 37.9*  MCV 92.0  PLT 426   Basic Metabolic Panel:  Recent Labs Lab 06/30/16 1420  NA 137  K 3.9  CL 104  CO2 26  GLUCOSE 107*  BUN 22*  CREATININE 0.90  CALCIUM 8.7*   GFR: Estimated Creatinine Clearance: 49.6 mL/min (by C-G formula based on SCr of 0.9 mg/dL). Liver Function Tests:  Recent Labs Lab 06/30/16 1420  AST 18  ALT 12*  ALKPHOS 79  BILITOT 1.1  PROT 6.9  ALBUMIN 3.6    Recent Labs Lab 06/30/16 1420  LIPASE 19   No results for input(s): AMMONIA in the last 168 hours. Coagulation Profile: No results for input(s): INR, PROTIME in the last 168 hours. Cardiac  Enzymes: No results for input(s): CKTOTAL, CKMB, CKMBINDEX, TROPONINI in the last 168 hours. BNP (last 3 results) No results for input(s): PROBNP in the last 8760 hours. HbA1C: No results for input(s): HGBA1C in the last 72 hours. CBG: No results for input(s): GLUCAP in the last 168 hours. Lipid Profile: No results for input(s): CHOL, HDL, LDLCALC, TRIG, CHOLHDL, LDLDIRECT in the last 72 hours. Thyroid Function Tests: No results for input(s): TSH, T4TOTAL, FREET4, T3FREE, THYROIDAB in the last 72 hours. Anemia Panel: No results for input(s): VITAMINB12, FOLATE, FERRITIN, TIBC, IRON, RETICCTPCT in the last 72 hours. Urine analysis: No results found for: COLORURINE, APPEARANCEUR, LABSPEC, PHURINE, GLUCOSEU, HGBUR, BILIRUBINUR, KETONESUR, PROTEINUR, UROBILINOGEN, NITRITE, LEUKOCYTESUR  Creatinine Clearance: Estimated Creatinine Clearance: 49.6 mL/min (by C-G formula based on SCr of 0.9 mg/dL).  Sepsis Labs: _0 (procalcitonin:4,lacticidven:4) )No results found for this or any previous visit (from the past 240 hour(s)).   Radiological Exams on Admission: Dg Chest 2 View  Result Date: 06/30/2016 CLINICAL DATA:  Right upper quadrant pain and tachypnea. EXAM: CHEST  2 VIEW COMPARISON:  None. FINDINGS: Shallow lung inflation. Mild right parahilar airspace opacities. No pneumothorax or sizable pleural effusion. No pulmonary edema. Atherosclerotic calcification within the aortic arch. IMPRESSION: 1. Right parahilar airspace opacities, possibly exaggerated by shallow lung inflation, may represent central pulmonary vascular congestion. 2. Aortic atherosclerosis. Electronically Signed   By: Ulyses Jarred M.D.   On: 06/30/2016 17:25   Ct Abdomen Pelvis W Contrast  Result Date: 06/30/2016 CLINICAL DATA:  Right upper quadrant abdominal pain for 3 days. EXAM: CT ABDOMEN AND PELVIS WITH CONTRAST TECHNIQUE: Multidetector CT imaging of the abdomen and pelvis was performed using the standard protocol  following bolus administration of intravenous contrast. CONTRAST:  133m ISOVUE-300 IOPAMIDOL (ISOVUE-300) INJECTION 61% COMPARISON:  MRI of the lumbar spine from 12/30/2015, FINDINGS: Lower chest: Patchy airspace disease in the right lower lobe with small pleural effusion. Left lower lobe atelectasis. Top normal-sized cardiac chambers without pericardial effusion. Coronary arteriosclerosis noted. Hepatobiliary: No focal liver abnormality is seen. No gallstones, gallbladder wall thickening, or biliary dilatation. Pancreas: Unremarkable. No pancreatic ductal dilatation or surrounding inflammatory changes. Spleen: Normal in size without focal abnormality. Adrenals/Urinary Tract: Adrenal glands are unremarkable. Variable sized renal cysts are seen, the largest  is on the left measuring 4.7 x 4.7 x 4.3 cm in the mid to upper pole. Several small exophytic cysts are noted, one anteriorly and two posterolaterally on the right measuring up to 2 cm. There is bladder diverticulosis most of which are shallow and broad-based in appearance. There is mild thickening of the bladder mucosa superiorly up to 7-8 mm. No bladder calculi. Stomach/Bowel: Debris filled duodenum diverticulum measuring 3.9 cm in diameter. Small lipoma noted in the distal ileum, series 3, image 60 and series 4, image 71 measuring up to 1.1 cm in diameter. Bilateral inguinal hernias containing small and large bowel on the right and large bowel in the left. No bowel obstruction. No acute inflammation. Vascular/Lymphatic: Atherosclerotic appearance of the abdominal aorta without aneurysm. Ectatic appearance of the common iliac arteries. No focal aneurysm. Reproductive: Enlarged prostate measuring up to 5.6 cm AP x 6.3 cm transverse x 6.3 cm craniocaudad. Other: No free air nor free fluid. Musculoskeletal: Chronic T11, L1, L2 and L5 compression fractures. Degenerative vacuum disc phenomenon from L2 through L5. Slight chronic minimal retrolisthesis of L2 on L3.  IMPRESSION: 1. Patchy airspace disease at the right lung base with small pleural effusion. Right lower lobe pneumonia with parapneumonic effusion might have this appearance. This may contribute to right upper quadrant symptoms. 2. Duodenum diverticulum measuring 3.9 cm containing internal debris off the second portion. 3. Enlarged prostate with mildly distended and thick-walled bladder which may reflect chronic cystitis. There is bladder diverticulosis. 4. Bilateral renal cysts. 5. Chronic compression fractures of T11, L1, L2 and L5. Electronically Signed   By: Ashley Royalty M.D.   On: 06/30/2016 18:48   US Abdomen Limited Ruq  Result Date: 06/30/2016 CLINICAL DATA:  Right upper quadrant pain. EXAM: US ABDOMEN LIMITED - RIGHT UPPER QUADRANT COMPARISON:  MRI 12/30/2015. FINDINGS: Gallbladder: No gallstones or wall thickening visualized. No sonographic Murphy sign noted by sonographer. Common bile duct: Diameter: 5 mm Liver: No focal lesion identified. Within normal limits in parenchymal echogenicity. Incidental note is made of simple renal cysts in the right kidney. Exam limited by overlying bowel gas. IMPRESSION: No acute abnormality noted. Specifically no evidence of gallstones or biliary distention. Limited exam due to bowel gas. Electronically Signed   By: Marcello Moores  Register   On: 06/30/2016 17:16    EKG: Not done  Assessment/Plan Principal Problem:   CAP (community acquired pneumonia) Active Problems:   BPH (benign prostatic hyperplasia)   Memory loss   Dysphagia, pharyngoesophageal phase   Goals of care, counseling/discussion   CAP, RLL -Location is not suggestive of aspiration PNA, although patient is clearly at risk -Presented with RUQ abdominal pain after eating, but CT negative for gallbladder disease but positive for RLL PNA -Pneumonia Severity Index (PSI) is 103, Class IV mortality (priarmily due to age) -Azithromycin 500 mg IV x 1 and then 250 mg daily x 5 days AND Rocephin. - LR @ 50  cc/hr - Fever control - Repeat CBC in am - Sputum cultures - Blood cultures - albuterol PRN - O2 prn  Memory loss -Patient is not on medication for this, but appears to have mild-moderate cognitive impairment from dementia -He is at risk for sundowning -I have encouraged his family to stay with him overnight to help with reorienting as needed -He may require Haldol  Glaucoma -Continue Cosopt, Xalatan  BPH -Continue Flomax and Proscar  Goals of care -Family is so very appropriate and is clearly focused on patient's comfort -We spent a long time discussing  exactly what their desires are and I will try to clearly explain them so that they don't have to go through this discussion multiple times. -Their primary objective is for the comfort of this patient. -They are in agreement with antibiotics for now, but if this problem recurs they are likely to seek hospice support. -They want NO FURTHER ESCALATION in this patient's care - to include transfer to a higher level of care, non-invasive ventilatory assistance including BIPAP/CPAP, pressors, etc. -They would like to have a swallow evaluation and are willing to proceed with a dysphagia diet, but they do prefer that he have comfort feeds even with the understanding that he is likely to aspirate. -The daughter is a Designer, jewellery with experience in geriatrics and palliative care.  At this time, they do not feel the need for a palliative care consult as they understand the process and have their needs met.      DVT prophylaxis: Lovenox  Code Status: DNR - confirmed with patient/family Family Communication: Wife and daughter present throughout evaluation Disposition Plan:  To be determined Consults called: None Admission status: Admit to med Surg    Karmen Bongo MD [facility]  If 7PM-7AM, please contact night-coverage [email or web address] Password [email or web address]  06/30/2016, 9:03 PM

## 2016-06-30 NOTE — ED Notes (Signed)
Bed: WA07 Expected date:  Expected time:  Means of arrival:  Comments: 82 yo M abd pain

## 2016-06-30 NOTE — Progress Notes (Signed)
Pharmacy Antibiotic Note  Joel [name] is a 82 y.o. male admitted on 06/30/2016 with CAP. Pharmacy has been consulted for Ceftriaxone dosing, and MD is dosing Azithromycin.  Plan: Ceftriaxone 1g IV q24h. Ceftriaxone does not require renal/hepatic adjustment, so pharmacy will sign off at this time.  -Would suggest increasing Azithromycin dosing to 500mg  IV q24h.  Height: 5\' 8"  (172.7 cm) Weight: 175 lb (79.4 kg) IBW/kg (Calculated) : 68.4  Temp (24hrs), Avg:98.3 F (36.8 C), Min:98.2 F (36.8 C), Max:98.4 F (36.9 C)   Recent Labs Lab 06/30/16 1420  WBC 12.7*  CREATININE 0.90    Estimated Creatinine Clearance: 49.6 mL/min (by C-G formula based on SCr of 0.9 mg/dL).    No Known Allergies  Antimicrobials this admission: 10/2 >> Ceftriaxone >> 10/2 >> Azithromycin (MD) >>  Thank you for allowing pharmacy to be a part of this patient's care.   Lindell Spar, PharmD, BCPS Pager: [phone] 06/30/2016 9:37 PM

## 2016-06-30 NOTE — ED Triage Notes (Signed)
Per EMS- Patient is a resident of [place]. Patient c/o RUQ pain x 3 days, and states the pain began after eating 3 days ago.

## 2016-06-30 NOTE — ED Provider Notes (Signed)
[facility] DEPT Provider Note   CSN: [id number] Arrival date & time: 06/30/16  1348     History   Chief Complaint Chief Complaint  Patient presents with  . Abdominal Pain    HPI Joel [name] is a 82 y.o. male.  HPI Patient presents the emergency department with 48 hours of right upper quadrant abdominal pain worse after food.  He's never had pain like this before.  He still has his gallbladder.  Family also notes that he has been breathing slightly faster.  No reports of vomiting or diarrhea.  He has continued keeping oral fluids down through the weekend.  No recent cough.  Symptoms are moderate in severity.  In general is had increasing weakness over the past for 5 months to the point where now he has to be portion of wheelchair.   Past Medical History:  Diagnosis Date  . Elevated prostate specific antigen (PSA) 05/13/2011  . Herpes zoster 05/13/2011  . Herpes zoster with ophthalmic complication [id number]  . Hypertrophy of prostate without urinary obstruction and other lower urinary tract symptoms (LUTS) 09/20/2010  . Memory loss 03/25/2009  . 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 Nodular prostate without urinary obstruction 05/18/2012  . Osteoarthrosis, unspecified whether generalized or localized, unspecified site 09/20/2010  . Other and unspecified hyperlipidemia 09/20/2010  . Personal history of colonic polyps 09/20/2010  . Unspecified constipation 09/24/2010  . Unspecified glaucoma(365.9) 09/20/2010   bilateral    Patient Active Problem List   Diagnosis Date Noted  . Weakness of right leg 02/05/2016  . 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 Fall 02/20/2015  . Lumbosacral pain 02/20/2015  . Pain of left leg 12/15/2014  . Pain in left shoulder 11/21/2014  . Dysphagia, pharyngoesophageal phase 02/14/2014  . Dizzy 09/27/2013  . Arrhythmia, sinus node ([place]) 09/27/2013  . 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  Nodular prostate with urinary obstruction 04/12/2013  . Elevated prostate specific antigen (PSA) 05/13/2011  . Dyslipidemia 09/20/2010  . Osteoarthrosis, unspecified whether generalized or localized, unspecified site 09/20/2010  . Memory loss 03/25/2009    Past Surgical History:  Procedure Laterality Date  . APPENDECTOMY  1940  . CATARACT EXTRACTION W/ INTRAOCULAR LENS IMPLANT Right 2010   Dr. Ellie Lunch  . COLECTOMY  07/17/1997   sigmoid submucosal lipoma Dr. Oletta Lamas  . COLONOSCOPY  2000   normal  . COLONOSCOPY  04/21/2002   polypectomy  . COLONOSCOPY  06/10/2004   no polyps  . SKIN CANCER EXCISION Right 05/19/2013   ear Dr. Dannette Barbara  . TONSILLECTOMY  1938  . TUMOR REMOVAL  1993   Removal of Benign Intestinal Tumor   . [place] Medications    Prior to Admission medications   Medication Sig Start Date End Date Taking? Authorizing Provider  acetaminophen (TYLENOL) 650 MG CR tablet Take 1,300 mg by mouth at bedtime.   Yes Historical Provider, MD  docusate sodium (COLACE) 100 MG capsule Take 100 mg by mouth daily as needed for mild constipation.    Yes Historical Provider, MD  dorzolamide-timolol (COSOPT) 22.3-6.8 MG/ML ophthalmic solution Place 1 drop into both eyes 2 (two) times daily. 05/21/16  Yes Historical Provider, MD  finasteride (PROSCAR) 5 MG tablet Take one tablet by mouth once daily to shrink the prostate Patient taking differently: Take 5 mg by mouth daily.  12/10/15  Yes [name], MD  latanoprost (XALATAN) 0.005 %  ophthalmic solution Place 1 drop into both eyes at bedtime.  03/16/13  Yes Historical Provider, MD  polyethylene glycol (MIRALAX / GLYCOLAX) packet Take 17 g by mouth daily as needed for moderate constipation.   Yes Historical Provider, MD  tamsulosin (FLOMAX) 0.4 MG CAPS capsule TAKE ONE CAPSULE BY MOUTH ONCE DAILY TO HELP PROSTATE AND TO REDUCE URINARY FREQUENCY Patient taking differently: TAKE 0.4mg  CAPSULE BY MOUTH ONCE DAILY 05/12/16   Yes [name], MD  Multiple Vitamins-Minerals (CENTRUM) tablet Take 1 tablet by mouth daily.    Historical Provider, MD    Family History Family History  Problem Relation Age of Onset  . Heart disease Father     Social History Social History  Substance Use Topics  . Smoking status: Former Smoker    Quit date: 04/12/1974  . Smokeless tobacco: Never Used  . Alcohol use Yes     Comment: wine occasionaly     Allergies   Review of patient's allergies indicates no known allergies.   Review of Systems Review of Systems  All other systems reviewed and are negative.    Physical Exam Updated Vital Signs BP 137/73 (BP Location: Left Arm)   Pulse 83   Temp 98.2 F (36.8 C) (Oral)   Resp 23   Ht 5\' 8"  (1.727 m)   Wt 175 lb (79.4 kg)   SpO2 96%   BMI 26.61 kg/m   Physical Exam  Constitutional: He is oriented to person, place, and time. He appears well-developed and well-nourished.  HENT:  Head: Normocephalic and atraumatic.  Eyes: EOM are normal.  Neck: Normal range of motion.  Cardiovascular: Normal rate, regular rhythm, normal heart sounds and intact distal pulses.   Pulmonary/Chest: Effort normal and breath sounds normal. No respiratory distress.  Abdominal: Soft. He exhibits no distension.  Mild right-sided and right upper quadrant abdominal tenderness without guarding or rebound.  Musculoskeletal: Normal range of motion.  Neurological: He is alert and oriented to person, place, and time.  Skin: Skin is warm and dry.  Psychiatric: He has a normal mood and affect. Judgment normal.  Nursing note and vitals reviewed.    ED Treatments / Results  Labs (all labs ordered are listed, but only abnormal results are displayed) Labs Reviewed  COMPREHENSIVE METABOLIC PANEL - Abnormal; Notable for the following:       Result Value   Glucose, Bld 107 (*)    BUN 22 (*)    Calcium 8.7 (*)    ALT 12 (*)    All other components within normal limits  CBC - Abnormal;  Notable for the following:    WBC 12.7 (*)    RBC 4.12 (*)    Hemoglobin 12.5 (*)    HCT 37.9 (*)    All other components within normal limits  LIPASE, BLOOD  URINALYSIS, ROUTINE W REFLEX MICROSCOPIC (NOT AT [facility])    EKG  EKG Interpretation None       Radiology Dg Chest 2 View  Result Date: 06/30/2016 CLINICAL DATA:  Right upper quadrant pain and tachypnea. EXAM: CHEST  2 VIEW COMPARISON:  None. FINDINGS: Shallow lung inflation. Mild right parahilar airspace opacities. No pneumothorax or sizable pleural effusion. No pulmonary edema. Atherosclerotic calcification within the aortic arch. IMPRESSION: 1. Right parahilar airspace opacities, possibly exaggerated by shallow lung inflation, may represent central pulmonary vascular congestion. 2. Aortic atherosclerosis. Electronically Signed   By: Ulyses Jarred M.D.   On: 06/30/2016 17:25   US Abdomen Limited Ruq  Result  Date: 06/30/2016 CLINICAL DATA:  Right upper quadrant pain. EXAM: US ABDOMEN LIMITED - RIGHT UPPER QUADRANT COMPARISON:  MRI 12/30/2015. FINDINGS: Gallbladder: No gallstones or wall thickening visualized. No sonographic Murphy sign noted by sonographer. Common bile duct: Diameter: 5 mm Liver: No focal lesion identified. Within normal limits in parenchymal echogenicity. Incidental note is made of simple renal cysts in the right kidney. Exam limited by overlying bowel gas. IMPRESSION: No acute abnormality noted. Specifically no evidence of gallstones or biliary distention. Limited exam due to bowel gas. Electronically Signed   By: Marcello Moores  Register   On: 06/30/2016 17:16    Procedures Procedures (including critical care time)  Medications Ordered in ED Medications  0.9 %  sodium chloride infusion (1,000 mLs Intravenous New Bag/Given 06/30/16 1552)    Followed by  0.9 %  sodium chloride infusion (not administered)  ondansetron (ZOFRAN) injection 4 mg (4 mg Intravenous Given 06/30/16 1554)  cefTRIAXone (ROCEPHIN) 1 g in dextrose  5 % 50 mL IVPB (not administered)  azithromycin (ZITHROMAX) 500 mg in dextrose 5 % 250 mL IVPB (not administered)  fentaNYL (SUBLIMAZE) injection 50 mcg (50 mcg Intravenous Given 06/30/16 1556)  fentaNYL (SUBLIMAZE) injection 50 mcg (50 mcg Intravenous Given 06/30/16 1631)  iopamidol (ISOVUE-300) 61 % injection 100 mL (100 mLs Intravenous Contrast Given 06/30/16 1802)     Initial Impression / Assessment and Plan / ED Course  I have reviewed the triage vital signs and the nursing notes.  Pertinent labs & imaging results that were available during my care of the patient were reviewed by me and considered in my medical decision making (see chart for details).  Clinical Course    Presentation concerning for possible right lower lobe pneumonia as the cause of his right upper quadrant abdominal pain as this could be causing some diaphragmatic irritation.  Patient be started on Rocephin and azithromycin.  Ultrasound of his right upper quadrant shows no abnormalities.  He will undergo CT imaging of the right side of his abdomen to further evaluate if there is an intra-abdominal cause to his symptoms.  Care to Dr Winfred Leeds to follow up on CT imaging.  I attempted to update the family however there no longer the room.  Hopefully they will return shortly.  Final Clinical Impressions(s) / ED Diagnoses   Final diagnoses:  Abdominal pain    New Prescriptions New Prescriptions   No medications on file     Jola Schmidt, MD 06/30/16 1820

## 2016-07-01 ENCOUNTER — Encounter: Payer: PPO | Admitting: Internal Medicine

## 2016-07-01 DIAGNOSIS — J181 Lobar pneumonia, unspecified organism: Secondary | ICD-10-CM | POA: Diagnosis not present

## 2016-07-01 LAB — BASIC METABOLIC PANEL
Anion gap: 5 (ref 5–15)
BUN: 17 mg/dL (ref 6–20)
CALCIUM: 7.9 mg/dL — AB (ref 8.9–10.3)
CO2: 22 mmol/L (ref 22–32)
CREATININE: 0.86 mg/dL (ref 0.61–1.24)
Chloride: 109 mmol/L (ref 101–111)
GFR calc Af Amer: >60 mL/min (ref >60)
GLUCOSE: 92 mg/dL (ref 65–99)
Potassium: 3.7 mmol/L (ref 3.5–5.1)
Sodium: 136 mmol/L (ref 135–145)

## 2016-07-01 LAB — CBC WITH DIFFERENTIAL/PLATELET
BASOS ABS: 0 10*3/uL (ref 0.0–0.1)
BASOS PCT: 0 %
EOS PCT: 3 %
Eosinophils Absolute: 0.3 10*3/uL (ref 0.0–0.7)
HCT: 32.6 % — ABNORMAL LOW (ref 39.0–52.0)
Hemoglobin: 10.8 g/dL — ABNORMAL LOW (ref 13.0–17.0)
Lymphocytes Relative: 12 %
Lymphs Abs: 1.4 10*3/uL (ref 0.7–4.0)
MCH: 30.4 pg (ref 26.0–34.0)
MCHC: 33.1 g/dL (ref 30.0–36.0)
MCV: 91.8 fL (ref 78.0–100.0)
MONO ABS: 1.6 10*3/uL — AB (ref 0.1–1.0)
Monocytes Relative: 14 %
Neutro Abs: 8.4 10*3/uL — ABNORMAL HIGH (ref 1.7–7.7)
Neutrophils Relative %: 71 %
PLATELETS: 165 10*3/uL (ref 150–400)
RBC: 3.55 MIL/uL — ABNORMAL LOW (ref 4.22–5.81)
RDW: 13.6 % (ref 11.5–15.5)
WBC: 11.8 10*3/uL — ABNORMAL HIGH (ref 4.0–10.5)

## 2016-07-01 LAB — STREP PNEUMONIAE URINARY ANTIGEN: STREP PNEUMO URINARY ANTIGEN: NEGATIVE

## 2016-07-01 MED ORDER — LORAZEPAM 2 MG/ML PO CONC: 1.0000 mg | ORAL | Status: DC | PRN

## 2016-07-01 MED ORDER — MORPHINE SULFATE 20 MG/5ML PO SOLN: 5.0000 mg | ORAL | 0 refills | Status: DC | PRN

## 2016-07-01 MED ORDER — ALBUTEROL SULFATE (2.5 MG/3ML) 0.083% IN NEBU: 2.5000 mg | INHALATION_SOLUTION | RESPIRATORY_TRACT | 12 refills | Status: DC | PRN

## 2016-07-01 MED ORDER — ENOXAPARIN SODIUM 40 MG/0.4ML ~~LOC~~ SOLN
40.0000 mg | SUBCUTANEOUS | Status: DC
  Administered 2016-07-01: 40 mg via SUBCUTANEOUS
  Filled 2016-07-01: qty 0.4

## 2016-07-01 MED ORDER — LORAZEPAM 2 MG/ML PO CONC: 2.0000 mg | ORAL | 0 refills | Status: DC | PRN

## 2016-07-01 MED ORDER — MORPHINE SULFATE 10 MG/5ML PO SOLN
2.5000 mg | ORAL | Status: DC | PRN
  Administered 2016-07-01 – 2016-07-02 (×2): 2.5 mg via ORAL
  Filled 2016-07-01 (×2): qty 5

## 2016-07-01 MED ORDER — LORAZEPAM 2 MG/ML PO CONC: 1.0000 mg | ORAL | 0 refills | Status: DC | PRN

## 2016-07-01 MED ORDER — MORPHINE SULFATE 10 MG/5ML PO SOLN: 2.5000 mg | ORAL | 0 refills | Status: DC | PRN

## 2016-07-01 MED ORDER — TAMSULOSIN HCL 0.4 MG PO CAPS
0.4000 mg | ORAL_CAPSULE | Freq: Every day | ORAL | Status: DC
  Administered 2016-07-01: 0.4 mg via ORAL
  Filled 2016-07-01: qty 1

## 2016-07-01 MED ORDER — AMOXICILLIN-POT CLAVULANATE 875-125 MG PO TABS: 1.0000 | ORAL_TABLET | Freq: Two times a day (BID) | ORAL | Status: AC

## 2016-07-01 NOTE — Discharge Summary (Addendum)
Joel [name], is a 82 y.o. male  DOB 09/22/1921  MRN [id number].  Admission date:  06/30/2016  Admitting Physician  Karmen Bongo, MD  Discharge Date:  07/01/2016   Primary MD  Jeanmarie Hubert, MD  Recommendations for primary care physician for things to follow:  - Patient to be seen and followed by hospice service at facility. - Goals of care is mainly comfort , mainly for comfort, on when necessary Ativan and morphine for pain and dyspnea    Admission Diagnosis  Abdominal pain [R10.9]   Discharge Diagnosis  Abdominal pain [R10.9]    Principal Problem:   CAP (community acquired pneumonia) Active Problems:   BPH (benign prostatic hyperplasia)   Memory loss   Dysphagia, pharyngoesophageal phase   Goals of care, counseling/discussion      Past Medical History:  Diagnosis Date  . Colon tumor 1985  . Elevated prostate specific antigen (PSA) 05/13/2011  . Herpes zoster 05/13/2011  . Herpes zoster with ophthalmic complication 11/01/6331  . Hypertrophy of prostate without urinary obstruction and other lower urinary tract symptoms (LUTS) 09/20/2010  . Memory loss 03/25/2009  . 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 Nodular prostate without urinary obstruction 05/18/2012  . Osteoarthrosis, unspecified whether generalized or localized, unspecified site 09/20/2010  . Other and unspecified hyperlipidemia 09/20/2010  . Personal history of colonic polyps 09/20/2010  . Unspecified constipation 09/24/2010  . Unspecified glaucoma(365.9) 09/20/2010   bilateral    Past Surgical History:  Procedure Laterality Date  . APPENDECTOMY  1940  . CATARACT EXTRACTION W/ INTRAOCULAR LENS IMPLANT Right 2010   Dr. Ellie Lunch  . COLECTOMY  07/17/1997   sigmoid submucosal lipoma Dr. Oletta Lamas  . COLONOSCOPY  2000   normal  . COLONOSCOPY  04/21/2002   polypectomy  . COLONOSCOPY  06/10/2004   no polyps  .  SKIN CANCER EXCISION Right 05/19/2013   ear Dr. Dannette Barbara  . TONSILLECTOMY  1938  . TUMOR REMOVAL  1993   Removal of Benign Intestinal Tumor   . VASECTOMY  1972       History of present illness and  Hospital Course:     Kindly see H&P for history of present illness and admission details, please review complete Labs, Consult reports and Test reports for all details in brief  HPI  from the history and physical done on the day of admission 06/30/2016  HPI: Joel [name] is a 82 y.o. male with medical history significant of mild-moderate dementia, glaucoma, constipation, and BPH presenting with RUQ pain.  Symptoms started Saturday with severe abdominal pain after dinner, RUQ pain with distention.  Wife thought it was indigestion, gave him Pepcid.  After several hours, he was able to rest.  Didn't happen again until the next night although he was very somnolent the entire next day.  No cough, no SOB.  Slight wheezing.  Simliar episode Sunday night and again today after lunch.  No SOB.  Wheelchair dependent, doesn't do much.  ?aspiration.  Had a very bad choking  episode on Friday night at/after dinner.  Choking episode for a few minutes, and then coughing for another 10-15 minutes afterward.  Ongoing confusion, gradually worsening - but no different now than at current baseline.  Colicky RUQ pain, groaned for about 90 minutes.  Even some pain with palpating RUQ.    His family has already been thinking he may be ready to transition to skilled nursing and they have been in talks with the facility where they live about this.  Wife will remain in their apartment.  The entire family is also very certain that the patient is nearing the end of his life, and they are at peace with this.  Their utmost concern is that he be comfortable at all costs.  They are in agreement with use of antibiotics for treatment of pneumonia and with swallow evaluation and dysphagia diet, but they would prefer comfort feeds  regardless of aspiration risk.  They are also very clear that they would not want to escalate care in any way.  Absolutely no transfer to ICU, no BIPAP/CPAP, no pressors.  If the patient's condition worsens or if he develops recurrent episodes of aspiration PNA, they would want to transition to Hospice.   ED Course: Per Dr. Venora Maples: Presentation concerning for possible right lower lobe pneumonia as the cause of his right upper quadrant abdominal pain as this could be causing some diaphragmatic irritation. Patient be started on Rocephin and azithromycin. Ultrasound of his right upper quadrant shows no abnormalities. He will undergo CT imaging of the right side of his abdomen to further evaluate if there is an intra-abdominal cause to his symptoms.  Hospital Course   CAP  - Patient very likely aspiration pneumonia, treated with Rocephin and azithromycin during hospital stay, discussed with daughter who is a geriatrician NP, and is very familiar with end-of-life issues, and she does understand with patient's progressive memory loss and dementia, and severe deconditioning, he has limited life expectancy, and goal is comfort, so plan is to discharge [facility] to [facility], which hospice will be following with him there, we'll discharge on when necessary morphine and Ativan for dyspnea, and pain, MOST form has been filled, which indicates patient is comfort measures only, and patient not to be transferred back to hospital unless comfort needs cannot be met.   Glaucoma -Continue Cosopt, Xalatan  BPH -Continue Flomax and Proscar   Discharge Condition:  Patient is being discharged to SNF, to be seen by hospice, appears to be comfortable at time of discharge, main goal is comfort .    Follow UP   by hospice at facility   Discharge Instructions  and  Discharge Medications     Discharge Instructions    Discharge instructions    Complete by:  As directed    Patient to be seen by  hospice at facility - Feeding for comfort  Activity: As tolerated with Full fall precautions use walker/cane & assistance as needed   Disposition SNF   Diet: Feeding for comfort, patient is high risk for aspiration, with feeding assistance and aspiration precautions.       Medication List    TAKE these medications   acetaminophen 650 MG CR tablet Commonly known as:  TYLENOL Take 1,300 mg by mouth at bedtime.   albuterol (2.5 MG/3ML) 0.083% nebulizer solution Commonly known as:  PROVENTIL Take 3 mLs (2.5 mg total) by nebulization every 4 (four) hours as needed for wheezing or shortness of breath.   amoxicillin-clavulanate 875-125 MG tablet  Commonly known as:  AUGMENTIN Take 1 tablet by mouth 2 (two) times daily.   CENTRUM tablet Take 1 tablet by mouth daily.   docusate sodium 100 MG capsule Commonly known as:  COLACE Take 100 mg by mouth daily as needed for mild constipation.   dorzolamide-timolol 22.3-6.8 MG/ML ophthalmic solution Commonly known as:  COSOPT Place 1 drop into both eyes 2 (two) times daily.   finasteride 5 MG tablet Commonly known as:  PROSCAR Take one tablet by mouth once daily to shrink the prostate What changed:  how much to take  how to take this  when to take this  additional instructions   latanoprost 0.005 % ophthalmic solution Commonly known as:  XALATAN Place 1 drop into both eyes at bedtime.   LORazepam 2 MG/ML concentrated solution Commonly known as:  ATIVAN Take 0.5 mLs (1 mg total) by mouth every 4 (four) hours as needed for anxiety or sedation (or dyspnea).   morphine 20 MG/5ML solution Take 1.3 mLs (5.2 mg total) by mouth every 2 (two) hours as needed for pain.   polyethylene glycol packet Commonly known as:  MIRALAX / GLYCOLAX Take 17 g by mouth daily as needed for moderate constipation.   tamsulosin 0.4 MG Caps capsule Commonly known as:  FLOMAX TAKE ONE CAPSULE BY MOUTH ONCE DAILY TO HELP PROSTATE AND TO REDUCE  URINARY FREQUENCY What changed:  See the new instructions.         Diet and Activity recommendation: See Discharge Instructions above   Consults obtained -  None   Major procedures and Radiology Reports - PLEASE review detailed and final reports for all details, in brief -      Dg Chest 2 View  Result Date: 06/30/2016 CLINICAL DATA:  Right upper quadrant pain and tachypnea. EXAM: CHEST  2 VIEW COMPARISON:  None. FINDINGS: Shallow lung inflation. Mild right parahilar airspace opacities. No pneumothorax or sizable pleural effusion. No pulmonary edema. Atherosclerotic calcification within the aortic arch. IMPRESSION: 1. Right parahilar airspace opacities, possibly exaggerated by shallow lung inflation, may represent central pulmonary vascular congestion. 2. Aortic atherosclerosis. Electronically Signed   By: Ulyses Jarred M.D.   On: 06/30/2016 17:25   Ct Abdomen Pelvis W Contrast  Result Date: 06/30/2016 CLINICAL DATA:  Right upper quadrant abdominal pain for 3 days. EXAM: CT ABDOMEN AND PELVIS WITH CONTRAST TECHNIQUE: Multidetector CT imaging of the abdomen and pelvis was performed using the standard protocol following bolus administration of intravenous contrast. CONTRAST:  176m ISOVUE-300 IOPAMIDOL (ISOVUE-300) INJECTION 61% COMPARISON:  MRI of the lumbar spine from 12/30/2015, FINDINGS: Lower chest: Patchy airspace disease in the right lower lobe with small pleural effusion. Left lower lobe atelectasis. Top normal-sized cardiac chambers without pericardial effusion. Coronary arteriosclerosis noted. Hepatobiliary: No focal liver abnormality is seen. No gallstones, gallbladder wall thickening, or biliary dilatation. Pancreas: Unremarkable. No pancreatic ductal dilatation or surrounding inflammatory changes. Spleen: Normal in size without focal abnormality. Adrenals/Urinary Tract: Adrenal glands are unremarkable. Variable sized renal cysts are seen, the largest is on the left measuring 4.7 x  4.7 x 4.3 cm in the mid to upper pole. Several small exophytic cysts are noted, one anteriorly and two posterolaterally on the right measuring up to 2 cm. There is bladder diverticulosis most of which are shallow and broad-based in appearance. There is mild thickening of the bladder mucosa superiorly up to 7-8 mm. No bladder calculi. Stomach/Bowel: Debris filled duodenum diverticulum measuring 3.9 cm in diameter. Small lipoma noted in the distal ileum,  series 3, image 60 and series 4, image 71 measuring up to 1.1 cm in diameter. Bilateral inguinal hernias containing small and large bowel on the right and large bowel in the left. No bowel obstruction. No acute inflammation. Vascular/Lymphatic: Atherosclerotic appearance of the abdominal aorta without aneurysm. Ectatic appearance of the common iliac arteries. No focal aneurysm. Reproductive: Enlarged prostate measuring up to 5.6 cm AP x 6.3 cm transverse x 6.3 cm craniocaudad. Other: No free air nor free fluid. Musculoskeletal: Chronic T11, L1, L2 and L5 compression fractures. Degenerative vacuum disc phenomenon from L2 through L5. Slight chronic minimal retrolisthesis of L2 on L3. IMPRESSION: 1. Patchy airspace disease at the right lung base with small pleural effusion. Right lower lobe pneumonia with parapneumonic effusion might have this appearance. This may contribute to right upper quadrant symptoms. 2. Duodenum diverticulum measuring 3.9 cm containing internal debris off the second portion. 3. Enlarged prostate with mildly distended and thick-walled bladder which may reflect chronic cystitis. There is bladder diverticulosis. 4. Bilateral renal cysts. 5. Chronic compression fractures of T11, L1, L2 and L5. Electronically Signed   By: Ashley Royalty M.D.   On: 06/30/2016 18:48   US Abdomen Limited Ruq  Result Date: 06/30/2016 CLINICAL DATA:  Right upper quadrant pain. EXAM: US ABDOMEN LIMITED - RIGHT UPPER QUADRANT COMPARISON:  MRI 12/30/2015. FINDINGS:  Gallbladder: No gallstones or wall thickening visualized. No sonographic Murphy sign noted by sonographer. Common bile duct: Diameter: 5 mm Liver: No focal lesion identified. Within normal limits in parenchymal echogenicity. Incidental note is made of simple renal cysts in the right kidney. Exam limited by overlying bowel gas. IMPRESSION: No acute abnormality noted. Specifically no evidence of gallstones or biliary distention. Limited exam due to bowel gas. Electronically Signed   By: Marcello Moores  Register   On: 06/30/2016 17:16    Micro Results    No results found for this or any previous visit (from the past 240 hour(s)).     Today   Subjective:   Leonia Corona today Appears comfortable, wife at bedside she said he had was moaning at night, but has been comfortable this morning, he denies any complaints    Objective:   Blood pressure (!) 115/54, pulse 74, temperature 98.2 F (36.8 C), temperature source Oral, resp. rate 16, height _0  (1.727 m), weight 79.4 kg (175 lb), SpO2 96 %.   Intake/Output Summary (Last 24 hours) at 07/01/16 1311 Last data filed at 07/01/16 1003  Gross per 24 hour  Intake              300 ml  Output              750 ml  Net             -450 ml    Exam Awake , Pleasant Supple Neck,No JVD, Symmetrical Chest wall movement, Good air movement bilaterally, CTAB RRR,No Gallops,Rubs or new Murmurs, No Parasternal Heave +ve B.Sounds, Abd Soft, Non tender,  No Cyanosis, Clubbing or edema, No new Rash or bruise  Data Review   CBC w Diff:  Lab Results  Component Value Date   WBC 11.8 (H) 07/01/2016   HGB 10.8 (L) 07/01/2016   HCT 32.6 (L) 07/01/2016   PLT 165 07/01/2016   LYMPHOPCT 12 07/01/2016   MONOPCT 14 07/01/2016   EOSPCT 3 07/01/2016   BASOPCT 0 07/01/2016    CMP:  Lab Results  Component Value Date   NA 136 07/01/2016   NA 136 (A) 11/12/2015  K 3.7 07/01/2016   CL 109 07/01/2016   CO2 22 07/01/2016   BUN 17 07/01/2016   BUN 22 (A)  11/12/2015   CREATININE 0.86 07/01/2016   GLU 88 11/12/2015   PROT 6.9 06/30/2016   ALBUMIN 3.6 06/30/2016   BILITOT 1.1 06/30/2016   ALKPHOS 79 06/30/2016   AST 18 06/30/2016   ALT 12 (L) 06/30/2016  .   Total Time in preparing paper work, data evaluation and todays exam - 35 minutes  Nyasiah Moffet M.D on 07/01/2016 at 1:11 PM  [facility]   Office  [phone]

## 2016-07-01 NOTE — Evaluation (Signed)
SLP Cancellation Note  Patient Details Name: BLADYN ELKIND MRN: [id number] DOB: [birth year]   Cancelled treatment:       Reason Eval/Treat Not Completed: Other (comment) (MD cancelled order due to pt's chronic dysphagia)   Luanna Salk, [facility] SLP [phone]

## 2016-07-01 NOTE — Clinical Social Work Note (Signed)
CSW awaiting [place] that went to nurse review.  Patient cannot discharge to SNF without this number.  CSW will continue monitoring for further information needed or passar number.  Dede Query, LCSW [place] Worker - Weekend Coverage cell #: [phone]

## 2016-07-01 NOTE — Progress Notes (Signed)
RN paged due to lost IV access on pt. He has already had his IV antibiotics today (at 6pm) and will likely leave in am for SNF. He is eating and drinking well per RN. Will leave out but if held tomorrow, may need IV restarted.  KJKG, NP [facility]

## 2016-07-01 NOTE — Discharge Instructions (Signed)
Patient to be seen by hospice at facility - Feeding for comfort  Activity: As tolerated with Full fall precautions use walker/cane & assistance as needed   Disposition SNF   Diet: Feeding for comfort, patient is high risk for aspiration, with feeding assistance and aspiration precautions.

## 2016-07-01 NOTE — NC FL2 (Signed)
[place] LEVEL OF CARE SCREENING TOOL     IDENTIFICATION  Patient Name: Joel [name] Birthdate: 14-Sep-1921 Sex: male Admission Date (Current Location): 06/30/2016  [facility] and [place] Number:  Herbalist and Address:  [facility],  [place] [address], [place]      Provider Number: [id number]  Attending Physician Name and Address:  Albertine Patricia, MD  Relative Name and Phone Number:       Current Level of Care: Hospital Recommended Level of Care: [place] Prior Approval Number:    Date Approved/Denied:   PASRR Number:    Discharge Plan: SNF    Current Diagnoses: Patient Active Problem List   Diagnosis Date Noted  . CAP (community acquired pneumonia) 06/30/2016  . Goals of care, counseling/discussion 06/30/2016  . Weakness of right leg 02/05/2016  . 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 Fall 02/20/2015  . Lumbosacral pain 02/20/2015  . Pain of left leg 12/15/2014  . Pain in left shoulder 11/21/2014  . Dysphagia, pharyngoesophageal phase 02/14/2014  . Dizzy 09/27/2013  . Arrhythmia, sinus node ([place]) 09/27/2013  . 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 BPH (benign prostatic hyperplasia) 04/12/2013  . Elevated prostate specific antigen (PSA) 05/13/2011  . Dyslipidemia 09/20/2010  . Osteoarthrosis, unspecified whether generalized or localized, unspecified site 09/20/2010  . Memory loss 03/25/2009    Orientation RESPIRATION BLADDER Height & Weight     Self  Normal Incontinent Weight: 175 lb (79.4 kg) Height:  5\' 8"  (172.7 cm)  BEHAVIORAL SYMPTOMS/MOOD NEUROLOGICAL BOWEL NUTRITION STATUS      Continent Diet (soft fluid consistency nectar thick)  AMBULATORY STATUS COMMUNICATION OF NEEDS Skin   Extensive Assist Verbally Normal                       Personal Care Assistance Level of Assistance  Total care           Functional Limitations Info  Speech      Speech Info: Impaired    SPECIAL CARE FACTORS FREQUENCY                       Contractures      Additional Factors Info  Code Status Code Status Info: DNR             Current Medications (07/01/2016):  This is the current hospital active medication list Current Facility-Administered Medications  Medication Dose Route Frequency Provider Last Rate Last Dose  . acetaminophen (TYLENOL) tablet 650 mg  650 mg Oral Q6H PRN Karmen Bongo, MD      . albuterol (PROVENTIL) (2.5 MG/3ML) 0.083% nebulizer solution 2.5 mg  2.5 mg Nebulization Q4H PRN Karmen Bongo, MD      . azithromycin (ZITHROMAX) 500 mg in dextrose 5 % 250 mL IVPB  500 mg Intravenous Q24H Karmen Bongo, MD      . cefTRIAXone (ROCEPHIN) 1 g in dextrose 5 % 50 mL IVPB  1 g Intravenous Q24H Luiz Ochoa, RPH      . docusate sodium (COLACE) capsule 100 mg  100 mg Oral Daily PRN Karmen Bongo, MD      . dorzolamide (TRUSOPT) 2 % ophthalmic solution 1 drop  1 drop Both Eyes BID Karmen Bongo, MD   1 drop at 07/01/16 1313   And  . timolol (TIMOPTIC) 0.5 % ophthalmic solution 1 drop  1 drop Both Eyes BID  Karmen Bongo, MD   1 drop at 07/01/16 0014  . enoxaparin (LOVENOX) injection 40 mg  40 mg Subcutaneous Q24H Minda Ditto, RPH      . finasteride (PROSCAR) tablet 5 mg  5 mg Oral Daily Karmen Bongo, MD   5 mg at 07/01/16 1009  . lactated ringers infusion   Intravenous Continuous Karmen Bongo, MD 50 mL/hr at 07/01/16 0012    . latanoprost (XALATAN) 0.005 % ophthalmic solution 1 drop  1 drop Both Eyes QHS Karmen Bongo, MD   1 drop at 07/01/16 0010  . LORazepam (ATIVAN) 2 MG/ML concentrated solution 1 mg  1 mg Oral Q4H PRN Albertine Patricia, MD      . morphine 10 MG/5ML solution 2.5 mg  2.5 mg Oral Q2H PRN Albertine Patricia, MD      . morphine 2 MG/ML injection 2 mg  2 mg Intravenous Q4H PRN Karmen Bongo, MD      . ondansetron [facility]) injection 4 mg  4 mg Intravenous Q6H PRN Karmen Bongo, MD      .  polyethylene glycol (MIRALAX / GLYCOLAX) packet 17 g  17 g Oral Daily PRN Karmen Bongo, MD      . tamsulosin (FLOMAX) capsule 0.4 mg  0.4 mg Oral QHS Albertine Patricia, MD         Discharge Medications: Please see discharge summary for a list of discharge medications.  Relevant Imaging Results:  Relevant Lab Results:   Additional Information [place], [place]

## 2016-07-02 DIAGNOSIS — J189 Pneumonia, unspecified organism: Secondary | ICD-10-CM | POA: Diagnosis not present

## 2016-07-02 DIAGNOSIS — N401 Enlarged prostate with lower urinary tract symptoms: Secondary | ICD-10-CM | POA: Diagnosis not present

## 2016-07-02 DIAGNOSIS — J181 Lobar pneumonia, unspecified organism: Secondary | ICD-10-CM | POA: Diagnosis not present

## 2016-07-02 NOTE — Clinical Social Work Placement (Signed)
   CLINICAL SOCIAL WORK PLACEMENT  NOTE  Date:  07/02/2016  Patient Details  Name: Joel [name] MRN: [id number] Date of Birth: 03-Sep-1921  Clinical Social Work is seeking post-discharge placement for this patient at the [place] level of care (*CSW will initial, date and re-position this form in  chart as items are completed):  Yes   Patient/family provided with [place] Work Department's list of facilities offering this level of care within the geographic area requested by the patient (or if unable, by the patient's family).  Yes   Patient/family informed of their freedom to choose among providers that offer the needed level of care, that participate in Medicare, Medicaid or managed care program needed by the patient, have an available bed and are willing to accept the patient.  Yes   Patient/family informed of [facility]'s ownership interest in [facility] and [facility], as well as of the fact that they are under no obligation to receive care at these facilities.  PASRR submitted to EDS on 07/01/16     PASRR number received on 07/02/16     Existing PASRR number confirmed on       FL2 transmitted to all facilities in geographic area requested by pt/family on 07/01/16     FL2 transmitted to all facilities within larger geographic area on       Patient informed that his/her managed care company has contracts with or will negotiate with certain facilities, including the following:        Yes   Patient/family informed of bed offers received.  [facility] bed at  [facility])     Physician recommends and patient chooses bed at      Patient to be transferred to  ([facility]) on 07/02/16.  Patient to be transferred to facility by ambulance     Patient family notified on 07/02/16 of transfer.  Name of family member notified:   (wife Pamala Hurry and daughter lynn)     PHYSICIAN       Additional Comment:     _______________________________________________ Carlean Jews, LCSW 07/02/2016, 1:45 PM

## 2016-07-02 NOTE — Clinical Social Work Note (Signed)
CSW received pasaar number and patient can be discharged to SNF today.  CSW will provide information in rounds this morning to MD.  .Dede Query, [facility] Clinical Social Worker [phone]

## 2016-07-02 NOTE — Progress Notes (Signed)
PROGRESS NOTE    Joel [name]  [id number] DOB: 1921-08-30 DOA: 06/30/2016 PCP: Jeanmarie Hubert, MD   Assessment & Plan:   Principal Problem:   CAP (community acquired pneumonia) Active Problems:   BPH (benign prostatic hyperplasia)   Memory loss   Dysphagia, pharyngoesophageal phase   Goals of care, counseling/discussion  #1 community-acquired pneumonia/likely aspiration pneumonia Noted on CT abdomen and pelvis. Patient likely with aspiration pneumonia was placed empirically on IV Rocephin and azithromycin. Attending physician during patient's admission discussed with patient's daughter who is a geriatrician NP who was very familiar with end-of-life issues and understands patient's progressive memory loss and dementia and severe different conditioning with limited life expectancy and go was full comfort. Patient improved clinically remained afebrile during the hospitalization. Most form was filled out. Patient was also placed on morphine and Ativan as needed for dyspnea and pain. Family had wanted comfort measures only and a such patient be discharged back to the skilled nursing facility with hospice following. Patient be discharged on Augmentin to complete a course of antibiotic treatment.  #2 glaucoma Patient was maintained on home regimen of Cosopt and Dilantin eyedrops.  #3 BPH Patient was maintained on home regimen of Flomax and Proscar.  The rest of patient's chronic medical issues remained stable throughout the hospitalization.   DVT prophylaxis: Lovenox Code Status: DO NOT RESUSCITATE Family Communication: Updated family at bedside. Disposition Plan: Skilled nursing facility with hospice following.   Consultants:   None  Procedures:   Right upper quadrant ultrasound 06/30/2016  Chest x-ray 06/30/2016  CT abdomen and pelvis 06/30/2016  Antimicrobials:    IV azithromycin 06/30/2016  IV Rocephin 06/30/2016   Subjective: Patient with no complaints. No  chest pain. No shortness of breath. Resting comfortably.  Objective: Vitals:   07/01/16 0646 07/01/16 1509 07/01/16 2111 07/02/16 0454  BP: (!) 115/54 (!) 144/80 123/65 131/64  Pulse: 74 99 74 84  Resp: 16 16 16 16   Temp: 98.2 F (36.8 C) 98 F (36.7 C) 98.7 F (37.1 C) 98.6 F (37 C)  TempSrc: Oral Oral Oral Oral  SpO2: 96% 99% 97% 96%  Weight:      Height:        Intake/Output Summary (Last 24 hours) at 07/02/16 1053 Last data filed at 07/01/16 1503  Gross per 24 hour  Intake              250 ml  Output                0 ml  Net              250 ml   Filed Weights   06/30/16 1357  Weight: 79.4 kg (175 lb)    Examination:  General exam: Appears calm and comfortable. Respiratory system: Clear to auscultation. Respiratory effort normal. Cardiovascular system: S1 & S2 heard, RRR. No JVD, murmurs, rubs, gallops or clicks. No pedal edema. Gastrointestinal system: Abdomen is nondistended, soft and nontender. No organomegaly or masses felt. Normal bowel sounds heard. Central nervous system: Alert and oriented. No focal neurological deficits. Extremities: Symmetric 5 x 5 power. Skin: No rashes, lesions or ulcers Psychiatry: Judgement and insight appear normal. Mood & affect appropriate.     Data Reviewed: I have personally reviewed following labs and imaging studies  CBC:  Recent Labs Lab 06/30/16 1420 07/01/16 0354  WBC 12.7* 11.8*  NEUTROABS  --  8.4*  HGB 12.5* 10.8*  HCT 37.9* 32.6*  MCV 92.0 91.8  PLT 168  [id number]   Basic Metabolic Panel:  Recent Labs Lab 06/30/16 1420 07/01/16 0354  NA 137 136  K 3.9 3.7  CL 104 109  CO2 26 22  GLUCOSE 107* 92  BUN 22* 17  CREATININE 0.90 0.86  CALCIUM 8.7* 7.9*   GFR: Estimated Creatinine Clearance: 51.9 mL/min (by C-G formula based on SCr of 0.86 mg/dL). Liver Function Tests:  Recent Labs Lab 06/30/16 1420  AST 18  ALT 12*  ALKPHOS 79  BILITOT 1.1  PROT 6.9  ALBUMIN 3.6    Recent Labs Lab  06/30/16 1420  LIPASE 19   No results for input(s): AMMONIA in the last 168 hours. Coagulation Profile: No results for input(s): INR, PROTIME in the last 168 hours. Cardiac Enzymes: No results for input(s): CKTOTAL, CKMB, CKMBINDEX, TROPONINI in the last 168 hours. BNP (last 3 results) No results for input(s): PROBNP in the last 8760 hours. HbA1C: No results for input(s): HGBA1C in the last 72 hours. CBG: No results for input(s): GLUCAP in the last 168 hours. Lipid Profile: No results for input(s): CHOL, HDL, LDLCALC, TRIG, CHOLHDL, LDLDIRECT in the last 72 hours. Thyroid Function Tests: No results for input(s): TSH, T4TOTAL, FREET4, T3FREE, THYROIDAB in the last 72 hours. Anemia Panel: No results for input(s): VITAMINB12, FOLATE, FERRITIN, TIBC, IRON, RETICCTPCT in the last 72 hours. Sepsis Labs: No results for input(s): PROCALCITON, LATICACIDVEN in the last 168 hours.  Recent Results (from the past 240 hour(s))  Culture, blood (routine x 2) Call MD if unable to obtain prior to antibiotics being given     Status: None (Preliminary result)   Collection Time: 06/30/16 10:18 PM  Result Value Ref Range Status   Specimen Description BLOOD LEFT HAND  Final   Special Requests IN PEDIATRIC BOTTLE 2ML  Final   Culture   Final    NO GROWTH < 24 HOURS Performed at [facility]    Report Status PENDING  Incomplete  Culture, blood (routine x 2) Call MD if unable to obtain prior to antibiotics being given     Status: None (Preliminary result)   Collection Time: 06/30/16 10:18 PM  Result Value Ref Range Status   Specimen Description BLOOD LEFT ANTECUBITAL  Final   Special Requests IN PEDIATRIC BOTTLE 2.5ML  Final   Culture   Final    NO GROWTH < 24 HOURS Performed at [facility]    Report Status PENDING  Incomplete         Radiology Studies: Dg Chest 2 View  Result Date: 06/30/2016 CLINICAL DATA:  Right upper quadrant pain and tachypnea. EXAM: CHEST  2 VIEW  COMPARISON:  None. FINDINGS: Shallow lung inflation. Mild right parahilar airspace opacities. No pneumothorax or sizable pleural effusion. No pulmonary edema. Atherosclerotic calcification within the aortic arch. IMPRESSION: 1. Right parahilar airspace opacities, possibly exaggerated by shallow lung inflation, may represent central pulmonary vascular congestion. 2. Aortic atherosclerosis. Electronically Signed   By: Ulyses Jarred M.D.   On: 06/30/2016 17:25   Ct Abdomen Pelvis W Contrast  Result Date: 06/30/2016 CLINICAL DATA:  Right upper quadrant abdominal pain for 3 days. EXAM: CT ABDOMEN AND PELVIS WITH CONTRAST TECHNIQUE: Multidetector CT imaging of the abdomen and pelvis was performed using the standard protocol following bolus administration of intravenous contrast. CONTRAST:  178mL ISOVUE-300 IOPAMIDOL (ISOVUE-300) INJECTION 61% COMPARISON:  MRI of the lumbar spine from 12/30/2015, FINDINGS: Lower chest: Patchy airspace disease in the right lower lobe with small pleural effusion. Left lower lobe atelectasis. Top  normal-sized cardiac chambers without pericardial effusion. Coronary arteriosclerosis noted. Hepatobiliary: No focal liver abnormality is seen. No gallstones, gallbladder wall thickening, or biliary dilatation. Pancreas: Unremarkable. No pancreatic ductal dilatation or surrounding inflammatory changes. Spleen: Normal in size without focal abnormality. Adrenals/Urinary Tract: Adrenal glands are unremarkable. Variable sized renal cysts are seen, the largest is on the left measuring 4.7 x 4.7 x 4.3 cm in the mid to upper pole. Several small exophytic cysts are noted, one anteriorly and two posterolaterally on the right measuring up to 2 cm. There is bladder diverticulosis most of which are shallow and broad-based in appearance. There is mild thickening of the bladder mucosa superiorly up to 7-8 mm. No bladder calculi. Stomach/Bowel: Debris filled duodenum diverticulum measuring 3.9 cm in diameter.  Small lipoma noted in the distal ileum, series 3, image 60 and series 4, image 71 measuring up to 1.1 cm in diameter. Bilateral inguinal hernias containing small and large bowel on the right and large bowel in the left. No bowel obstruction. No acute inflammation. Vascular/Lymphatic: Atherosclerotic appearance of the abdominal aorta without aneurysm. Ectatic appearance of the common iliac arteries. No focal aneurysm. Reproductive: Enlarged prostate measuring up to 5.6 cm AP x 6.3 cm transverse x 6.3 cm craniocaudad. Other: No free air nor free fluid. Musculoskeletal: Chronic T11, L1, L2 and L5 compression fractures. Degenerative vacuum disc phenomenon from L2 through L5. Slight chronic minimal retrolisthesis of L2 on L3. IMPRESSION: 1. Patchy airspace disease at the right lung base with small pleural effusion. Right lower lobe pneumonia with parapneumonic effusion might have this appearance. This may contribute to right upper quadrant symptoms. 2. Duodenum diverticulum measuring 3.9 cm containing internal debris off the second portion. 3. Enlarged prostate with mildly distended and thick-walled bladder which may reflect chronic cystitis. There is bladder diverticulosis. 4. Bilateral renal cysts. 5. Chronic compression fractures of T11, L1, L2 and L5. Electronically Signed   By: Ashley Royalty M.D.   On: 06/30/2016 18:48   US Abdomen Limited Ruq  Result Date: 06/30/2016 CLINICAL DATA:  Right upper quadrant pain. EXAM: US ABDOMEN LIMITED - RIGHT UPPER QUADRANT COMPARISON:  MRI 12/30/2015. FINDINGS: Gallbladder: No gallstones or wall thickening visualized. No sonographic Murphy sign noted by sonographer. Common bile duct: Diameter: 5 mm Liver: No focal lesion identified. Within normal limits in parenchymal echogenicity. Incidental note is made of simple renal cysts in the right kidney. Exam limited by overlying bowel gas. IMPRESSION: No acute abnormality noted. Specifically no evidence of gallstones or biliary  distention. Limited exam due to bowel gas. Electronically Signed   By: [place]   On: 06/30/2016 17:16        Scheduled Meds: . azithromycin  500 mg Intravenous Q24H  . cefTRIAXone (ROCEPHIN)  IV  1 g Intravenous Q24H  . dorzolamide  1 drop Both Eyes BID   And  . timolol  1 drop Both Eyes BID  . enoxaparin (LOVENOX) injection  40 mg Subcutaneous Q24H  . finasteride  5 mg Oral Daily  . latanoprost  1 drop Both Eyes QHS  . tamsulosin  0.4 mg Oral QHS   Continuous Infusions: . lactated ringers 50 mL/hr at 07/01/16 0012     LOS: 2 days    Time spent: 68 minutes    Nelda Luckey, MD [facility] Pager [phone]  If 7PM-7AM, please contact night-coverage [email or web address] Password [email or web address] 07/02/2016, 10:53 AM

## 2016-07-02 NOTE — NC FL2 (Signed)
[place] LEVEL OF CARE SCREENING TOOL     IDENTIFICATION  Patient Name: Joel [name] Birthdate: [birth year] Sex: male Admission Date (Current Location): 06/30/2016  [facility] and [place] Number:  Herbalist and Address:  [facility],  [place] [address], [place]      Provider Number: [id number]  Attending Physician Name and Address:  Eugenie Filler, MD  Relative Name and Phone Number:       Current Level of Care: Hospital Recommended Level of Care: [place] Prior Approval Number:    Date Approved/Denied:   PASRR Number: [id number]   Discharge Plan: SNF    Current Diagnoses: Patient Active Problem List   Diagnosis Date Noted  . CAP (community acquired pneumonia) 06/30/2016  . Goals of care, counseling/discussion 06/30/2016  . Weakness of right leg 02/05/2016  . 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 Fall 02/20/2015  . Lumbosacral pain 02/20/2015  . Pain of left leg 12/15/2014  . Pain in left shoulder 11/21/2014  . Dysphagia, pharyngoesophageal phase 02/14/2014  . Dizzy 09/27/2013  . Arrhythmia, sinus node ([place]) 09/27/2013  . 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 BPH (benign prostatic hyperplasia) 04/12/2013  . Elevated prostate specific antigen (PSA) 05/13/2011  . Dyslipidemia 09/20/2010  . Osteoarthrosis, unspecified whether generalized or localized, unspecified site 09/20/2010  . Memory loss 03/25/2009    Orientation RESPIRATION BLADDER Height & Weight     Self  Normal Incontinent Weight: 175 lb (79.4 kg) Height:  5\' 8"  (172.7 cm)  BEHAVIORAL SYMPTOMS/MOOD NEUROLOGICAL BOWEL NUTRITION STATUS      Continent Diet (soft fluid consistency nectar thick)  AMBULATORY STATUS COMMUNICATION OF NEEDS Skin   Extensive Assist Verbally Normal                       Personal Care Assistance Level of Assistance  Total care           Functional Limitations Info   Speech     Speech Info: Impaired    SPECIAL CARE FACTORS FREQUENCY                       Contractures      Additional Factors Info  Code Status Code Status Info: DNR             Current Medications (07/02/2016):  This is the current hospital active medication list Current Facility-Administered Medications  Medication Dose Route Frequency Provider Last Rate Last Dose  . acetaminophen (TYLENOL) tablet 650 mg  650 mg Oral Q6H PRN Karmen Bongo, MD      . albuterol (PROVENTIL) (2.5 MG/3ML) 0.083% nebulizer solution 2.5 mg  2.5 mg Nebulization Q4H PRN Karmen Bongo, MD      . azithromycin (ZITHROMAX) 500 mg in dextrose 5 % 250 mL IVPB  500 mg Intravenous Q24H Karmen Bongo, MD   500 mg at 07/01/16 1831  . cefTRIAXone (ROCEPHIN) 1 g in dextrose 5 % 50 mL IVPB  1 g Intravenous Q24H Luiz Ochoa, RPH   1 g at 07/01/16 1741  . docusate sodium (COLACE) capsule 100 mg  100 mg Oral Daily PRN Karmen Bongo, MD   100 mg at 07/01/16 2119  . dorzolamide (TRUSOPT) 2 % ophthalmic solution 1 drop  1 drop Both Eyes BID Karmen Bongo, MD   1 drop at 07/01/16 1313   And  . timolol (TIMOPTIC) 0.5 % ophthalmic  solution 1 drop  1 drop Both Eyes BID Karmen Bongo, MD   1 drop at 07/01/16 0014  . enoxaparin (LOVENOX) injection 40 mg  40 mg Subcutaneous Q24H Minda Ditto, RPH   40 mg at 07/01/16 2119  . finasteride (PROSCAR) tablet 5 mg  5 mg Oral Daily Karmen Bongo, MD   5 mg at 07/01/16 1009  . lactated ringers infusion   Intravenous Continuous Karmen Bongo, MD 50 mL/hr at 07/01/16 0012    . latanoprost (XALATAN) 0.005 % ophthalmic solution 1 drop  1 drop Both Eyes QHS Karmen Bongo, MD   1 drop at 07/01/16 2119  . LORazepam (ATIVAN) 2 MG/ML concentrated solution 1 mg  1 mg Oral Q4H PRN Albertine Patricia, MD      . morphine 10 MG/5ML solution 2.5 mg  2.5 mg Oral Q2H PRN Albertine Patricia, MD   2.5 mg at 07/01/16 2143  . morphine 2 MG/ML injection 2 mg  2 mg Intravenous Q4H PRN  Karmen Bongo, MD      . ondansetron [facility]) injection 4 mg  4 mg Intravenous Q6H PRN Karmen Bongo, MD      . polyethylene glycol (MIRALAX / GLYCOLAX) packet 17 g  17 g Oral Daily PRN Karmen Bongo, MD      . tamsulosin [facility]) capsule 0.4 mg  0.4 mg Oral QHS Albertine Patricia, MD   0.4 mg at 07/01/16 2119     Discharge Medications: Please see discharge summary for a list of discharge medications.  Relevant Imaging Results:  Relevant Lab Results:   Additional Information [place], [place]

## 2016-07-02 NOTE — Clinical Social Work Note (Signed)
CSW called [facility] for [facility]  .Dede Query, LCSW [facility] Clinical Social Worker cell #:[phone]

## 2016-07-03 ENCOUNTER — Encounter (HOSPITAL_COMMUNITY): Payer: Self-pay | Admitting: *Deleted

## 2016-07-03 ENCOUNTER — Emergency Department (HOSPITAL_COMMUNITY): Payer: PPO

## 2016-07-03 ENCOUNTER — Emergency Department (HOSPITAL_COMMUNITY)
Admission: EM | Admit: 2016-07-03 | Discharge: 2016-07-03 | Disposition: A | Discharge status: Home / Self Care | Payer: PPO | Attending: Emergency Medicine | Admitting: Emergency Medicine

## 2016-07-03 ENCOUNTER — Non-Acute Institutional Stay (SKILLED_NURSING_FACILITY): Payer: PPO | Admitting: Internal Medicine

## 2016-07-03 ENCOUNTER — Encounter: Payer: Self-pay | Admitting: Internal Medicine

## 2016-07-03 DIAGNOSIS — R259 Unspecified abnormal involuntary movements: Secondary | ICD-10-CM | POA: Diagnosis not present

## 2016-07-03 DIAGNOSIS — Y999 Unspecified external cause status: Secondary | ICD-10-CM | POA: Diagnosis not present

## 2016-07-03 DIAGNOSIS — Z85828 Personal history of other malignant neoplasm of skin: Secondary | ICD-10-CM | POA: Insufficient documentation

## 2016-07-03 DIAGNOSIS — R627 Adult failure to thrive: Secondary | ICD-10-CM

## 2016-07-03 DIAGNOSIS — S065X9A Traumatic subdural hemorrhage with loss of consciousness of unspecified duration, initial encounter: Secondary | ICD-10-CM

## 2016-07-03 DIAGNOSIS — W19XXXA Unspecified fall, initial encounter: Secondary | ICD-10-CM | POA: Diagnosis not present

## 2016-07-03 DIAGNOSIS — Z87891 Personal history of nicotine dependence: Secondary | ICD-10-CM | POA: Diagnosis not present

## 2016-07-03 DIAGNOSIS — Y929 Unspecified place or not applicable: Secondary | ICD-10-CM | POA: Diagnosis not present

## 2016-07-03 DIAGNOSIS — R413 Other amnesia: Secondary | ICD-10-CM

## 2016-07-03 DIAGNOSIS — J181 Lobar pneumonia, unspecified organism: Secondary | ICD-10-CM

## 2016-07-03 DIAGNOSIS — W19XXXD Unspecified fall, subsequent encounter: Secondary | ICD-10-CM

## 2016-07-03 DIAGNOSIS — S0181XA Laceration without foreign body of other part of head, initial encounter: Secondary | ICD-10-CM | POA: Diagnosis not present

## 2016-07-03 DIAGNOSIS — Z8603 Personal history of neoplasm of uncertain behavior: Secondary | ICD-10-CM | POA: Insufficient documentation

## 2016-07-03 DIAGNOSIS — H5710 Ocular pain, unspecified eye: Secondary | ICD-10-CM | POA: Diagnosis not present

## 2016-07-03 DIAGNOSIS — R1314 Dysphagia, pharyngoesophageal phase: Secondary | ICD-10-CM

## 2016-07-03 DIAGNOSIS — R269 Unspecified abnormalities of gait and mobility: Secondary | ICD-10-CM

## 2016-07-03 DIAGNOSIS — S3993XA Unspecified injury of pelvis, initial encounter: Secondary | ICD-10-CM | POA: Diagnosis not present

## 2016-07-03 DIAGNOSIS — T148XXA Other injury of unspecified body region, initial encounter: Secondary | ICD-10-CM | POA: Diagnosis not present

## 2016-07-03 DIAGNOSIS — J189 Pneumonia, unspecified organism: Secondary | ICD-10-CM

## 2016-07-03 DIAGNOSIS — T07XXXA Unspecified multiple injuries, initial encounter: Secondary | ICD-10-CM | POA: Diagnosis not present

## 2016-07-03 DIAGNOSIS — S0191XA Laceration without foreign body of unspecified part of head, initial encounter: Secondary | ICD-10-CM

## 2016-07-03 DIAGNOSIS — Y939 Activity, unspecified: Secondary | ICD-10-CM | POA: Insufficient documentation

## 2016-07-03 DIAGNOSIS — S069X9A Unspecified intracranial injury with loss of consciousness of unspecified duration, initial encounter: Secondary | ICD-10-CM | POA: Diagnosis not present

## 2016-07-03 DIAGNOSIS — S199XXA Unspecified injury of neck, initial encounter: Secondary | ICD-10-CM | POA: Diagnosis not present

## 2016-07-03 DIAGNOSIS — S065X0A Traumatic subdural hemorrhage without loss of consciousness, initial encounter: Secondary | ICD-10-CM | POA: Diagnosis not present

## 2016-07-03 DIAGNOSIS — S0990XA Unspecified injury of head, initial encounter: Secondary | ICD-10-CM | POA: Diagnosis not present

## 2016-07-03 DIAGNOSIS — S065XAA Traumatic subdural hemorrhage with loss of consciousness status unknown, initial encounter: Secondary | ICD-10-CM

## 2016-07-03 LAB — LEGIONELLA PNEUMOPHILA SEROGP 1 UR AG: L. pneumophila Serogp 1 Ur Ag: NEGATIVE

## 2016-07-03 MED ORDER — LIDOCAINE HCL (PF) 1 % IJ SOLN
5.0000 mL | Freq: Once | INTRAMUSCULAR | Status: AC
  Administered 2016-07-03: 5 mL
  Filled 2016-07-03: qty 30

## 2016-07-03 MED ORDER — LIDOCAINE-EPINEPHRINE (PF) 2 %-1:200000 IJ SOLN
10.0000 mL | Freq: Once | INTRAMUSCULAR | Status: AC
  Administered 2016-07-03: 10 mL

## 2016-07-03 MED ORDER — TRIPLE ANTIBIOTIC 5-400-5000 EX OINT: TOPICAL_OINTMENT | Freq: Four times a day (QID) | CUTANEOUS | 0 refills | Status: DC

## 2016-07-03 NOTE — ED Provider Notes (Signed)
[facility] DEPT Provider Note   CSN: [id number] Arrival date & time: 07/03/16  0535     History   Chief Complaint Chief Complaint  Patient presents with  . Fall    HPI TRANQUILINO CIMINO is a 82 y.o. male.  HPI  Pt comes in post fall. Pt had an unwitnessed fall as he tried to get to the bathroom at his SNF. Wife at bedside. PT is not on blood thinners. He is c/o of headache and no pain elsewhere.   Past Medical History:  Diagnosis Date  . Colon tumor 1985  . Elevated prostate specific antigen (PSA) 05/13/2011  . Herpes zoster 05/13/2011  . Herpes zoster with ophthalmic complication [id number]  . Hypertrophy of prostate without urinary obstruction and other lower urinary tract symptoms (LUTS) 09/20/2010  . Memory loss 03/25/2009  . 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 Nodular prostate without urinary obstruction 05/18/2012  . Osteoarthrosis, unspecified whether generalized or localized, unspecified site 09/20/2010  . Other and unspecified hyperlipidemia 09/20/2010  . Personal history of colonic polyps 09/20/2010  . Unspecified constipation 09/24/2010  . Unspecified glaucoma(365.9) 09/20/2010   bilateral    Patient Active Problem List   Diagnosis Date Noted  . CAP (community acquired pneumonia) 06/30/2016  . Goals of care, counseling/discussion 06/30/2016  . Weakness of right leg 02/05/2016  . 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 Fall 02/20/2015  . Lumbosacral pain 02/20/2015  . Pain of left leg 12/15/2014  . Pain in left shoulder 11/21/2014  . Dysphagia, pharyngoesophageal phase 02/14/2014  . Dizzy 09/27/2013  . Arrhythmia, sinus node ([place]) 09/27/2013  . 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 BPH (benign prostatic hyperplasia) 04/12/2013  . Elevated prostate specific antigen (PSA) 05/13/2011  . Dyslipidemia 09/20/2010  . Osteoarthrosis, unspecified whether generalized or localized, unspecified site 09/20/2010  .  Memory loss 03/25/2009    Past Surgical History:  Procedure Laterality Date  . APPENDECTOMY  1940  . CATARACT EXTRACTION W/ INTRAOCULAR LENS IMPLANT Right 2010   Dr. Ellie Lunch  . COLECTOMY  07/17/1997   sigmoid submucosal lipoma Dr. Oletta Lamas  . COLONOSCOPY  2000   normal  . COLONOSCOPY  04/21/2002   polypectomy  . COLONOSCOPY  06/10/2004   no polyps  . SKIN CANCER EXCISION Right 05/19/2013   ear Dr. Dannette Barbara  . TONSILLECTOMY  1938  . TUMOR REMOVAL  1993   Removal of Benign Intestinal Tumor   . [place] Medications    Prior to Admission medications   Medication Sig Start Date End Date Taking? Authorizing Provider  acetaminophen (TYLENOL) 650 MG CR tablet Take 1,300 mg by mouth at bedtime.    Historical Provider, MD  albuterol (PROVENTIL) (2.5 MG/3ML) 0.083% nebulizer solution Take 3 mLs (2.5 mg total) by nebulization every 4 (four) hours as needed for wheezing or shortness of breath. 07/01/16   Silver Huguenin Elgergawy, MD  amoxicillin-clavulanate (AUGMENTIN) 875-125 MG tablet Take 1 tablet by mouth 2 (two) times daily. 07/01/16 07/06/16  Silver Huguenin Elgergawy, MD  docusate sodium (COLACE) 100 MG capsule Take 100 mg by mouth daily as needed for mild constipation.     Historical Provider, MD  dorzolamide-timolol (COSOPT) 22.3-6.8 MG/ML ophthalmic solution Place 1 drop into both eyes 2 (two) times daily. 05/21/16   Historical Provider, MD  finasteride (PROSCAR) 5 MG tablet Take one tablet by mouth once daily to shrink the prostate Patient taking differently:  Take 5 mg by mouth daily.  12/10/15   [name], MD  latanoprost (XALATAN) 0.005 % ophthalmic solution Place 1 drop into both eyes at bedtime.  03/16/13   Historical Provider, MD  LORazepam (ATIVAN) 2 MG/ML concentrated solution Take 1 mL (2 mg total) by mouth every 4 (four) hours as needed for anxiety or sedation (or dyspnea). 07/01/16   Albertine Patricia, MD  morphine 20 MG/5ML solution Take 1.3 mLs (5.2 mg total) by mouth  every 2 (two) hours as needed for pain. 07/01/16   Silver Huguenin Elgergawy, MD  Multiple Vitamins-Minerals (CENTRUM) tablet Take 1 tablet by mouth daily.    Historical Provider, MD  neomycin-bacitracin-polymyxin (NEOSPORIN) [phone] ointment Apply topically 4 (four) times daily. 07/03/16   Varney Biles, MD  polyethylene glycol (MIRALAX / GLYCOLAX) packet Take 17 g by mouth daily as needed for moderate constipation.    Historical Provider, MD  tamsulosin (FLOMAX) 0.4 MG CAPS capsule TAKE ONE CAPSULE BY MOUTH ONCE DAILY TO HELP PROSTATE AND TO REDUCE URINARY FREQUENCY Patient taking differently: TAKE 0.4mg  CAPSULE BY MOUTH ONCE DAILY 05/12/16   [name], MD    Family History Family History  Problem Relation Age of Onset  . Heart disease Father     Social History Social History  Substance Use Topics  . Smoking status: Former Smoker    Quit date: 04/12/1974  . Smokeless tobacco: Never Used  . Alcohol use No     Comment: none in several years     Allergies   Review of patient's allergies indicates no known allergies.   Review of Systems Review of Systems  ROS 10 Systems reviewed and are negative for acute change except as noted in the HPI.     Physical Exam Updated Vital Signs BP 159/96 (BP Location: Left Arm)   Pulse 75   Temp 98.3 F (36.8 C) (Oral)   Resp 18   Wt 170 lb (77.1 kg)   SpO2 97%   BMI 25.85 kg/m   Physical Exam  Constitutional: He appears well-developed.  HENT:  3 cm laceration to the Rt forehead around the eye lids.  Neck: Neck supple.  Cardiovascular: Normal rate.   Pulmonary/Chest: Effort normal.  Musculoskeletal:  Head to toe evaluation shows no hematoma, bleeding of the scalp, no facial abrasions, step offs, crepitus, no tenderness to palpation of the bilateral upper and lower extremities, no gross deformities, no chest tenderness, no pelvic pain.   Neurological: He is alert.  Skin: Skin is warm.  Diffuse skin tear over the RUE  Nursing  note and vitals reviewed.    ED Treatments / Results  Labs (all labs ordered are listed, but only abnormal results are displayed) Labs Reviewed - No data to display  EKG  EKG Interpretation None       Radiology Dg Pelvis 1-2 Views  Result Date: 07/03/2016 CLINICAL DATA:  Unwitnessed fall at nursing facility today. EXAM: PELVIS - 1-2 VIEW COMPARISON:  CT abdomen and pelvis June 30, 2016 FINDINGS: There is no evidence of pelvic fracture or diastasis. No pelvic bone lesions are seen. Osteopenia. Sacroiliac ankylosis. Gas projecting over LEFT hip corresponds to colonic gas within inguinal hernia as seen on recent CT. Mild vascular calcifications. IMPRESSION: Negative. If there is high clinical suspicion for occult hip fracture or the patient refuses to bear weight, consider further evaluation with MRI. Although CT is expeditious, evidence is lacking regarding accuracy of CT over plain film radiography. Electronically Signed   By:  Elon Alas M.D.   On: 07/03/2016 06:38   Ct Head Wo Contrast  Result Date: 07/03/2016 CLINICAL DATA:  Unwitnessed fall. Unknown loss of consciousness. History of memory loss, prostate cancer. EXAM: CT HEAD WITHOUT CONTRAST CT CERVICAL SPINE WITHOUT CONTRAST TECHNIQUE: Multidetector CT imaging of the head and cervical spine was performed following the standard protocol without intravenous contrast. Multiplanar CT image reconstructions of the cervical spine were also generated. COMPARISON:  None. FINDINGS: CT HEAD FINDINGS BRAIN: The ventricles and sulci are normal for age. No intraparenchymal hemorrhage, mass effect nor midline shift. Patchy to confluent supratentorial white matter hypodensities. Old RIGHT caudate head lacunar infarct. No acute large vascular territory infarcts. 2 mm dense LEFT frontal subdural hematoma. Basal cisterns are patent VASCULAR: Moderate calcific atherosclerosis of the carotid siphons. SKULL: No skull fracture. Small RIGHT frontal scalp  hematoma without subcutaneous gas or radiopaque foreign bodies. SINUSES/ORBITS: The mastoid air-cells and included paranasal sinuses are well-aerated.The included ocular globes and orbital contents are non-suspicious. OTHER: None. CT CERVICAL SPINE FINDINGS ALIGNMENT: Maintained cervical lordosis. Grade 1 C5-6 anterolisthesis. Grade 1 C7-T1 anterolisthesis. SKULL BASE AND VERTEBRAE: Cervical vertebral bodies and posterior elements are intact. Moderate to severe C6-7 disc height loss, moderate at C7-T1 with endplate sclerosis and marginal spurring compatible with degenerative discs, mild at C3-4 and C4-5. Severe cervical facet arthropathy. C1-2 articulation maintained with severe arthropathy. Osteopenia without destructive bony lesions. SOFT TISSUES AND SPINAL CANAL: Soft tissues are nonacute. Moderate calcific atherosclerosis of the carotid bulbs. DISC LEVELS: No significant osseous canal stenosis. Severe LEFT C3-4, severe RIGHT greater than LEFT C4-5, moderate C5-6 and moderate C6-7 neural foraminal narrowing. UPPER CHEST: Small RIGHT pleural effusion. OTHER: None. IMPRESSION: CT HEAD: 2 mm acute LEFT frontal subdural hematoma without mass effect. Involutional changes. Moderate to severe chronic small vessel ischemic disease. Small RIGHT frontal scalp hematoma.  No skull fracture. CT CERVICAL SPINE: No acute fracture. C5-6 and C7-T1 anterolisthesis on degenerative basis. Small RIGHT pleural effusion.  Consider dedicated chest radiograph. Acute findings discussed with and reconfirmed by [facility] Carrington Mullenax on 07/03/2016 at 6:51 am. Electronically Signed   By: Elon Alas M.D.   On: 07/03/2016 06:51   Ct Cervical Spine Wo Contrast  Result Date: 07/03/2016 CLINICAL DATA:  Unwitnessed fall. Unknown loss of consciousness. History of memory loss, prostate cancer. EXAM: CT HEAD WITHOUT CONTRAST CT CERVICAL SPINE WITHOUT CONTRAST TECHNIQUE: Multidetector CT imaging of the head and cervical spine was performed  following the standard protocol without intravenous contrast. Multiplanar CT image reconstructions of the cervical spine were also generated. COMPARISON:  None. FINDINGS: CT HEAD FINDINGS BRAIN: The ventricles and sulci are normal for age. No intraparenchymal hemorrhage, mass effect nor midline shift. Patchy to confluent supratentorial white matter hypodensities. Old RIGHT caudate head lacunar infarct. No acute large vascular territory infarcts. 2 mm dense LEFT frontal subdural hematoma. Basal cisterns are patent VASCULAR: Moderate calcific atherosclerosis of the carotid siphons. SKULL: No skull fracture. Small RIGHT frontal scalp hematoma without subcutaneous gas or radiopaque foreign bodies. SINUSES/ORBITS: The mastoid air-cells and included paranasal sinuses are well-aerated.The included ocular globes and orbital contents are non-suspicious. OTHER: None. CT CERVICAL SPINE FINDINGS ALIGNMENT: Maintained cervical lordosis. Grade 1 C5-6 anterolisthesis. Grade 1 C7-T1 anterolisthesis. SKULL BASE AND VERTEBRAE: Cervical vertebral bodies and posterior elements are intact. Moderate to severe C6-7 disc height loss, moderate at C7-T1 with endplate sclerosis and marginal spurring compatible with degenerative discs, mild at C3-4 and C4-5. Severe cervical facet arthropathy. C1-2 articulation maintained with severe arthropathy. Osteopenia  without destructive bony lesions. SOFT TISSUES AND SPINAL CANAL: Soft tissues are nonacute. Moderate calcific atherosclerosis of the carotid bulbs. DISC LEVELS: No significant osseous canal stenosis. Severe LEFT C3-4, severe RIGHT greater than LEFT C4-5, moderate C5-6 and moderate C6-7 neural foraminal narrowing. UPPER CHEST: Small RIGHT pleural effusion. OTHER: None. IMPRESSION: CT HEAD: 2 mm acute LEFT frontal subdural hematoma without mass effect. Involutional changes. Moderate to severe chronic small vessel ischemic disease. Small RIGHT frontal scalp hematoma.  No skull fracture. CT  CERVICAL SPINE: No acute fracture. C5-6 and C7-T1 anterolisthesis on degenerative basis. Small RIGHT pleural effusion.  Consider dedicated chest radiograph. Acute findings discussed with and reconfirmed by [facility] Amaka Gluth on 07/03/2016 at 6:51 am. Electronically Signed   By: Elon Alas M.D.   On: 07/03/2016 06:51    Procedures .Marland KitchenLaceration Repair Date/Time: 07/03/2016 8:05 AM Performed by: Varney Biles Authorized by: Varney Biles   Consent:    Consent obtained:  Verbal   Consent given by:  Spouse   Risks discussed:  Infection, pain and poor wound healing   Alternatives discussed:  No treatment Anesthesia (see MAR for exact dosages):    Anesthesia method:  Local infiltration   Local anesthetic:  Lidocaine 1% WITH epi Laceration details:    Location:  Face   Face location:  Forehead   Length (cm):  3 Repair type:    Repair type:  Simple Pre-procedure details:    Preparation:  Patient was prepped and draped in usual sterile fashion Treatment:    Area cleansed with:  Saline   Amount of cleaning:  Standard   Irrigation solution:  Sterile saline   Irrigation method:  Syringe   Visualized foreign bodies/material removed: no   Skin repair:    Repair method:  Sutures   Suture size:  5-0   Suture material:  Chromic gut   Suture technique:  Simple interrupted Approximation:    Approximation:  Close   [place] border: well-aligned   Post-procedure details:    Dressing:  Open (no dressing)   Patient tolerance of procedure:  Tolerated well, no immediate complications   (including critical care time)  Medications Ordered in ED Medications  lidocaine (PF) (XYLOCAINE) 1 % injection 5 mL (not administered)  lidocaine-EPINEPHrine (XYLOCAINE W/EPI) 2 %-1:200000 (PF) injection 10 mL (not administered)     Initial Impression / Assessment and Plan / ED Course  I have reviewed the triage vital signs and the nursing notes.  Pertinent labs & imaging results that were  available during my care of the patient were reviewed by me and considered in my medical decision making (see chart for details).  Clinical Course    DDx includes: - Mechanical falls - ICH - Fractures - Contusions - Soft tissue injury  PT with fall. I was unaware of the hospice status- CT head ordered and reveals acute SDH. Wife at bedside. Reports that pt is at baseline. Declines any intervention for the subdural, EVEN IF IT WERE TO GET WORSE. They have a hospice meeting at 10. Laceration repair and wound dressing to be completed.   Final Clinical Impressions(s) / ED Diagnoses   Final diagnoses:  Fall, initial encounter  Subdural hematoma ([place])  Laceration of head without foreign body, unspecified part of head, initial encounter    New Prescriptions New Prescriptions   NEOMYCIN-BACITRACIN-POLYMYXIN (NEOSPORIN) [phone] OINTMENT    Apply topically 4 (four) times daily.     Varney Biles, MD 07/03/16 [phone]

## 2016-07-03 NOTE — ED Triage Notes (Signed)
Patient is coming from [facility].  Patient is being seen for an unwitnessed fall.  Patient can not confirm or deny LOC.  Patient is not taking anticoagulants.  Patient has a small laceration above the right eye.  Bleeding controlled on arrival of EMS.  Patient denies any pain.

## 2016-07-03 NOTE — Discharge Instructions (Signed)
We are sorry - I wasn't aware of the HOSPICE status for Mr. Swider -and so we had done the CT scan of the head which showed a small brain bleed (read the report below). Mr. Kosterman is still alert and himself, and you have expressed to be discharged home for hospice care.   The wounds have been taped. The cut to face has been repaired with dissolvable sutures. Our best wishes to you and Mr. Arntzen.   CT HEAD: 2 mm acute LEFT frontal subdural hematoma without mass effect.  Involutional changes. Moderate to severe chronic small vessel ischemic disease.  Small RIGHT frontal scalp hematoma.  No skull fracture.

## 2016-07-03 NOTE — ED Notes (Signed)
Bed: [id number] Expected date:  Expected time:  Means of arrival:  Comments: 82 yr old fall, laceration

## 2016-07-03 NOTE — Progress Notes (Signed)
History and Physical    Provider:  Dr. Jeanmarie Hubert  Location:  [place] Room Number: [place] of Service:  SNF [phone])  PCP: Jeanmarie Hubert, MD Patient Care Team: [name], MD as PCP - General (Internal Medicine) [facility] [name], MD as Consulting Physician (Ophthalmology) [name], MD as Consulting Physician (Gastroenterology) [name], MD as Consulting Physician (Urology) [name], MD as Consulting Physician (Dermatology)  Extended Emergency Contact Information Primary Emergency Contact: Jue,Barbara Address: [address]          [place], [name] [address] [place] Phone: [phone] Relation: Spouse Secondary Emergency Contact: [place] Phone: [phone] Relation: Daughter  Code Status: DNR Goals of Care: Advanced Directive information Advanced Directives 07/03/2016  Does patient have an advance directive? Yes  Type of Paramedic of [place];Living will;Out of facility DNR (pink MOST or yellow form)  Does patient want to make changes to advanced directive? -  Copy of advanced directive(s) in chart? Yes  Pre-existing out of facility DNR order (yellow form or pink MOST form) Yellow form placed in chart (order not valid for inpatient use);Pink MOST form placed in chart (order not valid for inpatient use)      Chief Complaint  Patient presents with  . New Admit To SNF    following hospitalization 06/30/16 to 07/01/16 abdominal pain  . Fall    07/03/16 fell in room, went to ER. 3cm laceration right forehead, skin tear over RUE, subdural hematome.     HPI: Patient is a 82 y.o. male seen today for admission to [facility] SNF following hospitalization 06/30/16 to 07/01/16. Admitted with abd pain and determined to have CAP related to probable aspiration. He has a known dysphagia that has been evaluated in the past.Treated with  Augmentin. He has been declining overall recently and is transferred to the SNF with some hope he will rally, but with the knowledge he may continue to decline. He is now on [facility]osp[facility] SNF.   Family requests no more hospitalization, unless comfort needs cannot be met at SNF.  He fell last night and sustained laceration of the right eyebrow and skin tears of the right forearm.  Past Medical History:  Diagnosis Date  . Colon tumor 1985  . Elevated prostate specific antigen (PSA) 05/13/2011  . Herpes zoster 05/13/2011  . Herpes zoster with ophthalmic complication 01/29/8412  . Hypertrophy of prostate without urinary obstruction and other lower urinary tract symptoms (LUTS) 09/20/2010  . Memory loss 03/25/2009  . 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 Nodular prostate without urinary obstruction 05/18/2012  . Osteoarthrosis, unspecified whether generalized or localized, unspecified site 09/20/2010  . Other and unspecified hyperlipidemia 09/20/2010  . Personal history of colonic polyps 09/20/2010  . Unspecified constipation 09/24/2010  . Unspecified glaucoma(365.9) 09/20/2010   bilateral   Past Surgical History:  Procedure Laterality Date  . APPENDECTOMY  1940  . CATARACT EXTRACTION W/ INTRAOCULAR LENS IMPLANT Right 2010   Dr. Ellie Lunch  . COLECTOMY  07/17/1997   sigmoid submucosal lipoma Dr. Oletta Lamas  . COLONOSCOPY  2000   normal  . COLONOSCOPY  04/21/2002   polypectomy  . COLONOSCOPY  06/10/2004   no polyps  . SKIN CANCER EXCISION Right 05/19/2013   ear Dr. Dannette Barbara  . TONSILLECTOMY  1938  . TUMOR REMOVAL  1993   Removal of Benign Intestinal Tumor   . [place]  reports that he quit smoking about 42 years ago. He has never used smokeless tobacco. He reports that he does not drink alcohol or use drugs. Social History   Social History  . Marital status: Married    Spouse name: N/A  . Number of children: N/A  . Years of education: N/A   Occupational History  .  retired Medical illustrator    Social History Main Topics  . Smoking status: Former Smoker    Quit date: 04/12/1974  . Smokeless tobacco: Never Used  . Alcohol use No     Comment: none in several years  . Drug use: No  . Sexual activity: Not on file   Other Topics Concern  . Not on file   Social History Narrative   Lives at [facility] with wife, moved in 02/10/2006, moved to skill unit 07/02/16   Former smoker -stopped 1975   Alcohol none   Exercise 6 days a week   POA, MOST, DNR, Living Will    Functional Status Survey:    Family History  Problem Relation Age of Onset  . Heart disease Father     Health Maintenance  Topic Date Due  . TETANUS/TDAP  09/29/2004  . INFLUENZA VACCINE  04/29/2016  . ZOSTAVAX  Completed  . PNA vac Low Risk Adult  Completed    No Known Allergies    Medication List       Accurate as of 07/03/16 12:02 PM. Always use your most recent med list.          acetaminophen 650 MG CR tablet Commonly known as:  TYLENOL Take 1,300 mg by mouth at bedtime.   albuterol (2.5 MG/3ML) 0.083% nebulizer solution Commonly known as:  PROVENTIL Take 3 mLs (2.5 mg total) by nebulization every 4 (four) hours as needed for wheezing or shortness of breath.   amoxicillin-clavulanate 875-125 MG tablet Commonly known as:  AUGMENTIN Take 1 tablet by mouth 2 (two) times daily.   CENTRUM tablet Take 1 tablet by mouth daily.   docusate sodium 100 MG capsule Commonly known as:  COLACE Take 100 mg by mouth daily as needed for mild constipation.   dorzolamide-timolol 22.3-6.8 MG/ML ophthalmic solution Commonly known as:  COSOPT Place 1 drop into both eyes 2 (two) times daily.   finasteride 5 MG tablet Commonly known as:  PROSCAR Take one tablet by mouth once daily to shrink the prostate   latanoprost 0.005 % ophthalmic solution Commonly known as:  XALATAN Place 1 drop into both eyes at bedtime.   LORazepam 2 MG/ML concentrated solution Commonly known as:   ATIVAN Take 1 mL (2 mg total) by mouth every 4 (four) hours as needed for anxiety or sedation (or dyspnea).   morphine 20 MG/5ML solution Take 1.3 mLs (5.2 mg total) by mouth every 2 (two) hours as needed for pain.   neomycin-bacitracin-polymyxin [phone] ointment Apply topically 4 (four) times daily.   polyethylene glycol packet Commonly known as:  MIRALAX / GLYCOLAX Take 17 g by mouth daily as needed for moderate constipation.   tamsulosin 0.4 MG Caps capsule Commonly known as:  FLOMAX TAKE ONE CAPSULE BY MOUTH ONCE DAILY TO HELP PROSTATE AND TO REDUCE URINARY FREQUENCY       Review of Systems  Constitutional: Positive for activity change. Negative for appetite change, chills, fatigue and fever.  HENT: Positive for hearing loss and trouble swallowing.   Eyes: Positive for visual disturbance (corrective lenses).  Respiratory: Positive for cough.   Cardiovascular: Negative for  chest pain, palpitations and leg swelling.  Gastrointestinal:       Choking when swallowing. Improved with instructions on chin tuck maneuver.  Endocrine: Negative.   Genitourinary: Positive for difficulty urinating, frequency and urgency.       Hx of nodular prostate.  Musculoskeletal: Positive for gait problem (Using walker with 2 front wheels and rear skids. Right foot dragging.).       Miild discomfort in the left posterior knee. No  Effusion. Left shoulder pain when he lays on it. History compression fracture at T12 in 2005. Increased falls in May 2016. Pain in low back and lumbosacral area.  Skin: Positive for wound (right eyebrow. Skin ters of the right forearm.).       History of herpes zoster in the ophthalmic branch that is healed. Generalized thin skin. Skin tear of the right wrist from a fall.  Neurological: Positive for dizziness and weakness. Negative for seizures, light-headedness, numbness and headaches.       Memory loss  Hematological: Negative.   Psychiatric/Behavioral: Positive  for confusion and decreased concentration.    Vitals:   07/03/16 1152  BP: (!) 154/87  Pulse: (!) 102  Resp: (!) 22  Temp: 97.8 F (36.6 C)  SpO2: 93%  Weight: 162 lb (73.5 kg)  Height: _0  (1.727 m)   Body mass index is 24.63 kg/m. Physical Exam  Constitutional: He appears well-developed and well-nourished. No distress.  HENT:  Head: Normocephalic and atraumatic.  Eyes:  Corrective lenses.  Neck: Neck supple. No JVD present. No tracheal deviation present. No thyromegaly present.  Cardiovascular: Normal rate, normal heart sounds and intact distal pulses.  Exam reveals no gallop and no friction rub.   No murmur heard. Irregular rhythm.  Pulmonary/Chest: No respiratory distress. He has no wheezes. He has no rales. He exhibits no tenderness.  Abdominal: He exhibits no distension and no mass. There is no tenderness.  Genitourinary:  Genitourinary Comments: Prostate is 2+ enlarged and nodular.  Musculoskeletal: Normal range of motion. He exhibits no edema or tenderness.  Mild discomfort in the left posterior knee. No effusion. Gait instability. Full range of motion of the left shoulder. Tender left buttock and hip. Halting gait. Using walker with 2 front wheels and rear skids. Left foot externally deviated when walking.  Lymphadenopathy:    He has no cervical adenopathy.  Neurological: He is alert. No cranial nerve deficit. Coordination normal.  05/24/13 MMSE 28/30. Failed clock drawing. 11/20/15 MMSE 24/30. Failed clock drawing. Normal vibratory sensation. Stomping gait with the right foot.  Skin: No rash noted. No erythema. No pallor.   Actinic keratosis left cheek with some bleeding. Sutures of the right eyebrow. Skin tears of the right forearm.  Psychiatric: He has a normal mood and affect. His behavior is normal.    Labs reviewed: Basic Metabolic Panel:  Recent Labs  11/12/15 06/30/16 1420 07/01/16 0354  NA 136* 137 136  K 4.5 3.9 3.7  CL  --  104 109  CO2  --   26 22  GLUCOSE  --  107* 92  BUN 22* 22* 17  CREATININE 0.9 0.90 0.86  CALCIUM  --  8.7* 7.9*   Liver Function Tests:  Recent Labs  11/12/15 06/30/16 1420  AST 16 18  ALT 9* 12*  ALKPHOS 84 79  BILITOT  --  1.1  PROT  --  6.9  ALBUMIN  --  3.6    Recent Labs  06/30/16 1420  LIPASE 19   No  results for input(s): AMMONIA in the last 8760 hours. CBC:  Recent Labs  06/30/16 1420 07/01/16 0354  WBC 12.7* 11.8*  NEUTROABS  --  8.4*  HGB 12.5* 10.8*  HCT 37.9* 32.6*  MCV 92.0 91.8  PLT 168 165   Cardiac Enzymes: No results for input(s): CKTOTAL, CKMB, CKMBINDEX, TROPONINI in the last 8760 hours. BNP: Invalid input(s): POCBNP No results found for: HGBA1C Lab Results  Component Value Date   TSH 2.68 11/12/2015   No results found for: VITAMINB12 No results found for: FOLATE No results found for: IRON, TIBC, FERRITIN  Imaging and Procedures obtained prior to SNF admission: Dg Pelvis 1-2 Views  Result Date: 07/03/2016 CLINICAL DATA:  Unwitnessed fall at nursing facility today. EXAM: PELVIS - 1-2 VIEW COMPARISON:  CT abdomen and pelvis June 30, 2016 FINDINGS: There is no evidence of pelvic fracture or diastasis. No pelvic bone lesions are seen. Osteopenia. Sacroiliac ankylosis. Gas projecting over LEFT hip corresponds to colonic gas within inguinal hernia as seen on recent CT. Mild vascular calcifications. IMPRESSION: Negative. If there is high clinical suspicion for occult hip fracture or the patient refuses to bear weight, consider further evaluation with MRI. Although CT is expeditious, evidence is lacking regarding accuracy of CT over plain film radiography. Electronically Signed   By: Elon Alas M.D.   On: 07/03/2016 06:38   Ct Head Wo Contrast  Result Date: 07/03/2016 CLINICAL DATA:  Unwitnessed fall. Unknown loss of consciousness. History of memory loss, prostate cancer. EXAM: CT HEAD WITHOUT CONTRAST CT CERVICAL SPINE WITHOUT CONTRAST TECHNIQUE:  Multidetector CT imaging of the head and cervical spine was performed following the standard protocol without intravenous contrast. Multiplanar CT image reconstructions of the cervical spine were also generated. COMPARISON:  None. FINDINGS: CT HEAD FINDINGS BRAIN: The ventricles and sulci are normal for age. No intraparenchymal hemorrhage, mass effect nor midline shift. Patchy to confluent supratentorial white matter hypodensities. Old RIGHT caudate head lacunar infarct. No acute large vascular territory infarcts. 2 mm dense LEFT frontal subdural hematoma. Basal cisterns are patent VASCULAR: Moderate calcific atherosclerosis of the carotid siphons. SKULL: No skull fracture. Small RIGHT frontal scalp hematoma without subcutaneous gas or radiopaque foreign bodies. SINUSES/ORBITS: The mastoid air-cells and included paranasal sinuses are well-aerated.The included ocular globes and orbital contents are non-suspicious. OTHER: None. CT CERVICAL SPINE FINDINGS ALIGNMENT: Maintained cervical lordosis. Grade 1 C5-6 anterolisthesis. Grade 1 C7-T1 anterolisthesis. SKULL BASE AND VERTEBRAE: Cervical vertebral bodies and posterior elements are intact. Moderate to severe C6-7 disc height loss, moderate at C7-T1 with endplate sclerosis and marginal spurring compatible with degenerative discs, mild at C3-4 and C4-5. Severe cervical facet arthropathy. C1-2 articulation maintained with severe arthropathy. Osteopenia without destructive bony lesions. SOFT TISSUES AND SPINAL CANAL: Soft tissues are nonacute. Moderate calcific atherosclerosis of the carotid bulbs. DISC LEVELS: No significant osseous canal stenosis. Severe LEFT C3-4, severe RIGHT greater than LEFT C4-5, moderate C5-6 and moderate C6-7 neural foraminal narrowing. UPPER CHEST: Small RIGHT pleural effusion. OTHER: None. IMPRESSION: CT HEAD: 2 mm acute LEFT frontal subdural hematoma without mass effect. Involutional changes. Moderate to severe chronic small vessel ischemic  disease. Small RIGHT frontal scalp hematoma.  No skull fracture. CT CERVICAL SPINE: No acute fracture. C5-6 and C7-T1 anterolisthesis on degenerative basis. Small RIGHT pleural effusion.  Consider dedicated chest radiograph. Acute findings discussed with and reconfirmed by [facility] NANAVATI on 07/03/2016 at 6:51 am. Electronically Signed   By: Elon Alas M.D.   On: 07/03/2016 06:51   Ct Cervical Spine  Wo Contrast  Result Date: 07/03/2016 CLINICAL DATA:  Unwitnessed fall. Unknown loss of consciousness. History of memory loss, prostate cancer. EXAM: CT HEAD WITHOUT CONTRAST CT CERVICAL SPINE WITHOUT CONTRAST TECHNIQUE: Multidetector CT imaging of the head and cervical spine was performed following the standard protocol without intravenous contrast. Multiplanar CT image reconstructions of the cervical spine were also generated. COMPARISON:  None. FINDINGS: CT HEAD FINDINGS BRAIN: The ventricles and sulci are normal for age. No intraparenchymal hemorrhage, mass effect nor midline shift. Patchy to confluent supratentorial white matter hypodensities. Old RIGHT caudate head lacunar infarct. No acute large vascular territory infarcts. 2 mm dense LEFT frontal subdural hematoma. Basal cisterns are patent VASCULAR: Moderate calcific atherosclerosis of the carotid siphons. SKULL: No skull fracture. Small RIGHT frontal scalp hematoma without subcutaneous gas or radiopaque foreign bodies. SINUSES/ORBITS: The mastoid air-cells and included paranasal sinuses are well-aerated.The included ocular globes and orbital contents are non-suspicious. OTHER: None. CT CERVICAL SPINE FINDINGS ALIGNMENT: Maintained cervical lordosis. Grade 1 C5-6 anterolisthesis. Grade 1 C7-T1 anterolisthesis. SKULL BASE AND VERTEBRAE: Cervical vertebral bodies and posterior elements are intact. Moderate to severe C6-7 disc height loss, moderate at C7-T1 with endplate sclerosis and marginal spurring compatible with degenerative discs, mild at C3-4 and  C4-5. Severe cervical facet arthropathy. C1-2 articulation maintained with severe arthropathy. Osteopenia without destructive bony lesions. SOFT TISSUES AND SPINAL CANAL: Soft tissues are nonacute. Moderate calcific atherosclerosis of the carotid bulbs. DISC LEVELS: No significant osseous canal stenosis. Severe LEFT C3-4, severe RIGHT greater than LEFT C4-5, moderate C5-6 and moderate C6-7 neural foraminal narrowing. UPPER CHEST: Small RIGHT pleural effusion. OTHER: None. IMPRESSION: CT HEAD: 2 mm acute LEFT frontal subdural hematoma without mass effect. Involutional changes. Moderate to severe chronic small vessel ischemic disease. Small RIGHT frontal scalp hematoma.  No skull fracture. CT CERVICAL SPINE: No acute fracture. C5-6 and C7-T1 anterolisthesis on degenerative basis. Small RIGHT pleural effusion.  Consider dedicated chest radiograph. Acute findings discussed with and reconfirmed by [facility] NANAVATI on 07/03/2016 at 6:51 am. Electronically Signed   By: Elon Alas M.D.   On: 07/03/2016 06:51    Assessment/Plan 1. Community acquired pneumonia of right lower lobe of lung ([place]) Finish Augmentin on 07/09/16.  2. Dysphagia, pharyngoesophageal phase Had prior evaluation., Continue to provide normal diet.  3. Fall, subsequent encounter High risk for more falls  4. Memory loss Gradually worsening  5. Failure to thrive in adult Unlikely to improve  6. Abnormality of gait continue with Rehab strengthening and attempts to improve standing and walking. Currently requires assistance of 2 to stand safely.

## 2016-07-05 LAB — CULTURE, BLOOD (ROUTINE X 2)
CULTURE: NO GROWTH
Culture: NO GROWTH

## 2016-07-23 ENCOUNTER — Other Ambulatory Visit: Payer: Self-pay

## 2016-07-23 DIAGNOSIS — E870 Hyperosmolality and hypernatremia: Secondary | ICD-10-CM

## 2016-07-24 DIAGNOSIS — Z029 Encounter for administrative examinations, unspecified: Secondary | ICD-10-CM

## 2016-08-05 ENCOUNTER — Encounter: Payer: Self-pay | Admitting: Nurse Practitioner

## 2016-08-05 ENCOUNTER — Non-Acute Institutional Stay (SKILLED_NURSING_FACILITY): Payer: PPO | Admitting: Nurse Practitioner

## 2016-08-05 DIAGNOSIS — R1314 Dysphagia, pharyngoesophageal phase: Secondary | ICD-10-CM

## 2016-08-05 DIAGNOSIS — N400 Enlarged prostate without lower urinary tract symptoms: Secondary | ICD-10-CM | POA: Diagnosis not present

## 2016-08-05 DIAGNOSIS — M150 Primary generalized (osteo)arthritis: Secondary | ICD-10-CM | POA: Diagnosis not present

## 2016-08-05 DIAGNOSIS — R627 Adult failure to thrive: Secondary | ICD-10-CM

## 2016-08-05 DIAGNOSIS — M159 Polyosteoarthritis, unspecified: Secondary | ICD-10-CM

## 2016-08-05 DIAGNOSIS — R413 Other amnesia: Secondary | ICD-10-CM | POA: Diagnosis not present

## 2016-08-05 NOTE — Assessment & Plan Note (Signed)
managed with Finasteride and Tamsulosin

## 2016-08-05 NOTE — Assessment & Plan Note (Signed)
Diet as tolerated

## 2016-08-05 NOTE — Progress Notes (Signed)
Location:  [facility] Room Number: 7 Place of Service:  SNF (31) Provider:  Piotr Christopher, Manxie  NP  Jeanmarie Hubert, MD  Patient Care Team: [name], MD as PCP - General (Internal Medicine) [facility] [name], MD as Consulting Physician (Ophthalmology) [name], MD as Consulting Physician (Gastroenterology) [name], MD as Consulting Physician (Urology) [name], MD as Consulting Physician (Dermatology)  Extended Emergency Contact Information Primary Emergency Contact: Muller,Barbara Address: [address]          [place],  [address] [place] Phone: [phone] Relation: Spouse Secondary Emergency Contact: [place] of [place] Phone: [phone] Relation: Daughter  Code Status:  DNR Goals of care: Advanced Directive information Advanced Directives 08/05/2016  Does patient have an advance directive? Yes  Type of Paramedic of [place];Living will;Out of facility DNR (pink MOST or yellow form)  Does patient want to make changes to advanced directive? No - Patient declined  Copy of advanced directive(s) in chart? Yes  Pre-existing out of facility DNR order (yellow form or pink MOST form) -     Chief Complaint  Patient presents with  . Medical Management of Chronic Issues    HPI:  Joel [name] is a 82 y.o. male seen today for medical management of chronic diseases.    The patient was admitted to SNF following hospital stay from 06/30/16 to 07/01/16 for CAP possible related to aspiration, hx of dysphagia, fully treated with Augmentin.   Hx of BPH, managed with Finasteride and Tamsulosin. Memory loss, progressing, not taking memory preserving meds, desires comfort measures for future plan of care, Morphine and Lorazepam available to him, otherwise taking Tylenol 1300mg  hs, prn MiraLax and Colace for constipation.  Past Medical History:  Diagnosis Date    . Colon tumor 1985  . Elevated prostate specific antigen (PSA) 05/13/2011  . Herpes zoster 05/13/2011  . Herpes zoster with ophthalmic complication [id number]  . Hypertrophy of prostate without urinary obstruction and other lower urinary tract symptoms (LUTS) 09/20/2010  . Memory loss 03/25/2009  . 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 Nodular prostate without urinary obstruction 05/18/2012  . Osteoarthrosis, unspecified whether generalized or localized, unspecified site 09/20/2010  . Other and unspecified hyperlipidemia 09/20/2010  . Personal history of colonic polyps 09/20/2010  . Unspecified constipation 09/24/2010  . Unspecified glaucoma(365.9) 09/20/2010   bilateral   Past Surgical History:  Procedure Laterality Date  . APPENDECTOMY  1940  . CATARACT EXTRACTION W/ INTRAOCULAR LENS IMPLANT Right 2010   Dr. Ellie Lunch  . COLECTOMY  07/17/1997   sigmoid submucosal lipoma Dr. Oletta Lamas  . COLONOSCOPY  2000   normal  . COLONOSCOPY  04/21/2002   polypectomy  . COLONOSCOPY  06/10/2004   no polyps  . SKIN CANCER EXCISION Right 05/19/2013   ear Dr. Dannette Barbara  . TONSILLECTOMY  1938  . TUMOR REMOVAL  1993   Removal of Benign Intestinal Tumor   . VASECTOMY  1972    No Known Allergies    Medication List       Accurate as of 08/05/16  1:46 PM. Always use your most recent med list.          acetaminophen 650 MG CR tablet Commonly known as:  TYLENOL Take 1,300 mg by mouth at bedtime.   albuterol (2.5 MG/3ML) 0.083% nebulizer solution Commonly known as:  PROVENTIL Take 3 mLs (2.5 mg total) by nebulization every 4 (four) hours  as needed for wheezing or shortness of breath.   CENTRUM tablet Take 1 tablet by mouth daily.   docusate sodium 100 MG capsule Commonly known as:  COLACE Take 100 mg by mouth daily as needed for mild constipation.   dorzolamide-timolol 22.3-6.8 MG/ML ophthalmic solution Commonly known as:  COSOPT Place 1 drop into both eyes 2 (two) times  daily.   finasteride 5 MG tablet Commonly known as:  PROSCAR Take one tablet by mouth once daily to shrink the prostate   hydrocortisone cream 1 % Apply 1 application topically as needed for itching.   latanoprost 0.005 % ophthalmic solution Commonly known as:  XALATAN Place 1 drop into both eyes at bedtime.   LORazepam 2 MG/ML concentrated solution Commonly known as:  ATIVAN Take 1 mL (2 mg total) by mouth every 4 (four) hours as needed for anxiety or sedation (or dyspnea).   morphine 20 MG/5ML solution Take 1.3 mLs (5.2 mg total) by mouth every 2 (two) hours as needed for pain.   neomycin-bacitracin-polymyxin [phone] ointment Apply topically 4 (four) times daily.   polyethylene glycol packet Commonly known as:  MIRALAX / GLYCOLAX Take 17 g by mouth daily as needed for moderate constipation.   tamsulosin 0.4 MG Caps capsule Commonly known as:  FLOMAX TAKE ONE CAPSULE BY MOUTH ONCE DAILY TO HELP PROSTATE AND TO REDUCE URINARY FREQUENCY       Review of Systems  Constitutional: Positive for activity change. Negative for appetite change, chills, fatigue and fever.  HENT: Positive for hearing loss and trouble swallowing.   Eyes: Positive for visual disturbance (corrective lenses).  Respiratory: Positive for cough.   Cardiovascular: Negative for chest pain, palpitations and leg swelling.  Gastrointestinal:       Choking when swallowing. Improved with instructions on chin tuck maneuver.  Endocrine: Negative.   Genitourinary: Positive for difficulty urinating, frequency and urgency.       Hx of nodular prostate.  Musculoskeletal: Positive for gait problem (Using walker with 2 front wheels and rear skids. Right foot dragging.).       Miild discomfort in the left posterior knee. No  Effusion. Left shoulder pain when he lays on it. History compression fracture at T12 in 2005. Increased falls in May 2016. Pain in low back and lumbosacral area.  Skin: Positive for wound  (right eyebrow. Skin ters of the right forearm.).       History of herpes zoster in the ophthalmic branch that is healed. Generalized thin skin. Skin tear of the right wrist from a fall.  Neurological: Positive for dizziness and weakness. Negative for seizures, light-headedness, numbness and headaches.       Memory loss  Hematological: Negative.   Psychiatric/Behavioral: Positive for confusion and decreased concentration.    Immunization History  Administered Date(s) Administered  . Influenza Whole 06/29/2012, 06/30/2013  . Influenza-Unspecified 07/10/2015, 07/10/2016  . Pneumococcal Conjugate-13 05/20/2016  . Pneumococcal Polysaccharide-23 09/30/1995  . Td 09/29/1994  . Zoster 09/30/2007   Pertinent  Health Maintenance Due  Topic Date Due  . INFLUENZA VACCINE  Completed  . PNA vac Low Risk Adult  Completed   Fall Risk  07/03/2016 05/20/2016 02/05/2016 11/20/2015 05/22/2015  Falls in the past year? Yes Yes Yes Yes Yes  Number falls in past yr: 2 or more 2 or more 2 or more 1 2 or more  Injury with Fall? Yes No Yes Yes Yes  Risk Factor Category  - High Fall Risk High Fall Risk - High Fall Risk  Risk for fall due to : - - History of fall(s);Impaired mobility - -   Functional Status Survey:    Vitals:   08/05/16 1211  BP: (!) 110/50  Pulse: 64  Resp: 18  Temp: 97.7 F (36.5 C)  SpO2: 97%  Weight: 162 lb (73.5 kg)  Height: 5\' 8"  (1.727 m)   Body mass index is 24.63 kg/m. Physical Exam  Constitutional: He appears well-developed and well-nourished. No distress.  HENT:  Head: Normocephalic and atraumatic.  Eyes:  Corrective lenses.  Neck: Neck supple. No JVD present. No tracheal deviation present. No thyromegaly present.  Cardiovascular: Normal rate, normal heart sounds and intact distal pulses.  Exam reveals no gallop and no friction rub.   No murmur heard. Irregular rhythm.  Pulmonary/Chest: No respiratory distress. He has no wheezes. He has no rales. He exhibits no  tenderness.  Abdominal: He exhibits no distension and no mass. There is no tenderness.  Genitourinary:  Genitourinary Comments: Prostate is 2+ enlarged and nodular.  Musculoskeletal: Normal range of motion. He exhibits no edema or tenderness.  Mild discomfort in the left posterior knee. No effusion. Gait instability. Full range of motion of the left shoulder. Tender left buttock and hip. Halting gait. Using walker with 2 front wheels and rear skids. Left foot externally deviated when walking.  Lymphadenopathy:    He has no cervical adenopathy.  Neurological: He is alert. No cranial nerve deficit. Coordination normal.  05/24/13 MMSE 28/30. Failed clock drawing. 11/20/15 MMSE 24/30. Failed clock drawing. Normal vibratory sensation. Stomping gait with the right foot.  Skin: No rash noted. No erythema. No pallor.   Actinic keratosis left cheek with some bleeding. Sutures of the right eyebrow. Skin tears of the right forearm.  Psychiatric: He has a normal mood and affect. His behavior is normal.    Labs reviewed:  Recent Labs  11/12/15 06/30/16 1420 07/01/16 0354  NA 136* 137 136  K 4.5 3.9 3.7  CL  --  104 109  CO2  --  26 22  GLUCOSE  --  107* 92  BUN 22* 22* 17  CREATININE 0.9 0.90 0.86  CALCIUM  --  8.7* 7.9*    Recent Labs  11/12/15 06/30/16 1420  AST 16 18  ALT 9* 12*  ALKPHOS 84 79  BILITOT  --  1.1  PROT  --  6.9  ALBUMIN  --  3.6    Recent Labs  06/30/16 1420 07/01/16 0354  WBC 12.7* 11.8*  NEUTROABS  --  8.4*  HGB 12.5* 10.8*  HCT 37.9* 32.6*  MCV 92.0 91.8  PLT 168 165   Lab Results  Component Value Date   TSH 2.68 11/12/2015   No results found for: HGBA1C Lab Results  Component Value Date   CHOL 173 11/12/2015   HDL 44 11/12/2015   LDLCALC 109 11/12/2015   TRIG 102 11/12/2015    Significant Diagnostic Results in last 30 days:  No results found.  Assessment/Plan Dysphagia, pharyngoesophageal phase Diet as tolerated.    Osteoarthritis Continue Tylenol, prn Morphine for pain  BPH (benign prostatic hyperplasia) managed with Finasteride and Tamsulosin  Memory loss Progressing, continue SNF for care assistance.   Failure to thrive in adult Supportive and comfort measures.      Family/ staff Communication: continue Hospice service and SNF for care assistance  Labs/tests ordered:  none

## 2016-08-05 NOTE — Assessment & Plan Note (Signed)
Progressing, continue SNF for care assistance.

## 2016-08-05 NOTE — Assessment & Plan Note (Signed)
Continue Tylenol, prn Morphine for pain

## 2016-08-05 NOTE — Assessment & Plan Note (Signed)
Supportive and comfort measures.

## 2016-09-30 ENCOUNTER — Non-Acute Institutional Stay (SKILLED_NURSING_FACILITY): Payer: PPO | Admitting: Nurse Practitioner

## 2016-09-30 ENCOUNTER — Encounter: Payer: Self-pay | Admitting: Nurse Practitioner

## 2016-09-30 DIAGNOSIS — R1314 Dysphagia, pharyngoesophageal phase: Secondary | ICD-10-CM | POA: Diagnosis not present

## 2016-09-30 DIAGNOSIS — R413 Other amnesia: Secondary | ICD-10-CM | POA: Diagnosis not present

## 2016-09-30 DIAGNOSIS — R627 Adult failure to thrive: Secondary | ICD-10-CM

## 2016-09-30 DIAGNOSIS — N400 Enlarged prostate without lower urinary tract symptoms: Secondary | ICD-10-CM | POA: Diagnosis not present

## 2016-09-30 DIAGNOSIS — M150 Primary generalized (osteo)arthritis: Secondary | ICD-10-CM

## 2016-09-30 DIAGNOSIS — M159 Polyosteoarthritis, unspecified: Secondary | ICD-10-CM

## 2016-09-30 NOTE — Assessment & Plan Note (Signed)
Comfort measures in SNF

## 2016-09-30 NOTE — Assessment & Plan Note (Signed)
Progressing, continue SNF for care assistance. Lorazepam prn used x 5 in the past 2 weeks, mostly in midnight, effective, provided comfort measures in his care, continue, observe.

## 2016-09-30 NOTE — Assessment & Plan Note (Signed)
Diet as tolerated

## 2016-09-30 NOTE — Assessment & Plan Note (Signed)
managed with Finasteride and Tamsulosin

## 2016-09-30 NOTE — Assessment & Plan Note (Signed)
Continue Tylenol, prn Morphine for pain

## 2016-09-30 NOTE — Progress Notes (Signed)
Location:  [facility] Room Number: 7 Place of Service:  SNF (31) Provider:  Brooklee Michelin, Manxie  NP  Jeanmarie Hubert, MD  Patient Care Team: [name], MD as PCP - General (Internal Medicine) [facility] [name], MD as Consulting Physician (Ophthalmology) [name], MD as Consulting Physician (Gastroenterology) [name], MD as Consulting Physician (Urology) [name], MD as Consulting Physician (Dermatology)  Extended Emergency Contact Information Primary Emergency Contact: Manton,Barbara Address: [address]          [place], [place] [address] [place] Phone: [phone] Relation: Spouse Secondary Emergency Contact: [place] of [place] Phone: [phone] Relation: Daughter  Code Status:  DNR Goals of care: Advanced Directive information Advanced Directives 09/30/2016  Does Patient Have a Medical Advance Directive? Yes  Type of Paramedic of [place];Living will;Out of facility DNR (pink MOST or yellow form)  Does patient want to make changes to medical advance directive? No - Patient declined  Copy of [place] in Chart? Yes  Pre-existing out of facility DNR order (yellow form or pink MOST form) -     Chief Complaint  Patient presents with  . Medical Management of Chronic Issues    medication re-evaluation    HPI:  Pt is a 83 y.o. male seen today for medical management of chronic diseases.    Hx of BPH, managed with Finasteride and Tamsulosin. Memory loss, progressing, not taking memory preserving meds, desires comfort measures for future plan of care, Morphine and Lorazepam available to him( x5/2 weeks), otherwise taking Tylenol 1300mg  hs, prn MiraLax and Colace for constipation.    Past Medical History:  Diagnosis Date  . Colon tumor 1985  . Elevated prostate specific antigen (PSA) 05/13/2011  . Herpes zoster 05/13/2011  .  Herpes zoster with ophthalmic complication 99991111  . Hypertrophy of prostate without urinary obstruction and other lower urinary tract symptoms (LUTS) 09/20/2010  . Memory loss 03/25/2009  . 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 Nodular prostate without urinary obstruction 05/18/2012  . Osteoarthrosis, unspecified whether generalized or localized, unspecified site 09/20/2010  . Other and unspecified hyperlipidemia 09/20/2010  . Personal history of colonic polyps 09/20/2010  . Unspecified constipation 09/24/2010  . Unspecified glaucoma(365.9) 09/20/2010   bilateral   Past Surgical History:  Procedure Laterality Date  . APPENDECTOMY  1940  . CATARACT EXTRACTION W/ INTRAOCULAR LENS IMPLANT Right 2010   Dr. Ellie Lunch  . COLECTOMY  07/17/1997   sigmoid submucosal lipoma Dr. Oletta Lamas  . COLONOSCOPY  2000   normal  . COLONOSCOPY  04/21/2002   polypectomy  . COLONOSCOPY  06/10/2004   no polyps  . SKIN CANCER EXCISION Right 05/19/2013   ear Dr. Dannette Barbara  . TONSILLECTOMY  1938  . TUMOR REMOVAL  1993   Removal of Benign Intestinal Tumor   . VASECTOMY  1972    No Known Allergies  Allergies as of 09/30/2016   No Known Allergies     Medication List       Accurate as of 09/30/16  4:55 PM. Always use your most recent med list.          acetaminophen 650 MG CR tablet Commonly known as:  TYLENOL Take 1,300 mg by mouth at bedtime.   albuterol (2.5 MG/3ML) 0.083% nebulizer solution Commonly known as:  PROVENTIL Take 3 mLs (2.5 mg total) by nebulization every 4 (four) hours as needed for wheezing or  shortness of breath.   bisacodyl 10 MG suppository Commonly known as:  DULCOLAX Place 10 mg rectally as needed for moderate constipation.   CENTRUM tablet Take 1 tablet by mouth daily.   docusate sodium 100 MG capsule Commonly known as:  COLACE Take 100 mg by mouth daily as needed for mild constipation.   dorzolamide-timolol 22.3-6.8 MG/ML ophthalmic solution Commonly  known as:  COSOPT Place 1 drop into both eyes 2 (two) times daily.   finasteride 5 MG tablet Commonly known as:  PROSCAR Take one tablet by mouth once daily to shrink the prostate   hydrocortisone cream 1 % Apply 1 application topically as needed for itching.   latanoprost 0.005 % ophthalmic solution Commonly known as:  XALATAN Place 1 drop into both eyes at bedtime.   LORazepam 2 MG/ML concentrated solution Commonly known as:  ATIVAN Take 1 mL (2 mg total) by mouth every 4 (four) hours as needed for anxiety or sedation (or dyspnea).   morphine 20 MG/5ML solution Take 1.3 mLs (5.2 mg total) by mouth every 2 (two) hours as needed for pain.   neomycin-bacitracin-polymyxin [phone] ointment Apply topically 4 (four) times daily.   polyethylene glycol packet Commonly known as:  MIRALAX / GLYCOLAX Take 17 g by mouth daily as needed for moderate constipation.   tamsulosin 0.4 MG Caps capsule Commonly known as:  FLOMAX TAKE ONE CAPSULE BY MOUTH ONCE DAILY TO HELP PROSTATE AND TO REDUCE URINARY FREQUENCY       Review of Systems  Constitutional: Positive for activity change. Negative for appetite change, chills, fatigue and fever.  HENT: Positive for hearing loss and trouble swallowing.   Eyes: Positive for visual disturbance (corrective lenses).  Respiratory: Positive for cough.   Cardiovascular: Negative for chest pain, palpitations and leg swelling.  Gastrointestinal:       Choking when swallowing. Improved with instructions on chin tuck maneuver.  Endocrine: Negative.   Genitourinary: Positive for difficulty urinating, frequency and urgency.       Hx of nodular prostate.  Musculoskeletal: Positive for gait problem (Using walker with 2 front wheels and rear skids. Right foot dragging.).       Miild discomfort in the left posterior knee. No  Effusion. Left shoulder pain when he lays on it. History compression fracture at T12 in 2005. Increased falls in May 2016. Pain in  low back and lumbosacral area.  Skin: Positive for wound (right eyebrow. Skin ters of the right forearm.).       History of herpes zoster in the ophthalmic branch that is healed. Generalized thin skin. Skin tear of the right wrist from a fall.  Neurological: Positive for dizziness and weakness. Negative for seizures, light-headedness, numbness and headaches.       Memory loss  Hematological: Negative.   Psychiatric/Behavioral: Positive for confusion, decreased concentration and sleep disturbance.    Immunization History  Administered Date(s) Administered  . Influenza Whole 06/29/2012, 06/30/2013  . Influenza-Unspecified 07/10/2015, 07/10/2016  . Pneumococcal Conjugate-13 05/20/2016  . Pneumococcal Polysaccharide-23 09/30/1995  . Td 09/29/1994  . Zoster 09/30/2007   Pertinent  Health Maintenance Due  Topic Date Due  . INFLUENZA VACCINE  Completed  . PNA vac Low Risk Adult  Completed   Fall Risk  07/03/2016 05/20/2016 02/05/2016 11/20/2015 05/22/2015  Falls in the past year? Yes Yes Yes Yes Yes  Number falls in past yr: 2 or more 2 or more 2 or more 1 2 or more  Injury with Fall? Yes No Yes Yes  Yes  Risk Factor Category  - High Fall Risk High Fall Risk - High Fall Risk  Risk for fall due to : - - History of fall(s);Impaired mobility - -   Functional Status Survey:    Vitals:   09/30/16 1619  BP: 128/79  Pulse: 83  Resp: 18  Temp: 97.7 F (36.5 C)  Weight: 158 lb 6.4 oz (71.8 kg)  Height: 5\' 8"  (1.727 m)   Body mass index is 24.08 kg/m. Physical Exam  Constitutional: He appears well-developed and well-nourished. No distress.  HENT:  Head: Normocephalic and atraumatic.  Eyes:  Corrective lenses.  Neck: Neck supple. No JVD present. No tracheal deviation present. No thyromegaly present.  Cardiovascular: Normal rate, normal heart sounds and intact distal pulses.  Exam reveals no gallop and no friction rub.   No murmur heard. Irregular rhythm.  Pulmonary/Chest: No  respiratory distress. He has no wheezes. He has no rales. He exhibits no tenderness.  Abdominal: He exhibits no distension and no mass. There is no tenderness.  Genitourinary:  Genitourinary Comments: Prostate is 2+ enlarged and nodular.  Musculoskeletal: Normal range of motion. He exhibits no edema or tenderness.  Mild discomfort in the left posterior knee. No effusion. Gait instability. Full range of motion of the left shoulder. Tender left buttock and hip. Halting gait. Using walker with 2 front wheels and rear skids. Left foot externally deviated when walking.  Lymphadenopathy:    He has no cervical adenopathy.  Neurological: He is alert. No cranial nerve deficit. Coordination normal.  05/24/13 MMSE 28/30. Failed clock drawing. 11/20/15 MMSE 24/30. Failed clock drawing. Normal vibratory sensation. Stomping gait with the right foot.  Skin: No rash noted. No erythema. No pallor.   Actinic keratosis left cheek with some bleeding. Sutures of the right eyebrow. Skin tears of the right forearm.  Psychiatric: He has a normal mood and affect. His behavior is normal.    Labs reviewed:  Recent Labs  11/12/15 06/30/16 1420 07/01/16 0354  NA 136* 137 136  K 4.5 3.9 3.7  CL  --  104 109  CO2  --  26 22  GLUCOSE  --  107* 92  BUN 22* 22* 17  CREATININE 0.9 0.90 0.86  CALCIUM  --  8.7* 7.9*    Recent Labs  11/12/15 06/30/16 1420  AST 16 18  ALT 9* 12*  ALKPHOS 84 79  BILITOT  --  1.1  PROT  --  6.9  ALBUMIN  --  3.6    Recent Labs  06/30/16 1420 07/01/16 0354  WBC 12.7* 11.8*  NEUTROABS  --  8.4*  HGB 12.5* 10.8*  HCT 37.9* 32.6*  MCV 92.0 91.8  PLT 168 165   Lab Results  Component Value Date   TSH 2.68 11/12/2015   No results found for: HGBA1C Lab Results  Component Value Date   CHOL 173 11/12/2015   HDL 44 11/12/2015   LDLCALC 109 11/12/2015   TRIG 102 11/12/2015    Significant Diagnostic Results in last 30 days:  No results  found.  Assessment/Plan Dysphagia, pharyngoesophageal phase Diet as tolerated.   Osteoarthritis Continue Tylenol, prn Morphine for pain  BPH (benign prostatic hyperplasia) managed with Finasteride and Tamsulosin  Memory loss Progressing, continue SNF for care assistance. Lorazepam prn used x 5 in the past 2 weeks, mostly in midnight, effective, provided comfort measures in his care, continue, observe.   Failure to thrive in adult Comfort measures in SNF     Family/ staff  Communication:SNF  Labs/tests ordered:  none

## 2016-10-10 ENCOUNTER — Non-Acute Institutional Stay (SKILLED_NURSING_FACILITY): Payer: PPO | Admitting: Nurse Practitioner

## 2016-10-10 DIAGNOSIS — R413 Other amnesia: Secondary | ICD-10-CM

## 2016-10-10 DIAGNOSIS — G4700 Insomnia, unspecified: Secondary | ICD-10-CM | POA: Diagnosis not present

## 2016-10-10 DIAGNOSIS — M150 Primary generalized (osteo)arthritis: Secondary | ICD-10-CM

## 2016-10-10 DIAGNOSIS — R1314 Dysphagia, pharyngoesophageal phase: Secondary | ICD-10-CM

## 2016-10-10 DIAGNOSIS — K4090 Unilateral inguinal hernia, without obstruction or gangrene, not specified as recurrent: Secondary | ICD-10-CM

## 2016-10-10 DIAGNOSIS — N400 Enlarged prostate without lower urinary tract symptoms: Secondary | ICD-10-CM

## 2016-10-10 DIAGNOSIS — M159 Polyosteoarthritis, unspecified: Secondary | ICD-10-CM

## 2016-10-12 DIAGNOSIS — G4700 Insomnia, unspecified: Secondary | ICD-10-CM | POA: Insufficient documentation

## 2016-10-12 DIAGNOSIS — K4090 Unilateral inguinal hernia, without obstruction or gangrene, not specified as recurrent: Secondary | ICD-10-CM | POA: Insufficient documentation

## 2016-10-12 NOTE — Progress Notes (Signed)
Location:  [facility] Room Number: 7 Place of Service:  SNF (31) Provider:  Tachina Spoonemore, [place]  NP  Jeanmarie Hubert, MD  Patient Care Team: [name], MD as PCP - General (Internal Medicine) [facility] [name], MD as Consulting Physician (Ophthalmology) [name], MD as Consulting Physician (Gastroenterology) [name], MD as Consulting Physician (Urology) [name], MD as Consulting Physician (Dermatology)  Extended Emergency Contact Information Primary Emergency Contact: Pollina,Barbara Address: [address]          [place], [place] [address] [place] Phone: [phone] Relation: Spouse Secondary Emergency Contact: [place] of [place] Phone: [phone] Relation: Daughter  Code Status:  DNR Goals of care: Advanced Directive information Advanced Directives 09/30/2016  Does Patient Have a Medical Advance Directive? Yes  Type of Paramedic of [place];Living will;Out of facility DNR (pink MOST or yellow form)  Does patient want to make changes to medical advance directive? No - Patient declined  Copy of [place] in Chart? Yes  Pre-existing out of facility DNR order (yellow form or pink MOST form) -     Chief Complaint  Patient presents with  . Acute Visit    HPI:  Pt is a 83 y.o. male seen today for medical management of chronic diseases.   Restless at night, prn Lorazepam has little efficacy, noted the right direct inguinal hernia, reducible, no pain upon my examination today.    Hx of BPH, managed with Finasteride and Tamsulosin. Memory loss, progressing, not taking memory preserving meds, desires comfort measures for future plan of care, Morphine prn, Tylenol 1300mg  hs, prn MiraLax and Colace for constipation.    Past Medical History:  Diagnosis Date  . Colon tumor 1985  . Elevated prostate specific antigen (PSA) 05/13/2011   . Herpes zoster 05/13/2011  . Herpes zoster with ophthalmic complication [id number]  . Hypertrophy of prostate without urinary obstruction and other lower urinary tract symptoms (LUTS) 09/20/2010  . Memory loss 03/25/2009  . 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 Nodular prostate without urinary obstruction 05/18/2012  . Osteoarthrosis, unspecified whether generalized or localized, unspecified site 09/20/2010  . Other and unspecified hyperlipidemia 09/20/2010  . Personal history of colonic polyps 09/20/2010  . Unspecified constipation 09/24/2010  . Unspecified glaucoma(365.9) 09/20/2010   bilateral   Past Surgical History:  Procedure Laterality Date  . APPENDECTOMY  1940  . CATARACT EXTRACTION W/ INTRAOCULAR LENS IMPLANT Right 2010   Dr. Ellie Lunch  . COLECTOMY  07/17/1997   sigmoid submucosal lipoma Dr. Oletta Lamas  . COLONOSCOPY  2000   normal  . COLONOSCOPY  04/21/2002   polypectomy  . COLONOSCOPY  06/10/2004   no polyps  . SKIN CANCER EXCISION Right 05/19/2013   ear Dr. Dannette Barbara  . TONSILLECTOMY  1938  . TUMOR REMOVAL  1993   Removal of Benign Intestinal Tumor   . VASECTOMY  1972    No Known Allergies  Allergies as of 10/10/2016   No Known Allergies     Medication List       Accurate as of 10/10/16 11:59 PM. Always use your most recent med list.          acetaminophen 650 MG CR tablet Commonly known as:  TYLENOL Take 1,300 mg by mouth at bedtime.   albuterol (2.5 MG/3ML) 0.083% nebulizer solution Commonly known as:  PROVENTIL Take 3 mLs (2.5 mg total) by nebulization every 4 (four)  hours as needed for wheezing or shortness of breath.   bisacodyl 10 MG suppository Commonly known as:  DULCOLAX Place 10 mg rectally as needed for moderate constipation.   CENTRUM tablet Take 1 tablet by mouth daily.   docusate sodium 100 MG capsule Commonly known as:  COLACE Take 100 mg by mouth daily as needed for mild constipation.   dorzolamide-timolol 22.3-6.8  MG/ML ophthalmic solution Commonly known as:  COSOPT Place 1 drop into both eyes 2 (two) times daily.   finasteride 5 MG tablet Commonly known as:  PROSCAR Take one tablet by mouth once daily to shrink the prostate   hydrocortisone cream 1 % Apply 1 application topically as needed for itching.   latanoprost 0.005 % ophthalmic solution Commonly known as:  XALATAN Place 1 drop into both eyes at bedtime.   LORazepam 2 MG/ML concentrated solution Commonly known as:  ATIVAN Take 1 mL (2 mg total) by mouth every 4 (four) hours as needed for anxiety or sedation (or dyspnea).   morphine 20 MG/5ML solution Take 1.3 mLs (5.2 mg total) by mouth every 2 (two) hours as needed for pain.   neomycin-bacitracin-polymyxin [phone] ointment Apply topically 4 (four) times daily.   polyethylene glycol packet Commonly known as:  MIRALAX / GLYCOLAX Take 17 g by mouth daily as needed for moderate constipation.   tamsulosin 0.4 MG Caps capsule Commonly known as:  FLOMAX TAKE ONE CAPSULE BY MOUTH ONCE DAILY TO HELP PROSTATE AND TO REDUCE URINARY FREQUENCY       Review of Systems  Constitutional: Positive for activity change. Negative for appetite change, chills, fatigue and fever.  HENT: Positive for hearing loss and trouble swallowing.   Eyes: Positive for visual disturbance (corrective lenses).  Respiratory: Positive for cough.   Cardiovascular: Negative for chest pain, palpitations and leg swelling.  Gastrointestinal:       Choking when swallowing. Improved with instructions on chin tuck maneuver.  Endocrine: Negative.   Genitourinary: Positive for difficulty urinating, frequency and urgency.       Hx of nodular prostate.  Musculoskeletal: Positive for gait problem (Using walker with 2 front wheels and rear skids. Right foot dragging.).       Miild discomfort in the left posterior knee. No  Effusion. Left shoulder pain when he lays on it. History compression fracture at T12 in  2005. Increased falls in May 2016. Pain in low back and lumbosacral area.  Skin: Positive for wound (right eyebrow. Skin ters of the right forearm.).       History of herpes zoster in the ophthalmic branch that is healed. Generalized thin skin. Skin tear of the right wrist from a fall.  Neurological: Positive for dizziness and weakness. Negative for seizures, light-headedness, numbness and headaches.       Memory loss  Hematological: Negative.   Psychiatric/Behavioral: Positive for confusion, decreased concentration and sleep disturbance.    Immunization History  Administered Date(s) Administered  . Influenza Whole 06/29/2012, 06/30/2013  . Influenza-Unspecified 07/10/2015, 07/10/2016  . Pneumococcal Conjugate-13 05/20/2016  . Pneumococcal Polysaccharide-23 09/30/1995  . Td 09/29/1994  . Zoster 09/30/2007   Pertinent  Health Maintenance Due  Topic Date Due  . INFLUENZA VACCINE  Completed  . PNA vac Low Risk Adult  Completed   Fall Risk  07/03/2016 05/20/2016 02/05/2016 11/20/2015 05/22/2015  Falls in the past year? Yes Yes Yes Yes Yes  Number falls in past yr: 2 or more 2 or more 2 or more 1 2 or more  Injury  with Fall? Yes No Yes Yes Yes  Risk Factor Category  - High Fall Risk High Fall Risk - High Fall Risk  Risk for fall due to : - - History of fall(s);Impaired mobility - -   Functional Status Survey:    There were no vitals filed for this visit. There is no height or weight on file to calculate BMI. Physical Exam  Constitutional: He appears well-developed and well-nourished. No distress.  HENT:  Head: Normocephalic and atraumatic.  Eyes:  Corrective lenses.  Neck: Neck supple. No JVD present. No tracheal deviation present. No thyromegaly present.  Cardiovascular: Normal rate, normal heart sounds and intact distal pulses.  Exam reveals no gallop and no friction rub.   No murmur heard. Irregular rhythm.  Pulmonary/Chest: No respiratory distress. He has no wheezes. He has  no rales. He exhibits no tenderness.  Abdominal: He exhibits no distension and no mass. There is no tenderness.  Genitourinary:  Genitourinary Comments: Prostate is 2+ enlarged and nodular.  Musculoskeletal: Normal range of motion. He exhibits no edema or tenderness.  Mild discomfort in the left posterior knee. No effusion. Gait instability. Full range of motion of the left shoulder. Tender left buttock and hip. Halting gait. Using walker with 2 front wheels and rear skids. Left foot externally deviated when walking.  Lymphadenopathy:    He has no cervical adenopathy.  Neurological: He is alert. No cranial nerve deficit. Coordination normal.  05/24/13 MMSE 28/30. Failed clock drawing. 11/20/15 MMSE 24/30. Failed clock drawing. Normal vibratory sensation. Stomping gait with the right foot.  Skin: No rash noted. No erythema. No pallor.   Actinic keratosis left cheek with some bleeding. Sutures of the right eyebrow. Skin tears of the right forearm.  Psychiatric: He has a normal mood and affect. His behavior is normal.    Labs reviewed:  Recent Labs  11/12/15 06/30/16 1420 07/01/16 0354  NA 136* 137 136  K 4.5 3.9 3.7  CL  --  104 109  CO2  --  26 22  GLUCOSE  --  107* 92  BUN 22* 22* 17  CREATININE 0.9 0.90 0.86  CALCIUM  --  8.7* 7.9*    Recent Labs  11/12/15 06/30/16 1420  AST 16 18  ALT 9* 12*  ALKPHOS 84 79  BILITOT  --  1.1  PROT  --  6.9  ALBUMIN  --  3.6    Recent Labs  06/30/16 1420 07/01/16 0354  WBC 12.7* 11.8*  NEUTROABS  --  8.4*  HGB 12.5* 10.8*  HCT 37.9* 32.6*  MCV 92.0 91.8  PLT 168 165   Lab Results  Component Value Date   TSH 2.68 11/12/2015   No results found for: HGBA1C Lab Results  Component Value Date   CHOL 173 11/12/2015   HDL 44 11/12/2015   LDLCALC 109 11/12/2015   TRIG 102 11/12/2015    Significant Diagnostic Results in last 30 days:  No results found.  Assessment/Plan Direct inguinal hernia R, reducible, no pain upon  my examination today, observe.   Insomnia Dc Lorazepam, try Temazepam   Dysphagia, pharyngoesophageal phase Diet as tolerated.   Osteoarthritis Continue Tylenol, prn Morphine for pain  BPH (benign prostatic hyperplasia) managed with Finasteride and Tamsulosin  Memory loss 10/07/16 Not sleep at night, Dc Ativan, no efficacy, start Temazepam 7.5mg  qhs      Family/ staff Communication:SNF  Labs/tests ordered:  none

## 2016-10-12 NOTE — Assessment & Plan Note (Signed)
10/07/16 Not sleep at night, Dc Ativan, no efficacy, start Temazepam 7.5mg  qhs

## 2016-10-12 NOTE — Assessment & Plan Note (Signed)
Dc Lorazepam, try Temazepam

## 2016-10-12 NOTE — Assessment & Plan Note (Signed)
Continue Tylenol, prn Morphine for pain

## 2016-10-12 NOTE — Assessment & Plan Note (Signed)
Diet as tolerated

## 2016-10-12 NOTE — Assessment & Plan Note (Signed)
managed with Finasteride and Tamsulosin

## 2016-10-12 NOTE — Assessment & Plan Note (Signed)
R, reducible, no pain upon my examination today, observe.  

## 2016-11-03 ENCOUNTER — Encounter: Payer: Self-pay | Admitting: Internal Medicine

## 2016-11-03 ENCOUNTER — Non-Acute Institutional Stay (SKILLED_NURSING_FACILITY): Payer: PPO | Admitting: Internal Medicine

## 2016-11-03 DIAGNOSIS — R627 Adult failure to thrive: Secondary | ICD-10-CM | POA: Diagnosis not present

## 2016-11-03 DIAGNOSIS — R29898 Other symptoms and signs involving the musculoskeletal system: Secondary | ICD-10-CM | POA: Diagnosis not present

## 2016-11-03 DIAGNOSIS — R413 Other amnesia: Secondary | ICD-10-CM | POA: Diagnosis not present

## 2016-11-03 DIAGNOSIS — R1314 Dysphagia, pharyngoesophageal phase: Secondary | ICD-10-CM | POA: Diagnosis not present

## 2016-11-03 NOTE — Progress Notes (Signed)
Location:  [facility] Room Number: N7 Place of Service:  SNF [id number]) Provider:   Jeanmarie Hubert, MD  Patient Care Team: [name], MD as PCP - General (Internal Medicine) [facility] [name], MD as Consulting Physician (Ophthalmology) [name], MD as Consulting Physician (Gastroenterology) [name], MD as Consulting Physician (Urology) [name], MD as Consulting Physician (Dermatology)  Extended Emergency Contact Information Primary Emergency Contact: Covino,Barbara Address: [address]          [place], [place] [address] [place] Phone: [phone] Relation: Spouse Secondary Emergency Contact: [place] of [place] Phone: [phone] Relation: Daughter  Code Status:  DNR Goals of care: Advanced Directive information Advanced Directives 11/03/2016  Does Patient Have a Medical Advance Directive? Yes  Type of Advance Directive Living will;Healthcare Power of [place];Out of facility DNR (pink MOST or yellow form)  Does patient want to make changes to medical advance directive? -  Copy of [place] in Chart? Yes  Pre-existing out of facility DNR order (yellow form or pink MOST form) Yellow form placed in chart (order not valid for inpatient use);Pink MOST form placed in chart (order not valid for inpatient use)     Chief Complaint  Patient presents with  . Medical Management of Chronic Issues    routine visit    HPI:  Pt is a 83 y.o. male seen today for medical management of chronic diseases.     Past Medical History:  Diagnosis Date  . Colon tumor 1985  . Elevated prostate specific antigen (PSA) 05/13/2011  . Herpes zoster 05/13/2011  . Herpes zoster with ophthalmic complication 99991111  . Hypertrophy of prostate without urinary obstruction and other lower urinary tract symptoms (LUTS) 09/20/2010  . Memory loss 03/25/2009  . Neoplasm of  uncertain behavior of prostate 05/13/2011  . Nodular prostate without urinary obstruction 05/18/2012  . Osteoarthrosis, unspecified whether generalized or localized, unspecified site 09/20/2010  . Other and unspecified hyperlipidemia 09/20/2010  . Personal history of colonic polyps 09/20/2010  . Unspecified constipation 09/24/2010  . Unspecified glaucoma(365.9) 09/20/2010   bilateral   Past Surgical History:  Procedure Laterality Date  . APPENDECTOMY  1940  . CATARACT EXTRACTION W/ INTRAOCULAR LENS IMPLANT Right 2010   Dr. Ellie Lunch  . COLECTOMY  07/17/1997   sigmoid submucosal lipoma Dr. Oletta Lamas  . COLONOSCOPY  2000   normal  . COLONOSCOPY  04/21/2002   polypectomy  . COLONOSCOPY  06/10/2004   no polyps  . SKIN CANCER EXCISION Right 05/19/2013   ear Dr. Dannette Barbara  . TONSILLECTOMY  1938  . TUMOR REMOVAL  1993   Removal of Benign Intestinal Tumor   . VASECTOMY  1972    No Known Allergies  Allergies as of 11/03/2016   No Known Allergies     Medication List       Accurate as of 11/03/16  2:31 PM. Always use your most recent med list.          acetaminophen 650 MG CR tablet Commonly known as:  TYLENOL Take 1,300 mg by mouth at bedtime.   albuterol (2.5 MG/3ML) 0.083% nebulizer solution Commonly known as:  PROVENTIL Take 3 mLs (2.5 mg total) by nebulization every 4 (four) hours as needed for wheezing or shortness of breath.   bisacodyl 10 MG suppository Commonly known as:  DULCOLAX Place 10 mg rectally. One rectal suppository once a day as needed for constipation   CENTRUM  tablet Take 1 tablet by mouth daily.   docusate sodium 100 MG capsule Commonly known as:  COLACE Take 100 mg by mouth daily as needed for mild constipation.   dorzolamide-timolol 22.3-6.8 MG/ML ophthalmic solution Commonly known as:  COSOPT Place 1 drop into both eyes 2 (two) times daily.   finasteride 5 MG tablet Commonly known as:  PROSCAR Take 5 mg by mouth. Take one tablet once daily     hydrocortisone cream 1 % Apply 1 application topically as needed for itching.   latanoprost 0.005 % ophthalmic solution Commonly known as:  XALATAN Place 1 drop into both eyes at bedtime.   morphine 20 MG/5ML solution Take 1.3 mLs (5.2 mg total) by mouth every 2 (two) hours as needed for pain.   polyethylene glycol packet Commonly known as:  MIRALAX / GLYCOLAX Take 17 g by mouth daily as needed for moderate constipation.   senna-docusate 8.6-50 MG tablet Commonly known as:  Senokot-S Take 1 tablet by mouth. Take one twice daily   tamsulosin 0.4 MG Caps capsule Commonly known as:  FLOMAX Take 0.4 mg by mouth. Take once a day   temazepam 7.5 MG capsule Commonly known as:  RESTORIL Take 7.5 mg by mouth. Take one capsule at bedtime   zinc oxide 20 % ointment Apply 1 application topically. Apply to buttocks three times daily as needed for redness       Review of Systems  Immunization History  Administered Date(s) Administered  . Influenza Whole 06/29/2012, 06/30/2013  . Influenza-Unspecified 07/10/2015, 07/10/2016  . Pneumococcal Conjugate-13 05/20/2016  . Pneumococcal Polysaccharide-23 09/30/1995  . Td 09/29/1994  . Zoster 09/30/2007   Pertinent  Health Maintenance Due  Topic Date Due  . INFLUENZA VACCINE  Completed  . PNA vac Low Risk Adult  Completed   Fall Risk  07/03/2016 05/20/2016 02/05/2016 11/20/2015 05/22/2015  Falls in the past year? Yes Yes Yes Yes Yes  Number falls in past yr: 2 or more 2 or more 2 or more 1 2 or more  Injury with Fall? Yes No Yes Yes Yes  Risk Factor Category  - High Fall Risk High Fall Risk - High Fall Risk  Risk for fall due to : - - History of fall(s);Impaired mobility - -   Functional Status Survey:    Vitals:   11/03/16 1410  BP: (!) 141/81  Pulse: 70  Resp: (!) 24  Temp: (!) 96.3 F (35.7 C)  SpO2: 97%  Weight: 159 lb 11.2 oz (72.4 kg)  Height: 5\' 8"  (1.727 m)   Body mass index is 24.28 kg/m. Physical Exam  Labs  reviewed:  Recent Labs  11/12/15 06/30/16 1420 07/01/16 0354  NA 136* 137 136  K 4.5 3.9 3.7  CL  --  104 109  CO2  --  26 22  GLUCOSE  --  107* 92  BUN 22* 22* 17  CREATININE 0.9 0.90 0.86  CALCIUM  --  8.7* 7.9*    Recent Labs  11/12/15 06/30/16 1420  AST 16 18  ALT 9* 12*  ALKPHOS 84 79  BILITOT  --  1.1  PROT  --  6.9  ALBUMIN  --  3.6    Recent Labs  06/30/16 1420 07/01/16 0354  WBC 12.7* 11.8*  NEUTROABS  --  8.4*  HGB 12.5* 10.8*  HCT 37.9* 32.6*  MCV 92.0 91.8  PLT 168 165   Lab Results  Component Value Date   TSH 2.68 11/12/2015   No results found  for: HGBA1C Lab Results  Component Value Date   CHOL 173 11/12/2015   HDL 44 11/12/2015   LDLCALC 109 11/12/2015   TRIG 102 11/12/2015    Significant Diagnostic Results in last 30 days:  No results found.  Assessment/Plan There are no diagnoses linked to this encounter.   Family/ staff Communication:  Labs/tests ordered:

## 2016-11-03 NOTE — Progress Notes (Signed)
Facility  [facility] Room Number: N7  Place of Service: SNF (31)     No Known Allergies  Chief Complaint  Patient presents with  . Medical Management of Chronic Issues    routine visit    HPI:  Memory loss - unchanged. Gradual worsening over the last year  Weakness of right leg - persistent. Interferes with safe ambulation  Dysphagia, pharyngoesophageal phase - noo recent choking episodes  Failure to thrive in adult - appears to be stabiling    Medications: Patient's Medications  New Prescriptions   No medications on file  Previous Medications   ACETAMINOPHEN (TYLENOL) 650 MG CR TABLET    Take 1,300 mg by mouth at bedtime.   ALBUTEROL (PROVENTIL) (2.5 MG/3ML) 0.083% NEBULIZER SOLUTION    Take 3 mLs (2.5 mg total) by nebulization every 4 (four) hours as needed for wheezing or shortness of breath.   BISACODYL (DULCOLAX) 10 MG SUPPOSITORY    Place 10 mg rectally. One rectal suppository once a day as needed for constipation   DOCUSATE SODIUM (COLACE) 100 MG CAPSULE    Take 100 mg by mouth daily as needed for mild constipation.    DORZOLAMIDE-TIMOLOL (COSOPT) 22.3-6.8 MG/ML OPHTHALMIC SOLUTION    Place 1 drop into both eyes 2 (two) times daily.   FINASTERIDE (PROSCAR) 5 MG TABLET    Take 5 mg by mouth. Take one tablet once daily   HYDROCORTISONE CREAM 1 %    Apply 1 application topically as needed for itching.   LATANOPROST (XALATAN) 0.005 % OPHTHALMIC SOLUTION    Place 1 drop into both eyes at bedtime.    MORPHINE 20 MG/5ML SOLUTION    Take 1.3 mLs (5.2 mg total) by mouth every 2 (two) hours as needed for pain.   MULTIPLE VITAMINS-MINERALS (CENTRUM) TABLET    Take 1 tablet by mouth daily.   POLYETHYLENE GLYCOL (MIRALAX / GLYCOLAX) PACKET    Take 17 g by mouth daily as needed for moderate constipation.   SENNA-DOCUSATE (SENOKOT-S) 8.6-50 MG TABLET    Take 1 tablet by mouth. Take one twice daily   TAMSULOSIN (FLOMAX) 0.4 MG CAPS CAPSULE    Take 0.4 mg by mouth. Take  once a day   TEMAZEPAM (RESTORIL) 7.5 MG CAPSULE    Take 7.5 mg by mouth. Take one capsule at bedtime   ZINC OXIDE 20 % OINTMENT    Apply 1 application topically. Apply to buttocks three times daily as needed for redness  Modified Medications   No medications on file  Discontinued Medications   FINASTERIDE (PROSCAR) 5 MG TABLET    Take one tablet by mouth once daily to shrink the prostate   LORAZEPAM (ATIVAN) 2 MG/ML CONCENTRATED SOLUTION    Take 1 mL (2 mg total) by mouth every 4 (four) hours as needed for anxiety or sedation (or dyspnea).   NEOMYCIN-BACITRACIN-POLYMYXIN (NEOSPORIN) [phone] OINTMENT    Apply topically 4 (four) times daily.   TAMSULOSIN (FLOMAX) 0.4 MG CAPS CAPSULE    TAKE ONE CAPSULE BY MOUTH ONCE DAILY TO HELP PROSTATE AND TO REDUCE URINARY FREQUENCY     Review of Systems  Constitutional: Positive for activity change. Negative for appetite change, chills, fatigue and fever.  HENT: Positive for hearing loss and trouble swallowing.   Eyes: Positive for visual disturbance (corrective lenses).  Respiratory: Positive for cough.   Cardiovascular: Negative for chest pain, palpitations and leg swelling.  Gastrointestinal:       Choking when swallowing. Improved with  instructions on chin tuck maneuver.  Endocrine: Negative.   Genitourinary: Positive for difficulty urinating, frequency and urgency.       Hx of nodular prostate.  Musculoskeletal: Positive for gait problem (Using walker with 2 front wheels and rear skids. Right foot dragging.).       Miild discomfort in the left posterior knee. No  Effusion. Left shoulder pain when he lays on it. History compression fracture at T12 in 2005. Increased falls in May 2016. Pain in low back and lumbosacral area.  Skin: Positive for wound (right eyebrow. Skin ters of the right forearm.).       History of herpes zoster in the ophthalmic branch that is healed. Generalized thin skin. Skin tear of the right wrist from a fall.    Neurological: Positive for dizziness and weakness. Negative for seizures, light-headedness, numbness and headaches.       Memory loss  Hematological: Negative.   Psychiatric/Behavioral: Positive for confusion, decreased concentration and sleep disturbance.    Vitals:   11/03/16 1410  BP: (!) 141/81  Pulse: 70  Resp: (!) 24  Temp: (!) 96.3 F (35.7 C)  SpO2: 97%  Weight: 159 lb 11.2 oz (72.4 kg)  Height: 5' 8"  (1.727 m)   Wt Readings from Last 3 Encounters:  11/03/16 159 lb 11.2 oz (72.4 kg)  09/30/16 158 lb 6.4 oz (71.8 kg)  08/05/16 162 lb (73.5 kg)    Body mass index is 24.28 kg/m.  Physical Exam  Constitutional: He appears well-developed and well-nourished. No distress.  HENT:  Head: Normocephalic and atraumatic.  Eyes:  Corrective lenses.  Neck: Neck supple. No JVD present. No tracheal deviation present. No thyromegaly present.  Cardiovascular: Normal rate, normal heart sounds and intact distal pulses.  Exam reveals no gallop and no friction rub.   No murmur heard. Irregular rhythm.  Pulmonary/Chest: No respiratory distress. He has no wheezes. He has no rales. He exhibits no tenderness.  Abdominal: He exhibits no distension and no mass. There is no tenderness.  Genitourinary:  Genitourinary Comments: Prostate is 2+ enlarged and nodular.  Musculoskeletal: Normal range of motion. He exhibits no edema or tenderness.  Mild discomfort in the left posterior knee. No effusion. Gait instability. Full range of motion of the left shoulder. Tender left buttock and hip. Halting gait.  Rarely using walker with 2 front wheels and rear skids. Generally gets around in wheel chair. Left foot externally deviated when walking.  Lymphadenopathy:    He has no cervical adenopathy.  Neurological: He is alert. No cranial nerve deficit. Coordination normal.  05/24/13 MMSE 28/30. Failed clock drawing. 11/20/15 MMSE 24/30. Failed clock drawing. Normal vibratory sensation. Stomping gait  with the right foot.  Skin: No rash noted. No erythema. No pallor.   Actinic keratosis left cheek with some bleeding. Possible basal cell skin ca of the right cheek  Psychiatric: He has a normal mood and affect. His behavior is normal.     Labs reviewed: Lab Summary Latest Ref Rng & Units 07/01/2016 06/30/2016 11/12/2015  Hemoglobin 13.0 - 17.0 g/dL 10.8(L) 12.5(L) (None)  Hematocrit 39.0 - 52.0 % 32.6(L) 37.9(L) (None)  White count 4.0 - 10.5 K/uL 11.8(H) 12.7(H) (None)  Platelet count 150 - 400 K/uL 165 168 (None)  Sodium 135 - 145 mmol/L 136 137 136(A)  Potassium 3.5 - 5.1 mmol/L 3.7 3.9 4.5  Calcium 8.9 - 10.3 mg/dL 7.9(L) 8.7(L) (None)  Phosphorus - (None) (None) (None)  Creatinine 0.61 - 1.24 mg/dL 0.86 0.90 0.9  AST 15 - 41 U/L (None) 18 16  Alk Phos 38 - 126 U/L (None) 79 84  Bilirubin 0.3 - 1.2 mg/dL (None) 1.1 (None)  Glucose 65 - 99 mg/dL 92 107(H) 88  Cholesterol 0 - 200 mg/dL (None) (None) 173  HDL cholesterol 35 - 70 mg/dL (None) (None) 44  Triglycerides 40 - 160 mg/dL (None) (None) 102  LDL Direct - (None) (None) (None)  LDL Calc mg/dL (None) (None) 109  Total protein 6.5 - 8.1 g/dL (None) 6.9 (None)  Albumin 3.5 - 5.0 g/dL (None) 3.6 (None)  Some recent data might be hidden   Lab Results  Component Value Date   TSH 2.68 11/12/2015   Lab Results  Component Value Date   BUN 17 07/01/2016   BUN 22 (H) 06/30/2016   BUN 22 (A) 11/12/2015   Lab Results  Component Value Date   CREATININE 0.86 07/01/2016   CREATININE 0.90 06/30/2016   CREATININE 0.9 11/12/2015    Assessment/Plan  1. Memory loss unchanged  2. Weakness of right leg unchanged  3. Dysphagia, pharyngoesophageal phase No recent choking episodes  4. Failure to thrive in adult Weight down 3# since Nov 2017. Very dependent on staff for all ADL.

## 2016-11-04 DIAGNOSIS — H2512 Age-related nuclear cataract, left eye: Secondary | ICD-10-CM | POA: Diagnosis not present

## 2016-11-04 DIAGNOSIS — H52203 Unspecified astigmatism, bilateral: Secondary | ICD-10-CM | POA: Diagnosis not present

## 2016-11-04 DIAGNOSIS — H401132 Primary open-angle glaucoma, bilateral, moderate stage: Secondary | ICD-10-CM | POA: Diagnosis not present

## 2016-11-07 ENCOUNTER — Other Ambulatory Visit: Payer: Self-pay

## 2016-11-07 DIAGNOSIS — E870 Hyperosmolality and hypernatremia: Secondary | ICD-10-CM

## 2016-11-11 ENCOUNTER — Other Ambulatory Visit: Payer: PPO

## 2016-11-18 ENCOUNTER — Encounter: Payer: Self-pay | Admitting: Internal Medicine

## 2016-11-21 ENCOUNTER — Non-Acute Institutional Stay (SKILLED_NURSING_FACILITY): Payer: PPO | Admitting: Nurse Practitioner

## 2016-11-21 DIAGNOSIS — M159 Polyosteoarthritis, unspecified: Secondary | ICD-10-CM

## 2016-11-21 DIAGNOSIS — N400 Enlarged prostate without lower urinary tract symptoms: Secondary | ICD-10-CM | POA: Diagnosis not present

## 2016-11-21 DIAGNOSIS — K4090 Unilateral inguinal hernia, without obstruction or gangrene, not specified as recurrent: Secondary | ICD-10-CM | POA: Diagnosis not present

## 2016-11-21 DIAGNOSIS — G4700 Insomnia, unspecified: Secondary | ICD-10-CM | POA: Diagnosis not present

## 2016-11-21 DIAGNOSIS — K5900 Constipation, unspecified: Secondary | ICD-10-CM | POA: Diagnosis not present

## 2016-11-21 DIAGNOSIS — M150 Primary generalized (osteo)arthritis: Secondary | ICD-10-CM

## 2016-11-21 NOTE — Assessment & Plan Note (Addendum)
Continue Morphine prn for comfort care, continue Tylenol 1300mg daily 

## 2016-11-21 NOTE — Progress Notes (Signed)
Location:  [name] Room Number: N7 Place of Service:  SNF (31) Provider:  Makelle Marrone, Manxie  NP  Jeanmarie Hubert, MD  Patient Care Team: [name], MD as PCP - General (Internal Medicine) [facility] [name], MD as Consulting Physician (Ophthalmology) [name], MD as Consulting Physician (Gastroenterology) [name], MD as Consulting Physician (Urology) [name], MD as Consulting Physician (Dermatology)  Extended Emergency Contact Information Primary Emergency Contact: Faivre,Barbara Address: [address]          [place], [place] [address] [name] Phone: [phone] Relation: Spouse Secondary Emergency Contact: [name] Phone: [phone] Relation: Daughter  Code Status:  DNR Goals of care: Advanced Directive information Advanced Directives 11/03/2016  Does Patient Have a Medical Advance Directive? Yes  Type of Advance Directive Living will;Healthcare Power of [name];Out of facility DNR (pink MOST or yellow form)  Does patient want to make changes to medical advance directive? -  Copy of [place] in Chart? Yes  Pre-existing out of facility DNR order (yellow form or pink MOST form) Yellow form placed in chart (order not valid for inpatient use);Pink MOST form placed in chart (order not valid for inpatient use)     No chief complaint on file.   HPI:  Pt is a 83 y.o. male seen today for medical management of chronic diseases.   Restless at night, better with Temazepam, noted the right direct inguinal hernia, reducible, no pain upon my examination today.   Hx of BPH, managed with Finasteride and Tamsulosin. Memory loss, progressing, not taking memory preserving meds, desires comfort measures for future plan of care, Morphine prn, Tylenol 1300mg  hs, prn MiraLax, prn Colace, prn Dulcolax, daily Senna S I hs for constipation.    Past  Medical History:  Diagnosis Date  . Colon tumor 1985  . Elevated prostate specific antigen (PSA) 05/13/2011  . Herpes zoster 05/13/2011  . Herpes zoster with ophthalmic complication [id number]  . Hypertrophy of prostate without urinary obstruction and other lower urinary tract symptoms (LUTS) 09/20/2010  . Memory loss 03/25/2009  . 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 Nodular prostate without urinary obstruction 05/18/2012  . Osteoarthrosis, unspecified whether generalized or localized, unspecified site 09/20/2010  . Other and unspecified hyperlipidemia 09/20/2010  . Personal history of colonic polyps 09/20/2010  . Unspecified constipation 09/24/2010  . Unspecified glaucoma(365.9) 09/20/2010   bilateral   Past Surgical History:  Procedure Laterality Date  . APPENDECTOMY  1940  . CATARACT EXTRACTION W/ INTRAOCULAR LENS IMPLANT Right 2010   Dr. Ellie Lunch  . COLECTOMY  07/17/1997   sigmoid submucosal lipoma Dr. Oletta Lamas  . COLONOSCOPY  2000   normal  . COLONOSCOPY  04/21/2002   polypectomy  . COLONOSCOPY  06/10/2004   no polyps  . SKIN CANCER EXCISION Right 05/19/2013   ear Dr. Dannette Barbara  . TONSILLECTOMY  1938  . TUMOR REMOVAL  1993   Removal of Benign Intestinal Tumor   . VASECTOMY  1972    No Known Allergies  Allergies as of 11/21/2016   No Known Allergies     Medication List       Accurate as of 11/21/16 11:59 PM. Always use your most recent med list.          acetaminophen 650 MG CR tablet Commonly known as:  TYLENOL Take 1,300 mg by mouth at bedtime.   albuterol (2.5 MG/3ML) 0.083% nebulizer solution  Commonly known as:  PROVENTIL Take 3 mLs (2.5 mg total) by nebulization every 4 (four) hours as needed for wheezing or shortness of breath.   bisacodyl 10 MG suppository Commonly known as:  DULCOLAX Place 10 mg rectally. One rectal suppository once a day as needed for constipation   CENTRUM tablet Take 1 tablet by mouth daily.   docusate sodium 100  MG capsule Commonly known as:  COLACE Take 100 mg by mouth daily as needed for mild constipation.   dorzolamide-timolol 22.3-6.8 MG/ML ophthalmic solution Commonly known as:  COSOPT Place 1 drop into both eyes 2 (two) times daily.   finasteride 5 MG tablet Commonly known as:  PROSCAR Take 5 mg by mouth. Take one tablet once daily   hydrocortisone cream 1 % Apply 1 application topically as needed for itching.   latanoprost 0.005 % ophthalmic solution Commonly known as:  XALATAN Place 1 drop into both eyes at bedtime.   morphine 20 MG/5ML solution Take 1.3 mLs (5.2 mg total) by mouth every 2 (two) hours as needed for pain.   polyethylene glycol packet Commonly known as:  MIRALAX / GLYCOLAX Take 17 g by mouth daily as needed for moderate constipation.   senna-docusate 8.6-50 MG tablet Commonly known as:  Senokot-S Take 1 tablet by mouth. Take one twice daily   tamsulosin 0.4 MG Caps capsule Commonly known as:  FLOMAX Take 0.4 mg by mouth. Take once a day   temazepam 7.5 MG capsule Commonly known as:  RESTORIL Take 7.5 mg by mouth. Take one capsule at bedtime   zinc oxide 20 % ointment Apply 1 application topically. Apply to buttocks three times daily as needed for redness       Review of Systems  Constitutional: Positive for activity change. Negative for appetite change, chills, fatigue and fever.  HENT: Positive for hearing loss and trouble swallowing.   Eyes: Positive for visual disturbance (corrective lenses).  Respiratory: Positive for cough.   Cardiovascular: Negative for chest pain, palpitations and leg swelling.  Gastrointestinal:       Choking when swallowing. Improved with instructions on chin tuck maneuver.  Endocrine: Negative.   Genitourinary: Positive for difficulty urinating, frequency and urgency.       Hx of nodular prostate.  Musculoskeletal: Positive for gait problem (Using walker with 2 front wheels and rear skids. Right foot dragging.).        Miild discomfort in the left posterior knee. No  Effusion. Left shoulder pain when he lays on it. History compression fracture at T12 in 2005. Increased falls in May 2016. Pain in low back and lumbosacral area.  Skin: Positive for wound (right eyebrow. Skin ters of the right forearm.).       History of herpes zoster in the ophthalmic branch that is healed. Generalized thin skin. Skin tear of the right wrist from a fall.  Neurological: Positive for weakness. Negative for dizziness, seizures, light-headedness, numbness and headaches.       Memory loss  Hematological: Negative.   Psychiatric/Behavioral: Positive for confusion, decreased concentration and sleep disturbance.       Long hx of restless at night, failed Lorazepam, Temazepam was effective, but has to be scheduled since the patient is unable to request it due to his dementia, failed prn,     Immunization History  Administered Date(s) Administered  . Influenza Whole 06/29/2012, 06/30/2013  . Influenza-Unspecified 07/10/2015, 07/10/2016  . Pneumococcal Conjugate-13 05/20/2016  . Pneumococcal Polysaccharide-23 09/30/1995  . Td 09/29/1994  . Zoster  09/30/2007   Pertinent  Health Maintenance Due  Topic Date Due  . INFLUENZA VACCINE  Completed  . PNA vac Low Risk Adult  Completed   Fall Risk  07/03/2016 05/20/2016 02/05/2016 11/20/2015 05/22/2015  Falls in the past year? Yes Yes Yes Yes Yes  Number falls in past yr: 2 or more 2 or more 2 or more 1 2 or more  Injury with Fall? Yes No Yes Yes Yes  Risk Factor Category  - High Fall Risk High Fall Risk - High Fall Risk  Risk for fall due to : - - History of fall(s);Impaired mobility - -   Functional Status Survey:    There were no vitals filed for this visit. There is no height or weight on file to calculate BMI. Physical Exam  Constitutional: He appears well-developed and well-nourished. No distress.  HENT:  Head: Normocephalic and atraumatic.  Eyes:  Corrective lenses.  Neck:  Neck supple. No JVD present. No tracheal deviation present. No thyromegaly present.  Cardiovascular: Normal rate, normal heart sounds and intact distal pulses.  Exam reveals no gallop and no friction rub.   No murmur heard. Irregular rhythm.  Pulmonary/Chest: No respiratory distress. He has no wheezes. He has no rales. He exhibits no tenderness.  Abdominal: He exhibits no distension and no mass. There is no tenderness.  Genitourinary:  Genitourinary Comments: Prostate is 2+ enlarged and nodular.  Musculoskeletal: Normal range of motion. He exhibits no edema or tenderness.  Mild discomfort in the left posterior knee. No effusion. Gait instability. Full range of motion of the left shoulder. Tender left buttock and hip. Halting gait. Using walker with 2 front wheels and rear skids. Left foot externally deviated when walking.  Lymphadenopathy:    He has no cervical adenopathy.  Neurological: He is alert. No cranial nerve deficit. Coordination normal.  05/24/13 MMSE 28/30. Failed clock drawing. 11/20/15 MMSE 24/30. Failed clock drawing. Normal vibratory sensation. Stomping gait with the right foot.  Skin: No rash noted. No erythema. No pallor.   Actinic keratosis left cheek with some bleeding. Sutures of the right eyebrow. Skin tears of the right forearm.  Psychiatric: He has a normal mood and affect. His behavior is normal.  Sleeps well with Temazepam.     Labs reviewed:  Recent Labs  06/30/16 1420 07/01/16 0354  NA 137 136  K 3.9 3.7  CL 104 109  CO2 26 22  GLUCOSE 107* 92  BUN 22* 17  CREATININE 0.90 0.86  CALCIUM 8.7* 7.9*    Recent Labs  06/30/16 1420  AST 18  ALT 12*  ALKPHOS 79  BILITOT 1.1  PROT 6.9  ALBUMIN 3.6    Recent Labs  06/30/16 1420 07/01/16 0354  WBC 12.7* 11.8*  NEUTROABS  --  8.4*  HGB 12.5* 10.8*  HCT 37.9* 32.6*  MCV 92.0 91.8  PLT 168 165   Lab Results  Component Value Date   TSH 2.68 11/12/2015   No results found for: HGBA1C Lab  Results  Component Value Date   CHOL 173 11/12/2015   HDL 44 11/12/2015   LDLCALC 109 11/12/2015   TRIG 102 11/12/2015    Significant Diagnostic Results in last 30 days:  No results found.  Assessment/Plan Direct inguinal hernia R, reducible, no pain upon my examination today, observe.   Insomnia 11/04/16 pharm change Temazepam 7.5mg  to prn Failed prn Temazepam since the patient is unable to voice his needs and cannot sleep/rest w/o it. Continue Temazepam nightly.   Osteoarthritis  Continue Morphine prn for comfort care, continue Tylenol 1300mg  daily  BPH (benign prostatic hyperplasia) No urinary retention, continue Finasteride and Tamsulosin  Constipation Stable, continue Senna S I qhs and prn MiraLax, prn [place], prn Dulcolax.      Family/ staff Communication:SNF  Labs/tests ordered:  none

## 2016-11-21 NOTE — Assessment & Plan Note (Signed)
No urinary retention, continue Finasteride and Tamsulosin 

## 2016-11-21 NOTE — Assessment & Plan Note (Signed)
11/04/16 pharm change Temazepam 7.5mg  to prn Failed prn Temazepam since the patient is unable to voice his needs and cannot sleep/rest w/o it. Continue Temazepam nightly.

## 2016-11-21 NOTE — Assessment & Plan Note (Addendum)
Stable, continue Senna S I qhs and prn MiraLax, prn Col[place], prn Dulcolax.  

## 2016-11-21 NOTE — Assessment & Plan Note (Signed)
R, reducible, no pain upon my examination today, observe.  

## 2016-12-03 DIAGNOSIS — J189 Pneumonia, unspecified organism: Secondary | ICD-10-CM | POA: Diagnosis not present

## 2016-12-03 DIAGNOSIS — M6281 Muscle weakness (generalized): Secondary | ICD-10-CM | POA: Diagnosis not present

## 2016-12-03 DIAGNOSIS — H409 Unspecified glaucoma: Secondary | ICD-10-CM | POA: Diagnosis not present

## 2016-12-03 DIAGNOSIS — M545 Low back pain: Secondary | ICD-10-CM | POA: Diagnosis not present

## 2016-12-03 DIAGNOSIS — M1990 Unspecified osteoarthritis, unspecified site: Secondary | ICD-10-CM | POA: Diagnosis not present

## 2016-12-03 DIAGNOSIS — R1314 Dysphagia, pharyngoesophageal phase: Secondary | ICD-10-CM | POA: Diagnosis not present

## 2016-12-03 DIAGNOSIS — M25551 Pain in right hip: Secondary | ICD-10-CM | POA: Diagnosis not present

## 2016-12-03 DIAGNOSIS — N401 Enlarged prostate with lower urinary tract symptoms: Secondary | ICD-10-CM | POA: Diagnosis not present

## 2016-12-03 DIAGNOSIS — R1312 Dysphagia, oropharyngeal phase: Secondary | ICD-10-CM | POA: Diagnosis not present

## 2016-12-03 DIAGNOSIS — M25552 Pain in left hip: Secondary | ICD-10-CM | POA: Diagnosis not present

## 2016-12-03 DIAGNOSIS — M25512 Pain in left shoulder: Secondary | ICD-10-CM | POA: Diagnosis not present

## 2016-12-03 DIAGNOSIS — R29898 Other symptoms and signs involving the musculoskeletal system: Secondary | ICD-10-CM | POA: Diagnosis not present

## 2016-12-03 DIAGNOSIS — N400 Enlarged prostate without lower urinary tract symptoms: Secondary | ICD-10-CM | POA: Diagnosis not present

## 2016-12-03 DIAGNOSIS — I495 Sick sinus syndrome: Secondary | ICD-10-CM | POA: Diagnosis not present

## 2016-12-03 DIAGNOSIS — R972 Elevated prostate specific antigen [PSA]: Secondary | ICD-10-CM | POA: Diagnosis not present

## 2016-12-03 DIAGNOSIS — R42 Dizziness and giddiness: Secondary | ICD-10-CM | POA: Diagnosis not present

## 2016-12-03 DIAGNOSIS — R296 Repeated falls: Secondary | ICD-10-CM | POA: Diagnosis not present

## 2016-12-03 DIAGNOSIS — C44221 Squamous cell carcinoma of skin of unspecified ear and external auricular canal: Secondary | ICD-10-CM | POA: Diagnosis not present

## 2016-12-03 DIAGNOSIS — E785 Hyperlipidemia, unspecified: Secondary | ICD-10-CM | POA: Diagnosis not present

## 2016-12-03 DIAGNOSIS — M5441 Lumbago with sciatica, right side: Secondary | ICD-10-CM | POA: Diagnosis not present

## 2016-12-03 DIAGNOSIS — R413 Other amnesia: Secondary | ICD-10-CM | POA: Diagnosis not present

## 2016-12-03 DIAGNOSIS — Z9181 History of falling: Secondary | ICD-10-CM | POA: Diagnosis not present

## 2016-12-03 DIAGNOSIS — M79605 Pain in left leg: Secondary | ICD-10-CM | POA: Diagnosis not present

## 2016-12-03 DIAGNOSIS — W19XXXA Unspecified fall, initial encounter: Secondary | ICD-10-CM | POA: Diagnosis not present

## 2016-12-29 DIAGNOSIS — J189 Pneumonia, unspecified organism: Secondary | ICD-10-CM | POA: Diagnosis not present

## 2016-12-29 DIAGNOSIS — N400 Enlarged prostate without lower urinary tract symptoms: Secondary | ICD-10-CM | POA: Diagnosis not present

## 2016-12-29 DIAGNOSIS — W19XXXA Unspecified fall, initial encounter: Secondary | ICD-10-CM | POA: Diagnosis not present

## 2016-12-29 DIAGNOSIS — M5441 Lumbago with sciatica, right side: Secondary | ICD-10-CM | POA: Diagnosis not present

## 2016-12-29 DIAGNOSIS — R1312 Dysphagia, oropharyngeal phase: Secondary | ICD-10-CM | POA: Diagnosis not present

## 2016-12-29 DIAGNOSIS — M25551 Pain in right hip: Secondary | ICD-10-CM | POA: Diagnosis not present

## 2016-12-29 DIAGNOSIS — Z7189 Other specified counseling: Secondary | ICD-10-CM | POA: Diagnosis not present

## 2016-12-29 DIAGNOSIS — N401 Enlarged prostate with lower urinary tract symptoms: Secondary | ICD-10-CM | POA: Diagnosis not present

## 2016-12-29 DIAGNOSIS — R42 Dizziness and giddiness: Secondary | ICD-10-CM | POA: Diagnosis not present

## 2016-12-29 DIAGNOSIS — M25512 Pain in left shoulder: Secondary | ICD-10-CM | POA: Diagnosis not present

## 2016-12-29 DIAGNOSIS — E785 Hyperlipidemia, unspecified: Secondary | ICD-10-CM | POA: Diagnosis not present

## 2016-12-29 DIAGNOSIS — R296 Repeated falls: Secondary | ICD-10-CM | POA: Diagnosis not present

## 2016-12-29 DIAGNOSIS — M545 Low back pain: Secondary | ICD-10-CM | POA: Diagnosis not present

## 2016-12-29 DIAGNOSIS — M6281 Muscle weakness (generalized): Secondary | ICD-10-CM | POA: Diagnosis not present

## 2016-12-29 DIAGNOSIS — M25552 Pain in left hip: Secondary | ICD-10-CM | POA: Diagnosis not present

## 2016-12-29 DIAGNOSIS — C44221 Squamous cell carcinoma of skin of unspecified ear and external auricular canal: Secondary | ICD-10-CM | POA: Diagnosis not present

## 2016-12-29 DIAGNOSIS — R413 Other amnesia: Secondary | ICD-10-CM | POA: Diagnosis not present

## 2016-12-29 DIAGNOSIS — M79605 Pain in left leg: Secondary | ICD-10-CM | POA: Diagnosis not present

## 2016-12-29 DIAGNOSIS — M1990 Unspecified osteoarthritis, unspecified site: Secondary | ICD-10-CM | POA: Diagnosis not present

## 2016-12-29 DIAGNOSIS — R972 Elevated prostate specific antigen [PSA]: Secondary | ICD-10-CM | POA: Diagnosis not present

## 2016-12-29 DIAGNOSIS — R1314 Dysphagia, pharyngoesophageal phase: Secondary | ICD-10-CM | POA: Diagnosis not present

## 2016-12-29 DIAGNOSIS — H409 Unspecified glaucoma: Secondary | ICD-10-CM | POA: Diagnosis not present

## 2016-12-29 DIAGNOSIS — Z9181 History of falling: Secondary | ICD-10-CM | POA: Diagnosis not present

## 2016-12-29 DIAGNOSIS — R29898 Other symptoms and signs involving the musculoskeletal system: Secondary | ICD-10-CM | POA: Diagnosis not present

## 2017-01-23 ENCOUNTER — Non-Acute Institutional Stay (SKILLED_NURSING_FACILITY): Payer: PPO | Admitting: Nurse Practitioner

## 2017-01-23 ENCOUNTER — Encounter: Payer: Self-pay | Admitting: Nurse Practitioner

## 2017-01-23 DIAGNOSIS — M150 Primary generalized (osteo)arthritis: Secondary | ICD-10-CM | POA: Diagnosis not present

## 2017-01-23 DIAGNOSIS — N400 Enlarged prostate without lower urinary tract symptoms: Secondary | ICD-10-CM | POA: Diagnosis not present

## 2017-01-23 DIAGNOSIS — G4700 Insomnia, unspecified: Secondary | ICD-10-CM

## 2017-01-23 DIAGNOSIS — K5900 Constipation, unspecified: Secondary | ICD-10-CM

## 2017-01-23 DIAGNOSIS — M159 Polyosteoarthritis, unspecified: Secondary | ICD-10-CM

## 2017-01-23 DIAGNOSIS — R413 Other amnesia: Secondary | ICD-10-CM

## 2017-01-23 DIAGNOSIS — K4090 Unilateral inguinal hernia, without obstruction or gangrene, not specified as recurrent: Secondary | ICD-10-CM

## 2017-01-23 NOTE — Assessment & Plan Note (Signed)
11/04/16 pharm change Temazepam 7.5mg  to prn Failed prn Temazepam since the patient is unable to voice his needs and cannot sleep/rest w/o it. Continue Temazepam nightly.  Re-evaluate in one month.

## 2017-01-23 NOTE — Assessment & Plan Note (Signed)
Continue Morphine prn for comfort care, continue Tylenol 1300mg  daily

## 2017-01-23 NOTE — Assessment & Plan Note (Signed)
R, reducible, no pain upon my examination today, observe.

## 2017-01-23 NOTE — Assessment & Plan Note (Signed)
No urinary retention, continue Finasteride and Tamsulosin

## 2017-01-23 NOTE — Progress Notes (Signed)
Location:  [place] Room Number: 7 Place of Service:  SNF (31) Provider:  Erek Kowal, Manxie  NP  Jeanmarie Hubert, MD  Patient Care Team: [name], MD as PCP - General (Internal Medicine) [facility] [name], MD as Consulting Physician (Ophthalmology) [name], MD as Consulting Physician (Gastroenterology) [name], MD as Consulting Physician (Urology) [name], MD as Consulting Physician (Dermatology)  Extended Emergency Contact Information Primary Emergency Contact: Krage,Barbara Address: [address]          [place], [place] [address] [place] Phone: [phone] Relation: Spouse Secondary Emergency Contact: [place] Phone: [phone] Relation: Daughter  Code Status:  DNR Goals of care: Advanced Directive information Advanced Directives 01/23/2017  Does Patient Have a Medical Advance Directive? Yes  Type of Paramedic of [place];Out of facility DNR (pink MOST or yellow form);Living will  Does patient want to make changes to medical advance directive? No - Patient declined  Copy of [place] in Chart? Yes  Pre-existing out of facility DNR order (yellow form or pink MOST form) Yellow form placed in chart (order not valid for inpatient use)     Chief Complaint  Patient presents with  . Medical Management of Chronic Issues    HPI:  Pt is a 83 y.o. male seen today for medical management of chronic diseases.      Restless at night, better with Temazepam, noted the right direct inguinal hernia, reducible             Hx of BPH, managed with Finasteride and Tamsulosin. Memory loss, progressing, not taking memory preserving meds, desires comfort measures for future plan of care, Morphine prn, Tylenol 1300mg  hs, prn MiraLax, prn Colace, prn Dulcolax, daily Senna S I hs for constipation.   Past Medical History:    Diagnosis Date  . Colon tumor 1985  . Elevated prostate specific antigen (PSA) 05/13/2011  . Herpes zoster 05/13/2011  . Herpes zoster with ophthalmic complication 05/02/4195  . Hypertrophy of prostate without urinary obstruction and other lower urinary tract symptoms (LUTS) 09/20/2010  . Memory loss 03/25/2009  . 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 Nodular prostate without urinary obstruction 05/18/2012  . Osteoarthrosis, unspecified whether generalized or localized, unspecified site 09/20/2010  . Other and unspecified hyperlipidemia 09/20/2010  . Personal history of colonic polyps 09/20/2010  . Unspecified constipation 09/24/2010  . Unspecified glaucoma(365.9) 09/20/2010   bilateral   Past Surgical History:  Procedure Laterality Date  . APPENDECTOMY  1940  . CATARACT EXTRACTION W/ INTRAOCULAR LENS IMPLANT Right 2010   Dr. Ellie Lunch  . COLECTOMY  07/17/1997   sigmoid submucosal lipoma Dr. Oletta Lamas  . COLONOSCOPY  2000   normal  . COLONOSCOPY  04/21/2002   polypectomy  . COLONOSCOPY  06/10/2004   no polyps  . SKIN CANCER EXCISION Right 05/19/2013   ear Dr. Dannette Barbara  . TONSILLECTOMY  1938  . TUMOR REMOVAL  1993   Removal of Benign Intestinal Tumor   . VASECTOMY  1972    No Known Allergies  Outpatient Encounter Prescriptions as of 01/23/2017  Medication Sig  . acetaminophen (TYLENOL) 650 MG CR tablet Take 1,300 mg by mouth at bedtime.  Marland Kitchen albuterol (PROVENTIL) (2.5 MG/3ML) 0.083% nebulizer solution Take 3 mLs (2.5 mg total) by nebulization every 4 (four) hours as needed for wheezing or shortness of breath.  . bisacodyl (DULCOLAX) 10 MG suppository  Place 10 mg rectally. One rectal suppository once a day as needed for constipation  . docusate sodium (COLACE) 100 MG capsule Take 100 mg by mouth daily as needed for mild constipation.   . dorzolamide-timolol (COSOPT) 22.3-6.8 MG/ML ophthalmic solution Place 1 drop into both eyes 2 (two) times daily.  . finasteride  (PROSCAR) 5 MG tablet Take 5 mg by mouth. Take one tablet once daily  . hydrocortisone cream 1 % Apply 1 application topically as needed for itching.  . latanoprost (XALATAN) 0.005 % ophthalmic solution Place 1 drop into both eyes at bedtime.   Marland Kitchen morphine 20 MG/5ML solution Take 1.3 mLs (5.2 mg total) by mouth every 2 (two) hours as needed for pain.  . Multiple Vitamins-Minerals (CENTRUM) tablet Take 1 tablet by mouth daily.  . polyethylene glycol (MIRALAX / GLYCOLAX) packet Take 17 g by mouth daily as needed for moderate constipation.  . senna-docusate (SENOKOT-S) 8.6-50 MG tablet Take 1 tablet by mouth. Take one twice daily  . tamsulosin (FLOMAX) 0.4 MG CAPS capsule Take 0.4 mg by mouth. Take once a day  . temazepam (RESTORIL) 7.5 MG capsule Take 7.5 mg by mouth. Take one capsule at bedtime  . zinc oxide 20 % ointment Apply 1 application topically. Apply to buttocks three times daily as needed for redness   No facility-administered encounter medications on file as of 01/23/2017.     Review of Systems  Constitutional: Negative for activity change, appetite change, chills, fatigue and fever.  HENT: Positive for hearing loss and trouble swallowing.   Eyes: Positive for visual disturbance (corrective lenses).  Respiratory: Positive for cough.   Cardiovascular: Negative for chest pain, palpitations and leg swelling.  Gastrointestinal:       Choking when swallowing. Improved with instructions on chin tuck maneuver.  Endocrine: Negative.   Genitourinary: Positive for difficulty urinating and frequency. Negative for urgency.       Hx of nodular prostate.  Musculoskeletal: Positive for gait problem (Using walker with 2 front wheels and rear skids. Right foot dragging.).       Miild discomfort in the left posterior knee. No  Effusion. Left shoulder pain when he lays on it. History compression fracture at T12 in 2005. Increased falls in May 2016. Pain in low back and lumbosacral area.  Skin:  Positive for wound (right eyebrow. Skin ters of the right forearm.).       History of herpes zoster in the ophthalmic branch that is healed. Generalized thin skin. Skin tear of the right wrist from a fall.  Neurological: Positive for weakness. Negative for dizziness, seizures, light-headedness, numbness and headaches.       Memory loss  Hematological: Negative.   Psychiatric/Behavioral: Positive for confusion, decreased concentration and sleep disturbance.       Long hx of restless at night, failed Lorazepam, Temazepam was effective, but has to be scheduled since the patient is unable to request it due to his dementia, failed prn,     Immunization History  Administered Date(s) Administered  . Influenza Whole 06/29/2012, 06/30/2013  . Influenza-Unspecified 07/10/2015, 07/10/2016  . Pneumococcal Conjugate-13 05/20/2016  . Pneumococcal Polysaccharide-23 09/30/1995  . Td 09/29/1994  . Zoster 09/30/2007   Pertinent  Health Maintenance Due  Topic Date Due  . INFLUENZA VACCINE  04/29/2017  . PNA vac Low Risk Adult  Completed   Fall Risk  07/03/2016 05/20/2016 02/05/2016 11/20/2015 05/22/2015  Falls in the past year? Yes Yes Yes Yes Yes  Number falls in past yr: 2  or more 2 or more 2 or more 1 2 or more  Injury with Fall? Yes No Yes Yes Yes  Risk Factor Category  - High Fall Risk High Fall Risk - High Fall Risk  Risk for fall due to : - - History of fall(s);Impaired mobility - -   Functional Status Survey:    Vitals:   01/23/17 1212  BP: 115/60  Pulse: 76  Resp: 18  Temp: 97.9 F (36.6 C)  SpO2: 95%  Weight: 158 lb 6.4 oz (71.8 kg)  Height: 5\' 8"  (1.727 m)   Body mass index is 24.08 kg/m. Physical Exam  Constitutional: He appears well-developed and well-nourished. No distress.  HENT:  Head: Normocephalic and atraumatic.  Eyes:  Corrective lenses.  Neck: Neck supple. No JVD present. No tracheal deviation present. No thyromegaly present.  Cardiovascular: Normal rate, normal  heart sounds and intact distal pulses.  Exam reveals no gallop and no friction rub.   No murmur heard. Irregular rhythm.  Pulmonary/Chest: No respiratory distress. He has no wheezes. He has no rales. He exhibits no tenderness.  Abdominal: He exhibits no distension and no mass. There is no tenderness.  Genitourinary:  Genitourinary Comments: Prostate is 2+ enlarged and nodular.  Musculoskeletal: Normal range of motion. He exhibits no edema or tenderness.  Mild discomfort in the left posterior knee. No effusion. Gait instability. Full range of motion of the left shoulder. Tender left buttock and hip. Halting gait. Using walker with 2 front wheels and rear skids. Left foot externally deviated when walking.  Lymphadenopathy:    He has no cervical adenopathy.  Neurological: He is alert. No cranial nerve deficit. Coordination normal.  05/24/13 MMSE 28/30. Failed clock drawing. 11/20/15 MMSE 24/30. Failed clock drawing. Normal vibratory sensation. Stomping gait with the right foot.  Skin: No rash noted. No erythema. No pallor.   Actinic keratosis left cheek with some bleeding. Sutures of the right eyebrow. Skin tears of the right forearm.  Psychiatric: He has a normal mood and affect. His behavior is normal.  Sleeps well with Temazepam.     Labs reviewed:  Recent Labs  06/30/16 1420 07/01/16 0354  NA 137 136  K 3.9 3.7  CL 104 109  CO2 26 22  GLUCOSE 107* 92  BUN 22* 17  CREATININE 0.90 0.86  CALCIUM 8.7* 7.9*    Recent Labs  06/30/16 1420  AST 18  ALT 12*  ALKPHOS 79  BILITOT 1.1  PROT 6.9  ALBUMIN 3.6    Recent Labs  06/30/16 1420 07/01/16 0354  WBC 12.7* 11.8*  NEUTROABS  --  8.4*  HGB 12.5* 10.8*  HCT 37.9* 32.6*  MCV 92.0 91.8  PLT 168 165   Lab Results  Component Value Date   TSH 2.68 11/12/2015   No results found for: HGBA1C Lab Results  Component Value Date   CHOL 173 11/12/2015   HDL 44 11/12/2015   LDLCALC 109 11/12/2015   TRIG 102 11/12/2015      Significant Diagnostic Results in last 30 days:  No results found.  Assessment/Plan Constipation Stable, continue Senna S I qhs and prn MiraLax, prn Colac, prn Dulcolax.   Osteoarthritis Continue Morphine prn for comfort care, continue Tylenol 1300mg  daily  BPH (benign prostatic hyperplasia) No urinary retention, continue Finasteride and Tamsulosin  Memory loss SNF for care assistance. Update CBC CMP TSH  Direct inguinal hernia R, reducible, no pain upon my examination today, observe.   Insomnia 11/04/16 pharm change Temazepam 7.5mg  to prn  Failed prn Temazepam since the patient is unable to voice his needs and cannot sleep/rest w/o it. Continue Temazepam nightly.  Re-evaluate in one month.      Family/ staff Communication: SNF  Labs/tests ordered:  CBC CMP TSH

## 2017-01-23 NOTE — Assessment & Plan Note (Signed)
Stable, continue Senna S I qhs and prn MiraLax, prn Colac, prn Dulcolax.

## 2017-01-23 NOTE — Assessment & Plan Note (Addendum)
SNF for care assistance. Update CBC CMP TSH

## 2017-01-26 DIAGNOSIS — M6281 Muscle weakness (generalized): Secondary | ICD-10-CM | POA: Diagnosis not present

## 2017-01-26 DIAGNOSIS — J189 Pneumonia, unspecified organism: Secondary | ICD-10-CM | POA: Diagnosis not present

## 2017-01-26 DIAGNOSIS — R1312 Dysphagia, oropharyngeal phase: Secondary | ICD-10-CM | POA: Diagnosis not present

## 2017-01-26 LAB — HEPATIC FUNCTION PANEL
ALT: 13 U/L (ref 10–40)
AST: 21 U/L (ref 14–40)
Alkaline Phosphatase: 84 U/L (ref 25–125)
BILIRUBIN, TOTAL: 0.6 mg/dL

## 2017-01-26 LAB — BASIC METABOLIC PANEL
BUN: 24 mg/dL — AB (ref 4–21)
Creatinine: 1.3 mg/dL (ref <=1.3)
Glucose: 99 mg/dL
Potassium: 4.8 mmol/L (ref 3.4–5.3)
Sodium: 138 mmol/L (ref 137–147)

## 2017-01-26 LAB — TSH: TSH: 13 u[IU]/mL — AB (ref <=5.90)

## 2017-01-26 LAB — CBC AND DIFFERENTIAL
HEMATOCRIT: 42 % (ref 41–53)
HEMOGLOBIN: 13.8 g/dL (ref 13.5–17.5)
Platelets: 192 10*3/uL (ref 150–399)
WBC: 8.6 10^3/mL

## 2017-01-27 ENCOUNTER — Other Ambulatory Visit: Payer: Self-pay | Admitting: *Deleted

## 2017-01-27 ENCOUNTER — Encounter: Payer: Self-pay | Admitting: Nurse Practitioner

## 2017-01-27 DIAGNOSIS — N189 Chronic kidney disease, unspecified: Secondary | ICD-10-CM | POA: Insufficient documentation

## 2017-01-29 ENCOUNTER — Encounter: Payer: Self-pay | Admitting: Internal Medicine

## 2017-01-29 ENCOUNTER — Non-Acute Institutional Stay (SKILLED_NURSING_FACILITY): Payer: PPO | Admitting: Internal Medicine

## 2017-01-29 DIAGNOSIS — R627 Adult failure to thrive: Secondary | ICD-10-CM

## 2017-01-29 DIAGNOSIS — R413 Other amnesia: Secondary | ICD-10-CM

## 2017-01-29 DIAGNOSIS — E039 Hypothyroidism, unspecified: Secondary | ICD-10-CM

## 2017-01-29 DIAGNOSIS — N181 Chronic kidney disease, stage 1: Secondary | ICD-10-CM | POA: Diagnosis not present

## 2017-01-29 HISTORY — DX: Hypothyroidism, unspecified: E03.9

## 2017-01-29 MED ORDER — LEVOTHYROXINE SODIUM 25 MCG PO TABS: ORAL_TABLET | ORAL | 11 refills | Status: DC

## 2017-01-29 NOTE — Progress Notes (Signed)
Progress Note    Location:  [place] Room Number: N7 Place of Service:  SNF [phone]) Provider:  Jeanmarie Hubert, MD  Patient Care Team: [name], MD as PCP - General (Internal Medicine) [facility] [name], MD as Consulting Physician (Ophthalmology) [name], MD as Consulting Physician (Gastroenterology) [name], MD as Consulting Physician (Urology) [name], MD as Consulting Physician (Dermatology)  Extended Emergency Contact Information Primary Emergency Contact: JoelBarbara Address: [address]          [place], [place] [address] [place] Phone: [phone] Relation: Spouse Secondary Emergency Contact: [place] Mobile Phone: [phone] Relation: Daughter  Code Status:  DNR Goals of care: Advanced Directive information Advanced Directives 01/29/2017  Does Patient Have a Medical Advance Directive? Yes  Type of Paramedic of [place];Living will;Out of facility DNR (pink MOST or yellow form)  Does patient want to make changes to medical advance directive? -  Copy of [place] in Chart? Yes  Pre-existing out of facility DNR order (yellow form or pink MOST form) Yellow form placed in chart (order not valid for inpatient use);Pink MOST form placed in chart (order not valid for inpatient use)     Chief Complaint  Patient presents with  . Medical Management of Chronic Issues    routine visit    HPI:  Pt is a 83 y.o. [name] seen today for medical management of chronic diseases.    Spends most of his time in wheelchair or bed.  Memory loss  - slow progressing  Failure to thrive in adult - seems to have stabilized  Stage 1 chronic kidney disease - stable lab     Past Medical History:  Diagnosis Date  . Colon tumor 1985  . Elevated prostate specific antigen (PSA) 05/13/2011  . Herpes zoster 05/13/2011  .  Herpes zoster with ophthalmic complication 12/31/1538  . Hypertrophy of prostate without urinary obstruction and other lower urinary tract symptoms (LUTS) 09/20/2010  . Memory loss 03/25/2009  . 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 Nodular prostate without urinary obstruction 05/18/2012  . Osteoarthrosis, unspecified whether generalized or localized, unspecified site 09/20/2010  . Other and unspecified hyperlipidemia 09/20/2010  . Personal history of colonic polyps 09/20/2010  . Unspecified constipation 09/24/2010  . Unspecified glaucoma(365.9) 09/20/2010   bilateral   Past Surgical History:  Procedure Laterality Date  . APPENDECTOMY  1940  . CATARACT EXTRACTION W/ INTRAOCULAR LENS IMPLANT Right 2010   Dr. Ellie Lunch  . COLECTOMY  07/17/1997   sigmoid submucosal lipoma Dr. Oletta Lamas  . COLONOSCOPY  2000   normal  . COLONOSCOPY  04/21/2002   polypectomy  . COLONOSCOPY  06/10/2004   no polyps  . SKIN CANCER EXCISION Right 05/19/2013   ear Dr. Dannette [name]  . TONSILLECTOMY  1938  . TUMOR REMOVAL  1993   Removal of Benign Intestinal Tumor   . VASECTOMY  1972    No Known Allergies  Outpatient Encounter Prescriptions as of 01/29/2017  Medication Sig  . acetaminophen (TYLENOL) 650 MG CR tablet Take 1,300 mg by mouth at bedtime.  Marland Kitchen albuterol (PROVENTIL) (2.5 MG/3ML) 0.083% nebulizer solution Take 3 mLs (2.5 mg total) by nebulization every 4 (four) hours as needed for wheezing or shortness of breath.  . bisacodyl (DULCOLAX) 10 MG suppository Place 10 mg rectally. One rectal suppository once a day as needed for constipation  . docusate sodium (  COLACE) 100 MG capsule Take 100 mg by mouth daily as needed for mild constipation.   . dorzolamide-timolol (COSOPT) 22.3-6.8 MG/ML ophthalmic solution Place 1 drop into both eyes 2 (two) times daily.  . finasteride (PROSCAR) 5 MG tablet Take 5 mg by mouth. Take one tablet once daily  . hydrocortisone cream 1 % Apply 1 application topically as  needed for itching.  . latanoprost (XALATAN) 0.005 % ophthalmic solution Place 1 drop into both eyes at bedtime.   Marland Kitchen morphine 20 MG/5ML solution Take by mouth. Give 0.25 ml every 2 hours as needed  . Multiple Vitamins-Minerals (CENTRUM) tablet Take 1 tablet by mouth daily.  . polyethylene glycol (MIRALAX / GLYCOLAX) packet Take 17 g by mouth daily as needed for moderate constipation.  . senna-docusate (SENOKOT-S) 8.6-50 MG tablet Take 1 tablet by mouth. Take one twice daily  . tamsulosin (FLOMAX) 0.4 MG CAPS capsule Take 0.4 mg by mouth. Take once a day  . temazepam (RESTORIL) 7.5 MG capsule Take 7.5 mg by mouth. Take one capsule at bedtime  . zinc oxide 20 % ointment Apply 1 application topically. Apply to buttocks three times daily as needed for redness  . [DISCONTINUED] morphine 20 MG/5ML solution Take 1.3 mLs (5.2 mg total) by mouth every 2 (two) hours as needed for pain. (Patient taking differently: Take 5 mg by mouth. 0.25 ml every 2 hours as needed)   No facility-administered encounter medications on file as of 01/29/2017.     Review of Systems  Constitutional: Negative for activity change, appetite change, chills, fatigue and fever.  HENT: Positive for hearing loss and trouble swallowing.   Eyes: Positive for visual disturbance (corrective lenses).  Respiratory: Positive for cough.   Cardiovascular: Negative for chest pain, palpitations and leg swelling.  Gastrointestinal:       Choking when swallowing. Improved with instructions on chin tuck maneuver.  Endocrine: Negative.   Genitourinary: Positive for difficulty urinating and frequency. Negative for urgency.       Hx of nodular prostate.  Musculoskeletal: Positive for gait problem (no longer walking. Spends time in [facility] and bed.).       Miild discomfort in the left posterior knee. No  Effusion. Left shoulder pain when he lays on it. History compression fracture at T12 in 2005. Increased falls in May 2016. Pain in low back and  lumbosacral area.  Skin: Positive for wound (right eyebrow. Skin ters of the right forearm.).       History of herpes zoster in the ophthalmic branch that is healed. Generalized thin skin. Skin tear of the right wrist from a fall.  Neurological: Positive for weakness. Negative for dizziness, seizures, light-headedness, numbness and headaches.       Memory loss  Hematological: Negative.   Psychiatric/Behavioral: Positive for confusion, decreased concentration and sleep disturbance.       Long hx of restless at night, failed Lorazepam, Temazepam was effective, but has to be scheduled since the patient is unable to request it due to his dementia, failed prn,     Immunization History  Administered Date(s) Administered  . Influenza Whole 06/29/2012, 06/30/2013  . Influenza-Unspecified 07/10/2015, 07/10/2016  . Pneumococcal Conjugate-13 05/20/2016  . Pneumococcal Polysaccharide-23 09/30/1995  . Td 09/29/1994  . Zoster 09/30/2007   Pertinent  Health Maintenance Due  Topic Date Due  . INFLUENZA VACCINE  04/29/2017  . PNA vac Low Risk Adult  Completed   Fall Risk  07/03/2016 05/20/2016 02/05/2016 11/20/2015 05/22/2015  Falls in the past year?  Yes Yes Yes Yes Yes  Number falls in past yr: 2 or more 2 or more 2 or more 1 2 or more  Injury with Fall? Yes No Yes Yes Yes  Risk Factor Category  - High Fall Risk High Fall Risk - High Fall Risk  Risk for fall due to : - - History of fall(s);Impaired mobility - -   Functional Status Survey:    Vitals:   05/03/Joel 1200  BP: 106/65  Pulse: 84  Resp: 20  Temp: 98.1 F (36.7 C)  Weight: 158 lb (71.7 kg)  Height: 5\' 8"  (1.727 m)   Body mass index is 24.02 kg/m. Physical Exam  Constitutional: He is oriented to person, place, and time. He appears well-developed and well-nourished. No distress.  HENT:  Head: Normocephalic and atraumatic.  Eyes:  Corrective lenses.  Neck: Neck supple. No JVD present. No tracheal deviation present. No thyromegaly  present.  Cardiovascular: Normal rate, normal heart sounds and intact distal pulses.  Exam reveals no gallop and no friction rub.   No murmur heard. Irregular rhythm.  Pulmonary/Chest: No respiratory distress. He has no wheezes. He has no rales. He exhibits no tenderness.  Abdominal: He exhibits no distension and no mass. There is no tenderness.  Genitourinary:  Genitourinary Comments: Hx of prostate enlargement: Prostate is 2+ enlarged and nodular.  Musculoskeletal: Normal range of motion. He exhibits no edema or tenderness.  Mild discomfort in the left posterior knee. No effusion. Gait instability. Full range of motion of the left shoulder.  Lymphadenopathy:    He has no cervical adenopathy.  Neurological: He is alert and oriented to person, place, and time. No cranial nerve deficit. Coordination normal.  05/24/13 MMSE 28/30. Failed clock drawing. Normal vibratory sensation.  Skin: No rash noted. No erythema. No pallor.  Psychiatric: He has a normal mood and affect. His behavior is normal. Judgment and thought content normal.    Labs reviewed:  Recent Labs  06/30/16 1420 07/01/16 0354 04/30/Joel  NA 137 136 138  K 3.9 3.7 4.8  CL 104 109  --   CO2 26 22  --   GLUCOSE 107* 92  --   BUN 22* 17 24*  CREATININE 0.90 0.86 1.3  CALCIUM 8.7* 7.9*  --     Recent Labs  06/30/16 1420 04/30/Joel  AST Joel 21  ALT 12* 13  ALKPHOS 79 84  BILITOT 1.1  --   PROT 6.9  --   ALBUMIN 3.6  --     Recent Labs  06/30/16 1420 07/01/16 0354 04/30/Joel  WBC 12.7* 11.8* 8.6  NEUTROABS  --  8.4*  --   HGB 12.5* 10.8* 13.8  HCT 37.9* 32.6* 42  MCV 92.0 91.8  --   PLT 168 165 192   Lab Results  Component Value Date   TSH 13.00 (A) 01/26/2017   No results found for: HGBA1C Lab Results  Component Value Date   CHOL 173 11/12/2015   HDL 44 11/12/2015   LDLCALC 109 11/12/2015   TRIG 102 11/12/2015     Assessment/Plan 1. Memory loss unchanged  2. Failure to thrive in  adult improved  3. Stage 1 chronic kidney disease stable  4. Hypothyroidism, unspecified type New problem. TSH elevated at 134 on 4/30/Joel. Add levothyroxine 25 mcg qd -TSH in 6 weeks

## 2017-03-04 ENCOUNTER — Encounter: Payer: Self-pay | Admitting: Family

## 2017-03-04 ENCOUNTER — Non-Acute Institutional Stay (SKILLED_NURSING_FACILITY): Payer: PPO | Admitting: Family

## 2017-03-04 DIAGNOSIS — R600 Localized edema: Secondary | ICD-10-CM | POA: Diagnosis not present

## 2017-03-04 DIAGNOSIS — R1314 Dysphagia, pharyngoesophageal phase: Secondary | ICD-10-CM

## 2017-03-04 DIAGNOSIS — N400 Enlarged prostate without lower urinary tract symptoms: Secondary | ICD-10-CM

## 2017-03-04 DIAGNOSIS — K5901 Slow transit constipation: Secondary | ICD-10-CM | POA: Diagnosis not present

## 2017-03-04 NOTE — Progress Notes (Addendum)
Location:  [facility] Room Number: 7 Place of Service:   SNF  Provider: Dinah Ngetich FNP-C   Blanchie Serve, MD  Patient Care Team: Blanchie Serve, MD as PCP - General (Internal Medicine) Melina Modena, [facility] [name], MD as Consulting Physician (Ophthalmology) [name], MD as Consulting Physician (Gastroenterology) [name], MD as Consulting Physician (Urology) [name], MD as Consulting Physician (Dermatology)  Extended Emergency Contact Information Primary Emergency Contact: Tlatelpa,Barbara Address: [address], [place] [address] [place] Phone: [phone] Relation: Spouse Secondary Emergency Contact: [place] Mobile Phone: [phone] Relation: Daughter  Code Status:  DNR  Goals of care: Advanced Directive information Advanced Directives 03/04/2017  Does Patient Have a Medical Advance Directive? Yes  Type of Advance Directive Out of facility DNR (pink MOST or yellow form);Living will;Healthcare Power of Attorney  Does patient want to make changes to medical advance directive? -  Copy of [place] in Chart? Yes  Pre-existing out of facility DNR order (yellow form or pink MOST form) Yellow form placed in chart (order not valid for inpatient use)     Chief Complaint  Patient presents with  . Medical Management of Chronic Issues    routine visit    HPI:  Pt is a 83 y.o. male seen today [place] for medical management of chronic diseases.He has a medical history of BPH, Squamous cell cancer of ear, CKD, Dysphagia, OA,Memory loss among other conditions. He is seen in his room today with wife at bedside. He denies any acute issues this visit. No recent fall episode or acute illness reported. He has had a weight increase 162.4 ( 01/30/2017) from 158.4 ( 12/2016). Patient's wife states has had leg edema which tends to worsen by  the end of the day but alleviated when legs are elevated.     Past Medical History:  Diagnosis Date  . Colon tumor 1985  . Elevated prostate specific antigen (PSA) 05/13/2011  . Herpes zoster 05/13/2011  . Herpes zoster with ophthalmic complication 01/02/5034  . Hypertrophy of prostate without urinary obstruction and other lower urinary tract symptoms (LUTS) 09/20/2010  . Hypothyroid 01/29/2017  . Memory loss 03/25/2009  . 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 Nodular prostate without urinary obstruction 05/18/2012  . Osteoarthrosis, unspecified whether generalized or localized, unspecified site 09/20/2010  . Other and unspecified hyperlipidemia 09/20/2010  . Personal history of colonic polyps 09/20/2010  . Unspecified constipation 09/24/2010  . Unspecified glaucoma(365.9) 09/20/2010   bilateral   Past Surgical History:  Procedure Laterality Date  . APPENDECTOMY  1940  . CATARACT EXTRACTION W/ INTRAOCULAR LENS IMPLANT Right 2010   Dr. Ellie Lunch  . COLECTOMY  07/17/1997   sigmoid submucosal lipoma Dr. Oletta Lamas  . COLONOSCOPY  2000   normal  . COLONOSCOPY  04/21/2002   polypectomy  . COLONOSCOPY  06/10/2004   no polyps  . SKIN CANCER EXCISION Right 05/19/2013   ear Dr. Dannette Barbara  . TONSILLECTOMY  1938  . TUMOR REMOVAL  1993   Removal of Benign Intestinal Tumor   . VASECTOMY  1972    No Known Allergies  Allergies as of 03/04/2017   No Known Allergies     Medication List       Accurate as of 03/04/17  5:17 PM. Always use your most recent med list.  acetaminophen 650 MG CR tablet Commonly known as:  TYLENOL Take 1,300 mg by mouth at bedtime.   albuterol (2.5 MG/3ML) 0.083% nebulizer solution Commonly known as:  PROVENTIL Take 3 mLs (2.5 mg total) by nebulization every 4 (four) hours as needed for wheezing or shortness of breath.   bisacodyl 10 MG suppository Commonly known as:  DULCOLAX Place 10 mg rectally. One rectal suppository once a day as  needed for constipation   CENTRUM tablet Take 1 tablet by mouth daily.   docusate sodium 100 MG capsule Commonly known as:  COLACE Take 100 mg by mouth daily as needed for mild constipation.   dorzolamide-timolol 22.3-6.8 MG/ML ophthalmic solution Commonly known as:  COSOPT Place 1 drop into both eyes 2 (two) times daily. 8PM and 8PM   finasteride 5 MG tablet Commonly known as:  PROSCAR Take 5 mg by mouth. Take one tablet once daily   hydrocortisone cream 1 % Apply 1 application topically as needed for itching.   latanoprost 0.005 % ophthalmic solution Commonly known as:  XALATAN Place 1 drop into both eyes at bedtime.   morphine 20 MG/5ML solution Take by mouth. Give 0.25 ml every 2 hours as needed   polyethylene glycol packet Commonly known as:  MIRALAX / GLYCOLAX Take 17 g by mouth daily as needed for moderate constipation.   senna-docusate 8.6-50 MG tablet Commonly known as:  Senokot-S Take 1 tablet by mouth. Take one twice daily   tamsulosin 0.4 MG Caps capsule Commonly known as:  FLOMAX Take 0.4 mg by mouth. Take once a day   traZODone 50 MG tablet Commonly known as:  DESYREL Take 25 mg by mouth at bedtime.   zinc oxide 20 % ointment Apply 1 application topically. Apply to buttocks three times daily as needed for redness       Review of Systems  Constitutional: Negative for activity change, appetite change, chills, fatigue and fever.  HENT: Negative for congestion, rhinorrhea, sinus pain, sinus pressure, sneezing and sore throat.   Eyes: Negative for pain, discharge, redness and itching.       Hx Glaucoma on eye drops   Respiratory: Negative for cough, chest tightness, shortness of breath and wheezing.   Cardiovascular: Positive for leg swelling. Negative for chest pain and palpitations.  Gastrointestinal: Negative for abdominal distention, abdominal pain, constipation, diarrhea, nausea and vomiting.  Endocrine: Negative.   Genitourinary: Negative for  dysuria, flank pain, frequency and urgency.  Musculoskeletal: Positive for arthralgias and gait problem.  Skin: Negative for color change, pallor, rash and wound.  Neurological: Negative for dizziness, seizures, syncope, light-headedness and headaches.  Hematological: Does not bruise/bleed easily.  Psychiatric/Behavioral: Negative for agitation, confusion, hallucinations and sleep disturbance. The patient is not nervous/anxious.     Immunization History  Administered Date(s) Administered  . Influenza Whole 06/29/2012, 06/30/2013  . Influenza-Unspecified 07/10/2015, 07/10/2016  . PPD Test 07/02/2016, 07/16/2016  . Pneumococcal Conjugate-13 05/20/2016  . Pneumococcal Polysaccharide-23 09/30/1995  . Td 09/29/1994  . Zoster 09/30/2007   Pertinent  Health Maintenance Due  Topic Date Due  . INFLUENZA VACCINE  04/29/2017  . PNA vac Low Risk Adult  Completed   Fall Risk  07/03/2016 05/20/2016 02/05/2016 11/20/2015 05/22/2015  Falls in the past year? Yes Yes Yes Yes Yes  Number falls in past yr: 2 or more 2 or more 2 or more 1 2 or more  Injury with Fall? Yes No Yes Yes Yes  Risk Factor Category  - High Fall Risk High Fall Risk -  High Fall Risk  Risk for fall due to : - - History of fall(s);Impaired mobility - -    Vitals:   03/04/17 1043  BP: 124/62  Pulse: 75  Resp: 18  Temp: 97.1 F (36.2 C)  SpO2: 95%  Weight: 158 lb (71.7 kg)  Height: 5\' 8"  (1.727 m)   Body mass index is 24.02 kg/m. Physical Exam  Constitutional: He is oriented to person, place, and time. He appears well-developed and well-nourished. No distress.  HENT:  Head: Normocephalic.  Mouth/Throat: Oropharynx is clear and moist. No oropharyngeal exudate.  Eyes: Conjunctivae and EOM are normal. Pupils are equal, round, and reactive to light. Right eye exhibits no discharge. Left eye exhibits no discharge. No scleral icterus.  Neck: Normal range of motion. No JVD present. No thyromegaly present.  Cardiovascular: Normal  rate, regular rhythm, normal heart sounds and intact distal pulses.  Exam reveals no gallop and no friction rub.   No murmur heard. Pulmonary/Chest: Effort normal and breath sounds normal. No respiratory distress. He has no wheezes. He has no rales.  Abdominal: Soft. Bowel sounds are normal. He exhibits no distension. There is no tenderness. There is no rebound and no guarding.  Musculoskeletal: He exhibits no tenderness or deformity.  Left shoulder limited ROM due to pain. Bilateral lower extremities 2+edema.   Lymphadenopathy:    He has no cervical adenopathy.  Neurological: He is oriented to person, place, and time.  Skin: Skin is warm and dry. No rash noted. No erythema. No pallor.  Psychiatric: He has a normal mood and affect.   Labs reviewed:  Recent Labs  06/30/16 1420 07/01/16 0354 01/26/17  NA 137 136 138  K 3.9 3.7 4.8  CL 104 109  --   CO2 26 22  --   GLUCOSE 107* 92  --   BUN 22* 17 24*  CREATININE 0.90 0.86 1.3  CALCIUM 8.7* 7.9*  --     Recent Labs  06/30/16 1420 01/26/17  AST 18 21  ALT 12* 13  ALKPHOS 79 84  BILITOT 1.1  --   PROT 6.9  --   ALBUMIN 3.6  --     Recent Labs  06/30/16 1420 07/01/16 0354 01/26/17  WBC 12.7* 11.8* 8.6  NEUTROABS  --  8.4*  --   HGB 12.5* 10.8* 13.8  HCT 37.9* 32.6* 42  MCV 92.0 91.8  --   PLT 168 165 192   Lab Results  Component Value Date   TSH 13.00 (A) 01/26/2017   No results found for: HGBA1C Lab Results  Component Value Date   CHOL 173 11/12/2015   HDL 44 11/12/2015   LDLCALC 109 11/12/2015   TRIG 102 11/12/2015   Assessment/Plan 1. Localized edema A 4 pound weight gain noted over one month.Exam findings negative for cough, shortness of breath, wheezing or rales. Bilateral lower extremities 2+ worse at the end of the day. Will order knee high Ted hose on in the morning and off at bedtime. Continue to monitor weight.   2. Benign prostatic hyperplasia without lower urinary tract symptoms Stable.continue  on Tamsulosin 0.4 mg capsule and finasteride 5 mg Tablet daily. Continue to monitor for lower urinary tract obstructions.    3. Slow transit constipation Current regimen effective. Continue to encourage oral intake and hydration.   4. Dysphagia No signs of aspiration on current soft diet. Continue on Aspiration precautions.   Family/ staff Communication: Reviewed plan of care with patient and facility Nurse supervisor  Labs/tests ordered: None   Dinah C Ngetich, NP

## 2017-03-11 ENCOUNTER — Encounter: Payer: Self-pay | Admitting: Family

## 2017-03-11 ENCOUNTER — Non-Acute Institutional Stay (SKILLED_NURSING_FACILITY): Payer: PPO | Admitting: Family

## 2017-03-11 DIAGNOSIS — S51012A Laceration without foreign body of left elbow, initial encounter: Secondary | ICD-10-CM | POA: Diagnosis not present

## 2017-03-11 NOTE — Progress Notes (Signed)
Location:  [facility] Room Number: 7 Place of Service:  SNF ([id number]  Provider: Samara Stankowski FNP-C   Blanchie Serve, MD  Patient Care Team: Blanchie Serve, MD as PCP - General (Internal Medicine) Melina Modena, [facility] [name], MD as Consulting Physician (Ophthalmology) [name], MD as Consulting Physician (Gastroenterology) [name], MD as Consulting Physician (Urology) [name], MD as Consulting Physician (Dermatology) Treanna Dumler, Nelda Bucks, NP as Nurse Practitioner (Family Medicine)  Extended Emergency Contact Information Primary Emergency Contact: Martelle,Barbara Address: [address]          [place], [place] [address] [place] Phone: [phone] Relation: Spouse Secondary Emergency Contact: Shattuck,Lynn  [place] Mobile Phone: [phone] Relation: Daughter  Code Status:  DNR  Goals of care: Advanced Directive information Advanced Directives 03/11/2017  Does Patient Have a Medical Advance Directive? Yes  Type of [place] of [place];Living will;Out of facility DNR (pink MOST or yellow form)  Does patient want to make changes to medical advance directive? -  Copy of [place] in Chart? Yes  Pre-existing out of facility DNR order (yellow form or pink MOST form) Yellow form placed in chart (order not valid for inpatient use)     Chief Complaint  Patient presents with  . Acute Visit    skin tear    HPI:  Pt is a 83 y.o. male seen today [facility] for evaluation of acute issue.He is seen in his room today per facility Nurse request.Facility Nurse reports patient has a skin tear to left elbow cause unknown. Patient does not recall what happened. He denies any fever, chills, fall or hitting elbow.  No recent fall episode or acute illness reported.    Past Medical History:  Diagnosis Date  . Colon tumor 1985  . Elevated prostate  specific antigen (PSA) 05/13/2011  . Herpes zoster 05/13/2011  . Herpes zoster with ophthalmic complication 05/03/276  . Hypertrophy of prostate without urinary obstruction and other lower urinary tract symptoms (LUTS) 09/20/2010  . Hypothyroid 01/29/2017  . Memory loss 03/25/2009  . 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 Nodular prostate without urinary obstruction 05/18/2012  . Osteoarthrosis, unspecified whether generalized or localized, unspecified site 09/20/2010  . Other and unspecified hyperlipidemia 09/20/2010  . Personal history of colonic polyps 09/20/2010  . Unspecified constipation 09/24/2010  . Unspecified glaucoma(365.9) 09/20/2010   bilateral   Past Surgical History:  Procedure Laterality Date  . APPENDECTOMY  1940  . CATARACT EXTRACTION W/ INTRAOCULAR LENS IMPLANT Right 2010   Dr. Ellie Lunch  . COLECTOMY  07/17/1997   sigmoid submucosal lipoma Dr. Oletta Lamas  . COLONOSCOPY  2000   normal  . COLONOSCOPY  04/21/2002   polypectomy  . COLONOSCOPY  06/10/2004   no polyps  . SKIN CANCER EXCISION Right 05/19/2013   ear Dr. Dannette Barbara  . TONSILLECTOMY  1938  . TUMOR REMOVAL  1993   Removal of Benign Intestinal Tumor   . VASECTOMY  1972    No Known Allergies  Allergies as of 03/11/2017   No Known Allergies     Medication List       Accurate as of 03/11/17 12:12 PM. Always use your most recent med list.          acetaminophen 650 MG CR tablet Commonly known as:  TYLENOL Take 1,300 mg by mouth at bedtime.   albuterol (2.5 MG/3ML) 0.083% nebulizer solution Commonly known as:  PROVENTIL Take 3 mLs (2.5 mg total) by nebulization every 4 (four) hours as needed for wheezing or shortness of breath.   bisacodyl 10 MG suppository Commonly known as:  DULCOLAX Place 10 mg rectally. One rectal suppository once a day as needed for constipation   CENTRUM tablet Take 1 tablet by mouth daily.   docusate sodium 100 MG capsule Commonly known as:  COLACE Take 100 mg  by mouth daily as needed for mild constipation.   dorzolamide-timolol 22.3-6.8 MG/ML ophthalmic solution Commonly known as:  COSOPT Place 1 drop into both eyes 2 (two) times daily. 8PM and 8PM   finasteride 5 MG tablet Commonly known as:  PROSCAR Take 5 mg by mouth. Take one tablet once daily   hydrocortisone cream 1 % Apply 1 application topically as needed for itching.   latanoprost 0.005 % ophthalmic solution Commonly known as:  XALATAN Place 1 drop into both eyes at bedtime.   morphine 20 MG/5ML solution Take by mouth. Give 0.25 ml every 2 hours as needed   polyethylene glycol packet Commonly known as:  MIRALAX / GLYCOLAX Take 17 g by mouth daily as needed for moderate constipation.   senna-docusate 8.6-50 MG tablet Commonly known as:  Senokot-S Take 1 tablet by mouth. Take one twice daily   tamsulosin 0.4 MG Caps capsule Commonly known as:  FLOMAX Take 0.4 mg by mouth. Take once a day   traZODone 50 MG tablet Commonly known as:  DESYREL Take 25 mg by mouth at bedtime.   zinc oxide 20 % ointment Apply 1 application topically. Apply to buttocks three times daily as needed for redness       Review of Systems  Constitutional: Negative for activity change, appetite change, chills, fatigue and fever.  HENT: Negative for congestion, rhinorrhea, sinus pain, sinus pressure, sneezing and sore throat.   Eyes: Negative for pain, discharge, redness and itching.       Hx Glaucoma on eye drops   Respiratory: Negative for cough, chest tightness, shortness of breath and wheezing.   Cardiovascular: Positive for leg swelling. Negative for chest pain and palpitations.  Gastrointestinal: Negative for abdominal distention, abdominal pain, constipation, diarrhea, nausea and vomiting.  Genitourinary: Negative for dysuria, flank pain, frequency and urgency.  Musculoskeletal: Positive for arthralgias and gait problem.  Skin: Negative for color change, pallor, rash and wound.        Skin tear   Hematological: Does not bruise/bleed easily.  Psychiatric/Behavioral: Negative for agitation, confusion, hallucinations and sleep disturbance. The patient is not nervous/anxious.     Immunization History  Administered Date(s) Administered  . Influenza Whole 06/29/2012, 06/30/2013  . Influenza-Unspecified 07/10/2015, 07/10/2016  . PPD Test 07/02/2016, 07/16/2016  . Pneumococcal Conjugate-13 05/20/2016  . Pneumococcal Polysaccharide-23 09/30/1995  . Td 09/29/1994  . Zoster 09/30/2007   Pertinent  Health Maintenance Due  Topic Date Due  . INFLUENZA VACCINE  04/29/2017  . PNA vac Low Risk Adult  Completed   Fall Risk  07/03/2016 05/20/2016 02/05/2016 11/20/2015 05/22/2015  Falls in the past year? Yes Yes Yes Yes Yes  Number falls in past yr: 2 or more 2 or more 2 or more 1 2 or more  Injury with Fall? Yes No Yes Yes Yes  Risk Factor Category  - High Fall Risk High Fall Risk - High Fall Risk  Risk for fall due to : - - History of fall(s);Impaired mobility - -    Vitals:   03/11/17 0945  BP: 137/62  Pulse: 80  Resp: (!) 21  Temp: (!) 96.9 F (36.1 C)  SpO2: 90%  Weight: 161 lb 1.6 oz (73.1 kg)  Height: 5\' 8"  (1.727 m)   Body mass index is 24.5 kg/m. Physical Exam  Constitutional: He is oriented to person, place, and time. He appears well-developed and well-nourished. No distress.  HENT:  Head: Normocephalic.  Mouth/Throat: Oropharynx is clear and moist. No oropharyngeal exudate.  Eyes: Conjunctivae and EOM are normal. Pupils are equal, round, and reactive to light. Right eye exhibits no discharge. Left eye exhibits no discharge. No scleral icterus.  Neck: Normal range of motion. No JVD present. No thyromegaly present.  Cardiovascular: Normal rate, regular rhythm, normal heart sounds and intact distal pulses.  Exam reveals no gallop and no friction rub.   No murmur heard. Pulmonary/Chest: Effort normal and breath sounds normal. No respiratory distress. He has no  wheezes. He has no rales.  Abdominal: Soft. Bowel sounds are normal. He exhibits no distension. There is no tenderness. There is no rebound and no guarding.  Lymphadenopathy:    He has no cervical adenopathy.  Neurological: He is oriented to person, place, and time.  Skin: Skin is warm and dry. No rash noted. No erythema. No pallor.  Left elbow skin tear well approximated. Steri strips dry with old dry blood noted. Surrounding skin tissue without any signs of infections.   Psychiatric: He has a normal mood and affect.   Labs reviewed:  Recent Labs  06/30/16 1420 07/01/16 0354 01/26/17  NA 137 136 138  K 3.9 3.7 4.8  CL 104 109  --   CO2 26 22  --   GLUCOSE 107* 92  --   BUN 22* 17 24*  CREATININE 0.90 0.86 1.3  CALCIUM 8.7* 7.9*  --     Recent Labs  06/30/16 1420 01/26/17  AST 18 21  ALT 12* 13  ALKPHOS 79 84  BILITOT 1.1  --   PROT 6.9  --   ALBUMIN 3.6  --     Recent Labs  06/30/16 1420 07/01/16 0354 01/26/17  WBC 12.7* 11.8* 8.6  NEUTROABS  --  8.4*  --   HGB 12.5* 10.8* 13.8  HCT 37.9* 32.6* 42  MCV 92.0 91.8  --   PLT 168 165 192   Lab Results  Component Value Date   TSH 13.00 (A) 01/26/2017   No results found for: HGBA1C Lab Results  Component Value Date   CHOL 173 11/12/2015   HDL 44 11/12/2015   LDLCALC 109 11/12/2015   TRIG 102 11/12/2015   Assessment/Plan   Left Elbow skin tear  Unknown cause.No bleeding noted.Skin tear well approximate with steri strips intact. Old dry blood noted on non stick gauze.Continue to change dressing daily. Continue to monitor site for any signs of infections.   Family/ staff Communication: Reviewed plan of care with patient and facility Nurse supervisor   Labs/tests ordered: None   Sandrea Hughs, NP

## 2017-03-19 DIAGNOSIS — M6281 Muscle weakness (generalized): Secondary | ICD-10-CM | POA: Diagnosis not present

## 2017-03-19 LAB — TSH: TSH: 1.83 (ref 0.41–5.90)

## 2017-04-06 ENCOUNTER — Non-Acute Institutional Stay (SKILLED_NURSING_FACILITY): Payer: PPO | Admitting: Internal Medicine

## 2017-04-06 ENCOUNTER — Encounter: Payer: Self-pay | Admitting: Internal Medicine

## 2017-04-06 DIAGNOSIS — L03011 Cellulitis of right finger: Secondary | ICD-10-CM | POA: Diagnosis not present

## 2017-04-06 DIAGNOSIS — Z79899 Other long term (current) drug therapy: Secondary | ICD-10-CM | POA: Diagnosis not present

## 2017-04-06 DIAGNOSIS — K5901 Slow transit constipation: Secondary | ICD-10-CM

## 2017-04-06 DIAGNOSIS — F0390 Unspecified dementia without behavioral disturbance: Secondary | ICD-10-CM | POA: Diagnosis not present

## 2017-04-06 DIAGNOSIS — H409 Unspecified glaucoma: Secondary | ICD-10-CM | POA: Diagnosis not present

## 2017-04-06 DIAGNOSIS — G4700 Insomnia, unspecified: Secondary | ICD-10-CM

## 2017-04-06 DIAGNOSIS — N181 Chronic kidney disease, stage 1: Secondary | ICD-10-CM | POA: Diagnosis not present

## 2017-04-06 NOTE — Progress Notes (Signed)
Location:  [facility] Room Number: 7 Place of Service:  SNF [phone]) Provider:  Blanchie Serve MD  Blanchie Serve, MD  Patient Care Team: Blanchie Serve, MD as PCP - General (Internal Medicine) Melina Modena, [facility] [name], MD as Consulting Physician (Ophthalmology) [name], MD as Consulting Physician (Gastroenterology) [name], MD as Consulting Physician (Urology) [name], MD as Consulting Physician (Dermatology) Ngetich, Nelda Bucks, NP as Nurse Practitioner [facility] Medicine)  Extended Emergency Contact Information Primary Emergency Contact: Pichon,Barbara Address: [address]          [place], [place] [address] [place] Phone: [phone] Relation: Spouse Secondary Emergency Contact: Winnifred Friar [place] Mobile Phone: [phone] Relation: Daughter  Code Status:  DNR Goals of care: Advanced Directive information Advanced Directives 03/11/2017  Does Patient Have a Medical Advance Directive? Yes  Type of Paramedic of [place];Living will;Out of facility DNR (pink MOST or yellow form)  Does patient want to make changes to medical advance directive? -  Copy of [place] in Chart? Yes  Pre-existing out of facility DNR order (yellow form or pink MOST form) Yellow form placed in chart (order not valid for inpatient use)     Chief Complaint  Patient presents with  . Medical Management of Chronic Issues    Routine Visit     HPI:  Pt is a 83 y.o. male seen today for medical management of chronic diseases.  He is seen in his room today. He has had injury to right thumb nail bed x 2 days. He has dementia and needs increased care/ supervision with his ADLs. No fall reported. No behavioral issue reported by nursing. He is followed by SLP team. His wife is at his bedside today.    Past Medical History:  Diagnosis Date  . Colon tumor 1985  .  Elevated prostate specific antigen (PSA) 05/13/2011  . Herpes zoster 05/13/2011  . Herpes zoster with ophthalmic complication 11/04/3333  . Hypertrophy of prostate without urinary obstruction and other lower urinary tract symptoms (LUTS) 09/20/2010  . Hypothyroid 01/29/2017  . Memory loss 03/25/2009  . 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 Nodular prostate without urinary obstruction 05/18/2012  . Osteoarthrosis, unspecified whether generalized or localized, unspecified site 09/20/2010  . Other and unspecified hyperlipidemia 09/20/2010  . Personal history of colonic polyps 09/20/2010  . Unspecified constipation 09/24/2010  . Unspecified glaucoma(365.9) 09/20/2010   bilateral   Past Surgical History:  Procedure Laterality Date  . APPENDECTOMY  1940  . CATARACT EXTRACTION W/ INTRAOCULAR LENS IMPLANT Right 2010   Dr. Ellie Lunch  . COLECTOMY  07/17/1997   sigmoid submucosal lipoma Dr. Oletta Lamas  . COLONOSCOPY  2000   normal  . COLONOSCOPY  04/21/2002   polypectomy  . COLONOSCOPY  06/10/2004   no polyps  . SKIN CANCER EXCISION Right 05/19/2013   ear Dr. Dannette Barbara  . TONSILLECTOMY  1938  . TUMOR REMOVAL  1993   Removal of Benign Intestinal Tumor   . VASECTOMY  1972    No Known Allergies  Outpatient Encounter Prescriptions as of 04/06/2017  Medication Sig  . acetaminophen (TYLENOL) 650 MG CR tablet Take 1,300 mg by mouth at bedtime.  Marland Kitchen albuterol (PROVENTIL) (2.5 MG/3ML) 0.083% nebulizer solution Take 3 mLs (2.5 mg total) by nebulization every 4 (four) hours as needed for wheezing or shortness of breath.  . bisacodyl (DULCOLAX) 10 MG suppository Place 10 mg rectally every  other day. As needed  . docusate sodium (COLACE) 100 MG capsule Take 100 mg by mouth daily as needed for mild constipation.   . dorzolamide-timolol (COSOPT) 22.3-6.8 MG/ML ophthalmic solution Place 1 drop into both eyes 2 (two) times daily. 8PM and 8PM  . finasteride (PROSCAR) 5 MG tablet Take 5 mg by mouth daily.    . hydrocortisone cream 1 % Apply 1 application topically as needed for itching.  . latanoprost (XALATAN) 0.005 % ophthalmic solution Place 1 drop into both eyes at bedtime.   Marland Kitchen morphine 20 MG/5ML solution Take by mouth. Give 0.25 ml every 2 hours as needed  . Multiple Vitamins-Minerals (CENTRUM) tablet Take 1 tablet by mouth daily.  . polyethylene glycol (MIRALAX / GLYCOLAX) packet Take 17 g by mouth daily as needed for moderate constipation.  . senna-docusate (SENOKOT-S) 8.6-50 MG tablet Take 1 tablet by mouth 2 (two) times daily.   . tamsulosin (FLOMAX) 0.4 MG CAPS capsule Take 0.4 mg by mouth daily.   . traZODone (DESYREL) 50 MG tablet Take 25 mg by mouth at bedtime.  Marland Kitchen zinc oxide 20 % ointment Apply 1 application topically 3 (three) times daily as needed. Apply to buttocks   No facility-administered encounter medications on file as of 04/06/2017.     Review of Systems  Constitutional: Negative for appetite change, chills and fever.  HENT: Positive for trouble swallowing and voice change. Negative for congestion, mouth sores, rhinorrhea and sore throat.   Eyes:       History of glaucoma  Respiratory: Negative for cough, shortness of breath and wheezing.   Cardiovascular: Positive for leg swelling. Negative for chest pain and palpitations.  Gastrointestinal: Negative for abdominal pain, constipation, diarrhea, nausea and vomiting.  Genitourinary: Negative for dysuria.  Musculoskeletal: Positive for arthralgias.       Chronic leg edema  Skin: Negative for rash.  Neurological: Negative for dizziness, tremors, numbness and headaches.  Psychiatric/Behavioral: Positive for confusion. Negative for behavioral problems.    Immunization History  Administered Date(s) Administered  . Influenza Whole 06/29/2012, 06/30/2013  . Influenza-Unspecified 07/10/2015, 07/10/2016  . PPD Test 07/02/2016, 07/16/2016  . Pneumococcal Conjugate-13 05/20/2016  . Pneumococcal Polysaccharide-23 09/30/1995    . Td 09/29/1994  . Zoster 09/30/2007   Pertinent  Health Maintenance Due  Topic Date Due  . INFLUENZA VACCINE  04/29/2017  . PNA vac Low Risk Adult  Completed   Fall Risk  07/03/2016 05/20/2016 02/05/2016 11/20/2015 05/22/2015  Falls in the past year? Yes Yes Yes Yes Yes  Number falls in past yr: 2 or more 2 or more 2 or more 1 2 or more  Injury with Fall? Yes No Yes Yes Yes  Risk Factor Category  - High Fall Risk High Fall Risk - High Fall Risk  Risk for fall due to : - - History of fall(s);Impaired mobility - -   Functional Status Survey:    Vitals:   04/06/17 1530  BP: 133/80  Pulse: 72  Resp: 16  Temp: (!) 97.3 F (36.3 C)  TempSrc: Oral  SpO2: 98%  Weight: 162 lb 14.4 oz (73.9 kg)  Height: 5\' 8"  (1.727 m)   Body mass index is 24.77 kg/m. Physical Exam  Constitutional: He appears well-developed and well-nourished. No distress.  HENT:  Head: Normocephalic and atraumatic.  Mouth/Throat: Oropharynx is clear and moist.  Eyes: Pupils are equal, round, and reactive to light.  Has corrective glasses  Neck: Normal range of motion. Neck supple.  Cardiovascular: Normal rate and regular  rhythm.   Pulmonary/Chest: Effort normal and breath sounds normal. No respiratory distress. He has no wheezes.  Abdominal: Soft. Bowel sounds are normal. There is no tenderness. There is no guarding.  Musculoskeletal: Normal range of motion. He exhibits edema.  On wheelchair and has to be wheeled, trace leg edema, can move all 4 extremities, limited left shoulder ROM  Lymphadenopathy:    He has no cervical adenopathy.  Neurological: He is alert.  Oriented to self and place but not to time and person  Skin: Skin is warm and dry. He is not diaphoretic.  Right thumb nail bed has erythema with some seperation on skin from nail bed noted, no drainage, non tender, good capillary refill  Psychiatric: He has a normal mood and affect.    Labs reviewed:  Recent Labs  06/30/16 1420 07/01/16 0354  01/26/17  NA 137 136 138  K 3.9 3.7 4.8  CL 104 109  --   CO2 26 22  --   GLUCOSE 107* 92  --   BUN 22* 17 24*  CREATININE 0.90 0.86 1.3  CALCIUM 8.7* 7.9*  --     Recent Labs  06/30/16 1420 01/26/17  AST 18 21  ALT 12* 13  ALKPHOS 79 84  BILITOT 1.1  --   PROT 6.9  --   ALBUMIN 3.6  --     Recent Labs  06/30/16 1420 07/01/16 0354 01/26/17  WBC 12.7* 11.8* 8.6  NEUTROABS  --  8.4*  --   HGB 12.5* 10.8* 13.8  HCT 37.9* 32.6* 42  MCV 92.0 91.8  --   PLT 168 165 192   Lab Results  Component Value Date   TSH 1.83 03/19/2017   No results found for: HGBA1C Lab Results  Component Value Date   CHOL 173 11/12/2015   HDL 44 11/12/2015   LDLCALC 109 11/12/2015   TRIG 102 11/12/2015    Significant Diagnostic Results in last 30 days:  No results found.  Assessment/Plan  Nail bed infection Superficial nail bed infection, no purulent drainage at present, clean area and apply triple antibiotic ointment daily for now, reassess if no improvement   Medication review On prn morphine, was placed on it several months back with his pneumonia and dyspnea, breathing currently stable, d/c morphine  Glaucoma Continue his cosopt and latanoprost for now  ckd Monitor renal function periodically  Dementia Supportive care, fall precautions, pressure ulcer prophylaxis  Insomnia Continue trazodone and monitor  Chronic constipation Continue senna s daily and prn miralax. D/c colace. Maintain hydration for now  Family/ staff Communication: reviewed care plan with patient, his wife and charge nurse.    Labs/tests ordered:  none   Blanchie Serve, MD Internal Medicine [facility] [address] [place], [place] [address] Cell Phone (Monday-Friday 8 am - 5 pm): [phone] On Call: [phone] and follow prompts after 5 pm and on weekends Office Phone: [phone] Office Fax: [phone]

## 2017-04-20 ENCOUNTER — Encounter: Payer: Self-pay | Admitting: Internal Medicine

## 2017-04-20 ENCOUNTER — Non-Acute Institutional Stay (SKILLED_NURSING_FACILITY): Payer: PPO | Admitting: Internal Medicine

## 2017-04-20 DIAGNOSIS — S61511A Laceration without foreign body of right wrist, initial encounter: Secondary | ICD-10-CM | POA: Diagnosis not present

## 2017-04-20 DIAGNOSIS — F0390 Unspecified dementia without behavioral disturbance: Secondary | ICD-10-CM | POA: Insufficient documentation

## 2017-04-20 NOTE — Progress Notes (Signed)
Location:  [facility] Room Number: 7 Place of Service:  SNF [phone]) Provider:  Blanchie Serve MD  Blanchie Serve, MD  Patient Care Team: Blanchie Serve, MD as PCP - General (Internal Medicine) Melina Modena, [facility] [name], MD as Consulting Physician (Ophthalmology) [name], MD as Consulting Physician (Gastroenterology) [name], MD as Consulting Physician (Urology) [name], MD as Consulting Physician (Dermatology) Ngetich, Nelda Bucks, NP as Nurse Practitioner [facility] Medicine)  Extended Emergency Contact Information Primary Emergency Contact: Moskowitz,Barbara Address: [address]          [place], [place] [address] [place] Phone: [phone] Relation: Spouse Secondary Emergency Contact: Winnifred Friar [place] Mobile Phone: [phone] Relation: Daughter  Code Status:  DNR Goals of care: Advanced Directive information Advanced Directives 03/11/2017  Does Patient Have a Medical Advance Directive? Yes  Type of Paramedic of [place];Living will;Out of facility DNR (pink MOST or yellow form)  Does patient want to make changes to medical advance directive? -  Copy of [place] in Chart? Yes  Pre-existing out of facility DNR order (yellow form or pink MOST form) Yellow form placed in chart (order not valid for inpatient use)     Chief Complaint  Patient presents with  . Acute Visit    Skin tear to right wrist     HPI:  Pt is a 83 y.o. male seen today for acute visit.  He sustained a skin tear to right wrist on 04/18/17. Pt is seen in his room. He does not recall what happened. He denies any pain to his wrist area. No witnessed fall or injury reported. He had a skin tear to left elbow a month back on chart review. He tends to bruise easily.    Past Medical History:  Diagnosis Date  . Colon tumor 1985  . Elevated prostate specific antigen (PSA)  05/13/2011  . Herpes zoster 05/13/2011  . Herpes zoster with ophthalmic complication 12/31/9673  . Hypertrophy of prostate without urinary obstruction and other lower urinary tract symptoms (LUTS) 09/20/2010  . Hypothyroid 01/29/2017  . Memory loss 03/25/2009  . 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 Nodular prostate without urinary obstruction 05/18/2012  . Osteoarthrosis, unspecified whether generalized or localized, unspecified site 09/20/2010  . Other and unspecified hyperlipidemia 09/20/2010  . Personal history of colonic polyps 09/20/2010  . Unspecified constipation 09/24/2010  . Unspecified glaucoma(365.9) 09/20/2010   bilateral   Past Surgical History:  Procedure Laterality Date  . APPENDECTOMY  1940  . CATARACT EXTRACTION W/ INTRAOCULAR LENS IMPLANT Right 2010   Dr. Ellie Lunch  . COLECTOMY  07/17/1997   sigmoid submucosal lipoma Dr. Oletta Lamas  . COLONOSCOPY  2000   normal  . COLONOSCOPY  04/21/2002   polypectomy  . COLONOSCOPY  06/10/2004   no polyps  . SKIN CANCER EXCISION Right 05/19/2013   ear Dr. Dannette Barbara  . TONSILLECTOMY  1938  . TUMOR REMOVAL  1993   Removal of Benign Intestinal Tumor   . VASECTOMY  1972    No Known Allergies  Outpatient Encounter Prescriptions as of 04/20/2017  Medication Sig  . acetaminophen (TYLENOL) 650 MG CR tablet Take 1,300 mg by mouth at bedtime.  Marland Kitchen albuterol (PROVENTIL) (2.5 MG/3ML) 0.083% nebulizer solution Take 3 mLs (2.5 mg total) by nebulization every 4 (four) hours as needed for wheezing or shortness of breath.  . bisacodyl (DULCOLAX) 10 MG suppository Place 10 mg rectally every  other day. As needed  . dorzolamide-timolol (COSOPT) 22.3-6.8 MG/ML ophthalmic solution Place 1 drop into both eyes 2 (two) times daily. 8PM and 8PM  . finasteride (PROSCAR) 5 MG tablet Take 5 mg by mouth daily.   . hydrocortisone cream 1 % Apply 1 application topically as needed for itching.  . latanoprost (XALATAN) 0.005 % ophthalmic solution Place 1  drop into both eyes at bedtime.   . Multiple Vitamins-Minerals (CENTRUM) tablet Take 1 tablet by mouth daily.  . polyethylene glycol (MIRALAX / GLYCOLAX) packet Take 17 g by mouth daily as needed for moderate constipation.  . senna-docusate (SENOKOT-S) 8.6-50 MG tablet Take 1 tablet by mouth 2 (two) times daily.   . tamsulosin (FLOMAX) 0.4 MG CAPS capsule Take 0.4 mg by mouth daily.   . traZODone (DESYREL) 50 MG tablet Take 25 mg by mouth at bedtime.  Marland Kitchen zinc oxide 20 % ointment Apply 1 application topically 3 (three) times daily as needed. Apply to buttocks  . [DISCONTINUED] docusate sodium (COLACE) 100 MG capsule Take 100 mg by mouth daily as needed for mild constipation.   . [DISCONTINUED] morphine 20 MG/5ML solution Take by mouth. Give 0.25 ml every 2 hours as needed   No facility-administered encounter medications on file as of 04/20/2017.     Review of Systems  Constitutional: Negative for chills and fever.  HENT: Positive for trouble swallowing and voice change. Negative for congestion and sore throat.   Eyes:       History of glaucoma  Respiratory: Negative for cough and shortness of breath.   Cardiovascular: Positive for leg swelling. Negative for chest pain.  Gastrointestinal: Negative for abdominal pain, nausea and vomiting.  Genitourinary: Negative for dysuria.  Musculoskeletal: Positive for arthralgias.       Chronic leg edema  Skin: Negative for rash.  Neurological: Negative for dizziness, tremors, numbness and headaches.  Psychiatric/Behavioral: Positive for confusion. Negative for behavioral problems.    Immunization History  Administered Date(s) Administered  . Influenza Whole 06/29/2012, 06/30/2013  . Influenza-Unspecified 07/10/2015, 07/10/2016  . PPD Test 07/02/2016, 07/16/2016  . Pneumococcal Conjugate-13 05/20/2016  . Pneumococcal Polysaccharide-23 09/30/1995  . Td 09/29/1994  . Zoster 09/30/2007   Pertinent  Health Maintenance Due  Topic Date Due  .  INFLUENZA VACCINE  04/29/2017  . PNA vac Low Risk Adult  Completed   Fall Risk  07/03/2016 05/20/2016 02/05/2016 11/20/2015 05/22/2015  Falls in the past year? Yes Yes Yes Yes Yes  Number falls in past yr: 2 or more 2 or more 2 or more 1 2 or more  Injury with Fall? Yes No Yes Yes Yes  Risk Factor Category  - High Fall Risk High Fall Risk - High Fall Risk  Risk for fall due to : - - History of fall(s);Impaired mobility - -   Functional Status Survey:    Vitals:   04/20/17 1028  BP: 127/65  Pulse: 61  Resp: 20  Temp: (!) 97.5 F (36.4 C)  TempSrc: Oral  SpO2: 100%  Weight: 162 lb 14.4 oz (73.9 kg)  Height: 5\' 8"  (1.727 m)   Body mass index is 24.77 kg/m. Physical Exam  Constitutional: He appears well-developed and well-nourished. No distress.  HENT:  Head: Normocephalic and atraumatic.  Mouth/Throat: Oropharynx is clear and moist.  Eyes: Pupils are equal, round, and reactive to light.  Has corrective glasses  Neck: Normal range of motion. Neck supple.  Cardiovascular: Normal rate and regular rhythm.   Pulmonary/Chest: Effort normal and breath sounds  normal.  Abdominal: Soft. Bowel sounds are normal.  Musculoskeletal: Normal range of motion. He exhibits edema.  On wheelchair and has to be wheeled, trace leg edema, can move all 4 extremities, limited left shoulder ROM  Lymphadenopathy:    He has no cervical adenopathy.  Neurological: He is alert.  Oriented to self and place but not to time and person  Skin: Skin is warm and dry. He is not diaphoretic.  Right wrist skin tear closing well, mild erythema to area, no signs of infection, bruise to right wrist  Psychiatric: He has a normal mood and affect.    Labs reviewed:  Recent Labs  06/30/16 1420 07/01/16 0354 01/26/17  NA 137 136 138  K 3.9 3.7 4.8  CL 104 109  --   CO2 26 22  --   GLUCOSE 107* 92  --   BUN 22* 17 24*  CREATININE 0.90 0.86 1.3  CALCIUM 8.7* 7.9*  --     Recent Labs  06/30/16 1420 01/26/17    AST 18 21  ALT 12* 13  ALKPHOS 79 84  BILITOT 1.1  --   PROT 6.9  --   ALBUMIN 3.6  --     Recent Labs  06/30/16 1420 07/01/16 0354 01/26/17  WBC 12.7* 11.8* 8.6  NEUTROABS  --  8.4*  --   HGB 12.5* 10.8* 13.8  HCT 37.9* 32.6* 42  MCV 92.0 91.8  --   PLT 168 165 192   Lab Results  Component Value Date   TSH 1.83 03/19/2017   No results found for: HGBA1C Lab Results  Component Value Date   CHOL 173 11/12/2015   HDL 44 11/12/2015   LDLCALC 109 11/12/2015   TRIG 102 11/12/2015    Significant Diagnostic Results in last 30 days:  No results found.  Assessment/Plan  Right wrist skin tear Unknown cause no bleed. likely trauma from poor safety awareness and he tends to bruise easily and has thin skin. With his history of skin tear in past, will provide geri sleeve to help prevent skin tear. Provide skin care. This tear is almost resolved. Monitor for now.   Dementia Supportive care, fall precautions, pressure ulcer prophylaxis   Family/ staff Communication: reviewed care plan with patient and charge nurse.    Labs/tests ordered:  none   Blanchie Serve, MD Internal Medicine [facility] [address] [place], [place] [address] Cell Phone (Monday-Friday 8 am - 5 pm): [phone] On Call: [phone] and follow prompts after 5 pm and on weekends Office Phone: [phone] Office Fax: [phone]

## 2017-04-24 ENCOUNTER — Encounter: Payer: Self-pay | Admitting: Family

## 2017-04-24 ENCOUNTER — Non-Acute Institutional Stay (SKILLED_NURSING_FACILITY): Payer: PPO | Admitting: Family

## 2017-04-24 DIAGNOSIS — F0390 Unspecified dementia without behavioral disturbance: Secondary | ICD-10-CM

## 2017-04-24 NOTE — Progress Notes (Signed)
Location:  [facility] Room Number: 7 Place of Service:  SNF (31) Provider: Lacora Folmer FNP-C  Blanchie Serve, MD  Patient Care Team: Blanchie Serve, MD as PCP - General (Internal Medicine) Melina Modena, [facility] [name], MD as Consulting Physician (Ophthalmology) [name], MD as Consulting Physician (Gastroenterology) [name], MD as Consulting Physician (Urology) [name], MD as Consulting Physician (Dermatology) Ekaterina Denise, Nelda Bucks, NP as Nurse Practitioner (Family Medicine)  Extended Emergency Contact Information Primary Emergency Contact: Rumer,Barbara Address: [address]          [place], [place] [address] [place] Phone: [phone] Relation: Spouse Secondary Emergency Contact: Shattuck,Lynn  [place] Mobile Phone: [phone] Relation: Daughter  Code Status:  DNR Goals of care: Advanced Directive information Advanced Directives 04/24/2017  Does Patient Have a Medical Advance Directive? Yes  Type of Advance Directive Out of facility DNR (pink MOST or yellow form);[place];Living will  Does patient want to make changes to medical advance directive? -  Copy of [place] in Chart? Yes  Pre-existing out of facility DNR order (yellow form or pink MOST form) Yellow form placed in chart (order not valid for inpatient use)     Chief Complaint  Patient presents with  . Acute Visit    wandering per staff    HPI:  Pt is a 83 y.o. male seen today at [facility] for an acute visit for evaluation of confusion. He has a significant medical history of dementia without any behavioral issues. He is seen in his room today. Facility Nurse reports patient wandered off the skilled Nursing facility to Assisted Living hallway heading to [place] [facility]. He was redirected to the skilled facility. Nurse states patient's wife lives in the independent  living and may be patient [place].Nurse states patient did not resist being redirected to his room.No fever, chills or cough reported.     Past Medical History:  Diagnosis Date  . Colon tumor 1985  . Elevated prostate specific antigen (PSA) 05/13/2011  . Herpes zoster 05/13/2011  . Herpes zoster with ophthalmic complication 06/04/2228  . Hypertrophy of prostate without urinary obstruction and other lower urinary tract symptoms (LUTS) 09/20/2010  . Hypothyroid 01/29/2017  . Memory loss 03/25/2009  . 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 Nodular prostate without urinary obstruction 05/18/2012  . Osteoarthrosis, unspecified whether generalized or localized, unspecified site 09/20/2010  . Other and unspecified hyperlipidemia 09/20/2010  . Personal history of colonic polyps 09/20/2010  . Unspecified constipation 09/24/2010  . Unspecified glaucoma(365.9) 09/20/2010   bilateral   Past Surgical History:  Procedure Laterality Date  . APPENDECTOMY  1940  . CATARACT EXTRACTION W/ INTRAOCULAR LENS IMPLANT Right 2010   Dr. Ellie Lunch  . COLECTOMY  07/17/1997   sigmoid submucosal lipoma Dr. Oletta Lamas  . COLONOSCOPY  2000   normal  . COLONOSCOPY  04/21/2002   polypectomy  . COLONOSCOPY  06/10/2004   no polyps  . SKIN CANCER EXCISION Right 05/19/2013   ear Dr. Dannette Barbara  . TONSILLECTOMY  1938  . TUMOR REMOVAL  1993   Removal of Benign Intestinal Tumor   . VASECTOMY  1972    No Known Allergies  Allergies as of 04/24/2017   No Known Allergies     Medication List       Accurate as of 04/24/17 12:23 PM. Always use your most recent med list.  acetaminophen 650 MG CR tablet Commonly known as:  TYLENOL Take 1,300 mg by mouth at bedtime.   albuterol (2.5 MG/3ML) 0.083% nebulizer solution Commonly known as:  PROVENTIL Take 3 mLs (2.5 mg total) by nebulization every 4 (four) hours as needed for wheezing or shortness of breath.   bisacodyl 10 MG  suppository Commonly known as:  DULCOLAX Place 10 mg rectally every other day. As needed   CENTRUM tablet Take 1 tablet by mouth daily.   dorzolamide-timolol 22.3-6.8 MG/ML ophthalmic solution Commonly known as:  COSOPT Place 1 drop into both eyes 2 (two) times daily. 8PM and 8PM   finasteride 5 MG tablet Commonly known as:  PROSCAR Take 5 mg by mouth daily.   hydrocortisone cream 1 % Apply 1 application topically as needed for itching.   latanoprost 0.005 % ophthalmic solution Commonly known as:  XALATAN Place 1 drop into both eyes at bedtime.   polyethylene glycol packet Commonly known as:  MIRALAX / GLYCOLAX Take 17 g by mouth daily as needed for moderate constipation.   senna-docusate 8.6-50 MG tablet Commonly known as:  Senokot-S Take 1 tablet by mouth 2 (two) times daily.   tamsulosin 0.4 MG Caps capsule Commonly known as:  FLOMAX Take 0.4 mg by mouth daily.   traZODone 50 MG tablet Commonly known as:  DESYREL Take 25 mg by mouth at bedtime.   zinc oxide 20 % ointment Apply 1 application topically 3 (three) times daily as needed. Apply to buttocks       Review of Systems  Constitutional: Negative for activity change, appetite change, chills, fatigue and fever.  HENT: Negative for congestion, rhinorrhea, sinus pain, sinus pressure, sneezing and sore throat.   Eyes: Negative for pain, discharge, redness and itching.  Respiratory: Negative for cough, chest tightness, shortness of breath and wheezing.   Cardiovascular: Positive for leg swelling. Negative for chest pain and palpitations.  Gastrointestinal: Negative for abdominal distention, abdominal pain, constipation, diarrhea, nausea and vomiting.  Genitourinary: Negative for dysuria, flank pain, frequency and urgency.  Musculoskeletal: Positive for arthralgias and gait problem.  Skin: Negative for color change, pallor, rash and wound.  Neurological: Negative for dizziness, seizures and headaches.   Hematological: Does not bruise/bleed easily.  Psychiatric/Behavioral: Negative for agitation, confusion, hallucinations and sleep disturbance. The patient is not nervous/anxious.     Immunization History  Administered Date(s) Administered  . Influenza Whole 06/29/2012, 06/30/2013  . Influenza-Unspecified 07/13/2014, 07/10/2015, 07/10/2016  . PPD Test 07/02/2016, 07/16/2016  . Pneumococcal Conjugate-13 05/20/2016  . Pneumococcal Polysaccharide-23 09/30/1995  . Td 09/29/1994  . Zoster 09/30/2007   Pertinent  Health Maintenance Due  Topic Date Due  . INFLUENZA VACCINE  04/29/2017  . PNA vac Low Risk Adult  Completed   Fall Risk  07/03/2016 05/20/2016 02/05/2016 11/20/2015 05/22/2015  Falls in the past year? Yes Yes Yes Yes Yes  Number falls in past yr: 2 or more 2 or more 2 or more 1 2 or more  Injury with Fall? Yes No Yes Yes Yes  Risk Factor Category  - High Fall Risk High Fall Risk - High Fall Risk  Risk for fall due to : - - History of fall(s);Impaired mobility - -    Vitals:   04/24/17 1100  BP: (!) 111/59  Pulse: 72  Resp: (!) 22  Temp: (!) 97.5 F (36.4 C)  SpO2: 94%  Weight: 162 lb 14.4 oz (73.9 kg)  Height: 5\' 8"  (1.727 m)   Body mass index is 24.77 kg/m.  Physical Exam  Constitutional: He is oriented to person, place, and time. He appears well-developed and well-nourished. No distress.  HENT:  Head: Normocephalic.  Mouth/Throat: Oropharynx is clear and moist. No oropharyngeal exudate.  Eyes: Pupils are equal, round, and reactive to light. Conjunctivae and EOM are normal. Right eye exhibits no discharge. Left eye exhibits no discharge. No scleral icterus.  Neck: Normal range of motion. No JVD present. No thyromegaly present.  Cardiovascular: Normal rate, regular rhythm, normal heart sounds and intact distal pulses.  Exam reveals no gallop and no friction rub.   No murmur heard. Pulmonary/Chest: Effort normal and breath sounds normal. No respiratory distress. He has  no wheezes. He has no rales.  Abdominal: Soft. Bowel sounds are normal. He exhibits no distension. There is no tenderness. There is no rebound and no guarding.  Musculoskeletal: He exhibits no tenderness or deformity.  Unsteady gait self propels on wheelchair. Bilateral lower extremities 1-2+ edema Ted hose in place.   Lymphadenopathy:    He has no cervical adenopathy.  Neurological: He is oriented to person, place, and time. Coordination normal.  Skin: Skin is warm and dry. No rash noted. No erythema. No pallor.  Psychiatric: He has a normal mood and affect.   Labs reviewed:  Recent Labs  06/30/16 1420 07/01/16 0354 01/26/17  NA 137 136 138  K 3.9 3.7 4.8  CL 104 109  --   CO2 26 22  --   GLUCOSE 107* 92  --   BUN 22* 17 24*  CREATININE 0.90 0.86 1.3  CALCIUM 8.7* 7.9*  --     Recent Labs  06/30/16 1420 01/26/17  AST 18 21  ALT 12* 13  ALKPHOS 79 84  BILITOT 1.1  --   PROT 6.9  --   ALBUMIN 3.6  --     Recent Labs  06/30/16 1420 07/01/16 0354 01/26/17  WBC 12.7* 11.8* 8.6  NEUTROABS  --  8.4*  --   HGB 12.5* 10.8* 13.8  HCT 37.9* 32.6* 42  MCV 92.0 91.8  --   PLT 168 165 192   Lab Results  Component Value Date   TSH 1.83 03/19/2017   No results found for: HGBA1C Lab Results  Component Value Date   CHOL 173 11/12/2015   HDL 44 11/12/2015   LDLCALC 109 11/12/2015   TRIG 102 11/12/2015    Significant Diagnostic Results in last 30 days:  No results found.  Assessment/Plan   Dementia without behavioral disturbance VSS.No signs of urinary tract infections.Wander off the SNF to ALF heading towards [place]. Patient's wife lives in [place] maybe was trying to visit wife. No behavioral issues reported. Apply wander Guard to due to high risk for wandering.continue to monitor.   Family/ staff Communication: Reviewed plan of care with patient and facility Nurse.   Labs/tests ordered: None   Londen Bok C Kamariya Blevens, NP

## 2017-05-01 ENCOUNTER — Non-Acute Institutional Stay (SKILLED_NURSING_FACILITY): Payer: PPO

## 2017-05-01 DIAGNOSIS — Z0000 Encounter for general adult medical examination without abnormal findings: Secondary | ICD-10-CM | POA: Diagnosis not present

## 2017-05-01 NOTE — Progress Notes (Signed)
Subjective:   Joel [name] is a 83 y.o. male who presents for an Initial Medicare Annual Wellness Visit at [facility]   Objective:    Today's Vitals   05/01/17 1312  BP: 112/64  Pulse: 72  Temp: 98 F (36.7 C)  TempSrc: Oral  SpO2: 97%  Weight: 163 lb (73.9 kg)  Height: 5\' 8"  (1.727 m)   Body mass index is 24.78 kg/m.  Current Medications (verified) Outpatient Encounter Prescriptions as of 05/01/2017  Medication Sig  . acetaminophen (TYLENOL) 650 MG CR tablet Take 1,300 mg by mouth at bedtime.  Marland Kitchen albuterol (PROVENTIL) (2.5 MG/3ML) 0.083% nebulizer solution Take 3 mLs (2.5 mg total) by nebulization every 4 (four) hours as needed for wheezing or shortness of breath.  . bisacodyl (DULCOLAX) 10 MG suppository Place 10 mg rectally every other day. As needed  . dorzolamide-timolol (COSOPT) 22.3-6.8 MG/ML ophthalmic solution Place 1 drop into both eyes 2 (two) times daily. 8PM and 8PM  . finasteride (PROSCAR) 5 MG tablet Take 5 mg by mouth daily.   . hydrocortisone cream 1 % Apply 1 application topically as needed for itching.  . latanoprost (XALATAN) 0.005 % ophthalmic solution Place 1 drop into both eyes at bedtime.   . Multiple Vitamins-Minerals (CENTRUM) tablet Take 1 tablet by mouth daily.  . polyethylene glycol (MIRALAX / GLYCOLAX) packet Take 17 g by mouth daily as needed for moderate constipation.  . senna-docusate (SENOKOT-S) 8.6-50 MG tablet Take 1 tablet by mouth 2 (two) times daily.   . tamsulosin (FLOMAX) 0.4 MG CAPS capsule Take 0.4 mg by mouth daily.   . traZODone (DESYREL) 50 MG tablet Take 25 mg by mouth at bedtime.  Marland Kitchen zinc oxide 20 % ointment Apply 1 application topically 3 (three) times daily as needed. Apply to buttocks   No facility-administered encounter medications on file as of 05/01/2017.     Allergies (verified) Patient has no known allergies.   History: Past Medical History:  Diagnosis Date  . Colon tumor 1985  . Elevated  prostate specific antigen (PSA) 05/13/2011  . Herpes zoster 05/13/2011  . Herpes zoster with ophthalmic complication 0/04/6577  . Hypertrophy of prostate without urinary obstruction and other lower urinary tract symptoms (LUTS) 09/20/2010  . Hypothyroid 01/29/2017  . Memory loss 03/25/2009  . 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 Nodular prostate without urinary obstruction 05/18/2012  . Osteoarthrosis, unspecified whether generalized or localized, unspecified site 09/20/2010  . Other and unspecified hyperlipidemia 09/20/2010  . Personal history of colonic polyps 09/20/2010  . Unspecified constipation 09/24/2010  . Unspecified glaucoma(365.9) 09/20/2010   bilateral   Past Surgical History:  Procedure Laterality Date  . APPENDECTOMY  1940  . CATARACT EXTRACTION W/ INTRAOCULAR LENS IMPLANT Right 2010   Dr. Ellie Lunch  . COLECTOMY  07/17/1997   sigmoid submucosal lipoma Dr. Oletta Lamas  . COLONOSCOPY  2000   normal  . COLONOSCOPY  04/21/2002   polypectomy  . COLONOSCOPY  06/10/2004   no polyps  . SKIN CANCER EXCISION Right 05/19/2013   ear Dr. Dannette Barbara  . TONSILLECTOMY  1938  . TUMOR REMOVAL  1993   Removal of Benign Intestinal Tumor   . VASECTOMY  1972   Family History  Problem Relation Age of Onset  . Heart disease Father    Social History   Occupational History  . retired Medical illustrator    Social History Main Topics  . Smoking status: Former Audiological scientist  date: 04/12/1974  . Smokeless tobacco: Never Used  . Alcohol use No     Comment: none in several years  . Drug use: No  . Sexual activity: Not on file   Tobacco Counseling Counseling given: Not Answered   Activities of Daily Living In your present state of health, do you have any difficulty performing the following activities: 05/01/2017 06/30/2016  Hearing? N Y  Vision? N N  Difficulty concentrating or making decisions? [name]  Walking or climbing stairs? Y -  Dressing or bathing? Y Y  Doing errands,  shopping? Y -  Conservation officer, nature and eating ? Y -  Using the Toilet? Y -  In the past six months, have you accidently leaked urine? Y -  Do you have problems with loss of bowel control? N -  Managing your Medications? Y -  Managing your Finances? Y -  Housekeeping or managing your Housekeeping? Y -  Some recent data might be hidden    Immunizations and Health Maintenance Immunization History  Administered Date(s) Administered  . Influenza Whole 06/29/2012, 06/30/2013  . Influenza-Unspecified 07/13/2014, 07/10/2015, 07/10/2016  . PPD Test 07/02/2016, 07/16/2016  . Pneumococcal Conjugate-13 05/20/2016  . Pneumococcal Polysaccharide-23 09/30/1995  . Td 09/29/1994  . Zoster 09/30/2007   Health Maintenance Due  Topic Date Due  . INFLUENZA VACCINE  04/29/2017    Patient Care Team: Blanchie Serve, MD as PCP - General (Internal Medicine) Melina Modena, [facility] [name], MD as Consulting Physician (Ophthalmology) [name], MD as Consulting Physician (Gastroenterology) [name], MD as Consulting Physician (Urology) [name], MD as Consulting Physician (Dermatology) Ngetich, Nelda Bucks, NP as Nurse Practitioner (Family Medicine)  Indicate any recent Medical Services you may have received from other than [facility] providers in the past year (date may be approximate).    Assessment:   This is a routine wellness examination for Joel [name].   Hearing/Vision screen No exam data present  Dietary issues and exercise activities discussed: Current Exercise Habits: The patient does not participate in regular exercise at present, Exercise limited by: orthopedic condition(s)  Goals    None     Depression Screen PHQ 2/9 Scores 05/01/2017 05/20/2016 11/21/2014 05/23/2014  PHQ - 2 Score 0 0 0 0    Fall Risk Fall Risk  05/01/2017 07/03/2016 05/20/2016 02/05/2016 11/20/2015  Falls in the past year? Yes Yes Yes Yes Yes  Number falls in past yr: 2 or more 2 or more 2 or more 2 or more 1    Comment - - 10 times in past 5 months - -  Injury with Fall? No Yes No Yes Yes  Comment - 07/03/16 laceration forehead, subdural hematome - - scape on elbow  Risk Factor Category  - - High Fall Risk High Fall Risk -  Risk for fall due to : - - - History of fall(s);Impaired mobility -    Cognitive Function: MMSE - Mini Mental State Exam 11/20/2015 05/23/2014 05/24/2013  Not completed: (No Data) - -  Orientation to time 4 5 5   Orientation to Place 5 5 5   Registration 3 3 3   Attention/ Calculation 4 4 5   Recall 0 2 2  Language- name 2 objects 2 2 2   Language- repeat 1 1 1   Language- follow 3 step command 3 3 3   Language- read & follow direction 1 1 1   Write a sentence 1 1 1   Copy design 0 1 0  Total score 24 28 28      6CIT Screen  05/01/2017  What Year? 0 points  What month? 3 points  What time? 3 points  Count back from 20 0 points  Months in reverse 4 points  Repeat phrase 4 points  Total Score 14    Screening Tests Health Maintenance  Topic Date Due  . INFLUENZA VACCINE  04/29/2017  . TETANUS/TDAP  10/24/2023 (Originally 09/29/2004)  . PNA vac Low Risk Adult  Completed        Plan:    I have personally reviewed and addressed the Medicare Annual Wellness questionnaire and have noted the following in the patient's chart:  A. Medical and social history B. Use of alcohol, tobacco or illicit drugs  C. Current medications and supplements D. Functional ability and status E.  Nutritional status F.  Physical activity G. Advance directives H. List of other physicians I.  Hospitalizations, surgeries, and ER visits in previous 12 months J.  [place] to include hearing, vision, cognitive, depression L. Referrals and appointments - none  In addition, I have reviewed and discussed with patient certain preventive protocols, quality metrics, and best practice recommendations. A written personalized care plan for preventive services as well as general preventive health  recommendations were provided to patient.  See attached scanned questionnaire for additional information.   Signed,   Rich Reining, RN Nurse Health Advisor   Quick Notes   Health Maintenance:  TDAP due     Abnormal Screen: 6 CIT-14     Patient Concerns: None     Nurse Concerns: None

## 2017-05-01 NOTE — Patient Instructions (Signed)
Mr. Joel [name] , Thank you for taking time to come for your Medicare Wellness Visit. I appreciate your ongoing commitment to your health goals. Please review the following plan we discussed and let me know if I can assist you in the future.   Screening recommendations/referrals: Colonoscopy excluded, pt over age 81 Recommended yearly ophthalmology/optometry visit for glaucoma screening and checkup Recommended yearly dental visit for hygiene and checkup  Vaccinations: Influenza vaccine due 2018 fall season Pneumococcal vaccine up to date Tdap vaccine due  Shingles vaccine not in records    Advanced directives: In Chart  Conditions/risks identified: None  Next appointment: Dr. Bubba Camp makes rounds  Preventive Care 22 Years and Older, Male Preventive care refers to lifestyle choices and visits with your health care provider that can promote health and wellness. What does preventive care include?  A yearly physical exam. This is also called an annual well check.  Dental exams once or twice a year.  Routine eye exams. Ask your health care provider how often you should have your eyes checked.  Personal lifestyle choices, including:  Daily care of your teeth and gums.  Regular physical activity.  Eating a healthy diet.  Avoiding tobacco and drug use.  Limiting alcohol use.  Practicing safe sex.  Taking low doses of aspirin every day.  Taking vitamin and mineral supplements as recommended by your health care provider. What happens during an annual well check? The services and screenings done by your health care provider during your annual well check will depend on your age, overall health, lifestyle risk factors, and family history of disease. Counseling  Your health care provider may ask you questions about your:  Alcohol use.  Tobacco use.  Drug use.  Emotional well-being.  Home and relationship well-being.  Sexual activity.  Eating habits.  History of  falls.  Memory and ability to understand (cognition).  Work and work Statistician. Screening  You may have the following tests or measurements:  Height, weight, and BMI.  Blood pressure.  Lipid and cholesterol levels. These may be checked every 5 years, or more frequently if you are over 81 years old.  Skin check.  Lung cancer screening. You may have this screening every year starting at age 96 if you have a 30-pack-year history of smoking and currently smoke or have quit within the past 15 years.  Fecal occult blood test (FOBT) of the stool. You may have this test every year starting at age 31.  Flexible sigmoidoscopy or colonoscopy. You may have a sigmoidoscopy every 5 years or a colonoscopy every 10 years starting at age 96.  Prostate cancer screening. Recommendations will vary depending on your family history and other risks.  Hepatitis C blood test.  Hepatitis B blood test.  Sexually transmitted disease (STD) testing.  Diabetes screening. This is done by checking your blood sugar (glucose) after you have not eaten for a while (fasting). You may have this done every 1-3 years.  Abdominal aortic aneurysm (AAA) screening. You may need this if you are a current or former smoker.  Osteoporosis. You may be screened starting at age 26 if you are at high risk. Talk with your health care provider about your test results, treatment options, and if necessary, the need for more tests. Vaccines  Your health care provider may recommend certain vaccines, such as:  Influenza vaccine. This is recommended every year.  Tetanus, diphtheria, and acellular pertussis (Tdap, Td) vaccine. You may need a Td booster every 10 years.  Zoster  vaccine. You may need this after age 23.  Pneumococcal 13-valent conjugate (PCV13) vaccine. One dose is recommended after age 41.  Pneumococcal polysaccharide (PPSV23) vaccine. One dose is recommended after age 68. Talk to your health care provider about  which screenings and vaccines you need and how often you need them. This information is not intended to replace advice given to you by your health care provider. Make sure you discuss any questions you have with your health care provider. Document Released: 10/12/2015 Document Revised: 06/04/2016 Document Reviewed: 07/17/2015 Elsevier Interactive Patient Education  2017 [employer] Prevention in the Home Falls can cause injuries. They can happen to people of all ages. There are many things you can do to make your home safe and to help prevent falls. What can I do on the outside of my home?  Regularly fix the edges of walkways and driveways and fix any cracks.  Remove anything that might make you trip as you walk through a door, such as a raised step or threshold.  Trim any bushes or trees on the path to your home.  Use bright outdoor lighting.  Clear any walking paths of anything that might make someone trip, such as rocks or tools.  Regularly check to see if handrails are loose or broken. Make sure that both sides of any steps have handrails.  Any raised decks and porches should have guardrails on the edges.  Have any leaves, snow, or ice cleared regularly.  Use sand or salt on walking paths during winter.  Clean up any spills in your garage right away. This includes oil or grease spills. What can I do in the bathroom?  Use night lights.  Install grab bars by the toilet and in the tub and shower. Do not use towel bars as grab bars.  Use non-skid mats or decals in the tub or shower.  If you need to sit down in the shower, use a plastic, non-slip stool.  Keep the floor dry. Clean up any water that spills on the floor as soon as it happens.  Remove soap buildup in the tub or shower regularly.  Attach bath mats securely with double-sided non-slip rug tape.  Do not have throw rugs and other things on the floor that can make you trip. What can I do in the  bedroom?  Use night lights.  Make sure that you have a light by your bed that is easy to reach.  Do not use any sheets or blankets that are too big for your bed. They should not hang down onto the floor.  Have a firm chair that has side arms. You can use this for support while you get dressed.  Do not have throw rugs and other things on the floor that can make you trip. What can I do in the kitchen?  Clean up any spills right away.  Avoid walking on wet floors.  Keep items that you use a lot in easy-to-reach places.  If you need to reach something above you, use a strong step stool that has a grab bar.  Keep electrical cords out of the way.  Do not use floor polish or wax that makes floors slippery. If you must use wax, use non-skid floor wax.  Do not have throw rugs and other things on the floor that can make you trip. What can I do with my stairs?  Do not leave any items on the stairs.  Make sure that there are handrails  on both sides of the stairs and use them. Fix handrails that are broken or loose. Make sure that handrails are as long as the stairways.  Check any carpeting to make sure that it is firmly attached to the stairs. Fix any carpet that is loose or worn.  Avoid having throw rugs at the top or bottom of the stairs. If you do have throw rugs, attach them to the floor with carpet tape.  Make sure that you have a light switch at the top of the stairs and the bottom of the stairs. If you do not have them, ask someone to add them for you. What else can I do to help prevent falls?  Wear shoes that:  Do not have high heels.  Have rubber bottoms.  Are comfortable and fit you well.  Are closed at the toe. Do not wear sandals.  If you use a stepladder:  Make sure that it is fully opened. Do not climb a closed stepladder.  Make sure that both sides of the stepladder are locked into place.  Ask someone to hold it for you, if possible.  Clearly mark and make  sure that you can see:  Any grab bars or handrails.  First and last steps.  Where the edge of each step is.  Use tools that help you move around (mobility aids) if they are needed. These include:  Canes.  Walkers.  Scooters.  Crutches.  Turn on the lights when you go into a dark area. Replace any light bulbs as soon as they burn out.  Set up your furniture so you have a clear path. Avoid moving your furniture around.  If any of your floors are uneven, fix them.  If there are any pets around you, be aware of where they are.  Review your medicines with your doctor. Some medicines can make you feel dizzy. This can increase your chance of falling. Ask your doctor what other things that you can do to help prevent falls. This information is not intended to replace advice given to you by your health care provider. Make sure you discuss any questions you have with your health care provider. Document Released: 07/12/2009 Document Revised: 02/21/2016 Document Reviewed: 10/20/2014 Elsevier Interactive Patient Education  2017 [employer].

## 2017-05-06 ENCOUNTER — Encounter: Payer: Self-pay | Admitting: Family

## 2017-05-06 ENCOUNTER — Non-Acute Institutional Stay (SKILLED_NURSING_FACILITY): Payer: PPO | Admitting: Family

## 2017-05-06 DIAGNOSIS — M150 Primary generalized (osteo)arthritis: Secondary | ICD-10-CM

## 2017-05-06 DIAGNOSIS — N400 Enlarged prostate without lower urinary tract symptoms: Secondary | ICD-10-CM | POA: Diagnosis not present

## 2017-05-06 DIAGNOSIS — Z23 Encounter for immunization: Secondary | ICD-10-CM | POA: Diagnosis not present

## 2017-05-06 DIAGNOSIS — K5901 Slow transit constipation: Secondary | ICD-10-CM

## 2017-05-06 DIAGNOSIS — M159 Polyosteoarthritis, unspecified: Secondary | ICD-10-CM

## 2017-05-07 NOTE — Progress Notes (Signed)
Location:  [facility] Room Number: 7 Place of Service:  SNF (31) Provider: Dinah Ngetich FNP-C   Blanchie Serve, MD  Patient Care Team: Blanchie Serve, MD as PCP - General (Internal Medicine) Melina Modena, [facility] [name], MD as Consulting Physician (Ophthalmology) [name], MD as Consulting Physician (Gastroenterology) [name], MD as Consulting Physician (Urology) [name], MD as Consulting Physician (Dermatology) Ngetich, Nelda Bucks, NP as Nurse Practitioner (Family Medicine)  Extended Emergency Contact Information Primary Emergency Contact: Silvio,Barbara Address: [address]          [place], [place] [address] [place] Phone: [phone] Relation: Spouse Secondary Emergency Contact: Shattuck,Lynn  [place] Mobile Phone: [phone] Relation: Daughter  Code Status: DNR Goals of care: Advanced Directive information Advanced Directives 05/06/2017  Does Patient Have a Medical Advance Directive? Yes  Type of Advance Directive Out of facility DNR (pink MOST or yellow form);Living will;Healthcare Power of Attorney  Does patient want to make changes to medical advance directive? -  Copy of [place] in Chart? Yes  Pre-existing out of facility DNR order (yellow form or pink MOST form) -     Chief Complaint  Patient presents with  . Medical Management of Chronic Issues    routine visit    HPI:  Pt is a 83 y.o. male seen today [place] for medical management of chronic diseases. He has a medical history of Dementia without behavioral disturbance, BPH,CKD,OA among other conditions. He seen in his room today.He denies any acute issues this visit. He has had no recent fall episode, acute illness or Hospital admission. He continues to self propel on wheelchair and requires assistance with ADL's. Patient's wife request new zoster vaccine to be administered. She states  patient had bad case of shingles that affected left side of face and eye requiring hospital admission about six years ago.Facility Nurse reports no new concerns.     Past Medical History:  Diagnosis Date  . Colon tumor 1985  . Elevated prostate specific antigen (PSA) 05/13/2011  . Herpes zoster 05/13/2011  . Herpes zoster with ophthalmic complication 05/31/3299  . Hypertrophy of prostate without urinary obstruction and other lower urinary tract symptoms (LUTS) 09/20/2010  . Hypothyroid 01/29/2017  . Memory loss 03/25/2009  . 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 Nodular prostate without urinary obstruction 05/18/2012  . Osteoarthrosis, unspecified whether generalized or localized, unspecified site 09/20/2010  . Other and unspecified hyperlipidemia 09/20/2010  . Personal history of colonic polyps 09/20/2010  . Unspecified constipation 09/24/2010  . Unspecified glaucoma(365.9) 09/20/2010   bilateral   Past Surgical History:  Procedure Laterality Date  . APPENDECTOMY  1940  . CATARACT EXTRACTION W/ INTRAOCULAR LENS IMPLANT Right 2010   Dr. Ellie Lunch  . COLECTOMY  07/17/1997   sigmoid submucosal lipoma Dr. Oletta Lamas  . COLONOSCOPY  2000   normal  . COLONOSCOPY  04/21/2002   polypectomy  . COLONOSCOPY  06/10/2004   no polyps  . SKIN CANCER EXCISION Right 05/19/2013   ear Dr. Dannette Barbara  . TONSILLECTOMY  1938  . TUMOR REMOVAL  1993   Removal of Benign Intestinal Tumor   . VASECTOMY  1972    No Known Allergies  Allergies as of 05/06/2017   No Known Allergies     Medication List       Accurate as of 05/06/17 11:59 PM. Always use your most recent med list.  acetaminophen 650 MG CR tablet Commonly known as:  TYLENOL Take 1,300 mg by mouth at bedtime.   albuterol (2.5 MG/3ML) 0.083% nebulizer solution Commonly known as:  PROVENTIL Take 3 mLs (2.5 mg total) by nebulization every 4 (four) hours as needed for wheezing or shortness of breath.   bisacodyl 10 MG  suppository Commonly known as:  DULCOLAX Place 10 mg rectally every other day. As needed   CENTRUM tablet Take 1 tablet by mouth daily.   dorzolamide-timolol 22.3-6.8 MG/ML ophthalmic solution Commonly known as:  COSOPT Place 1 drop into both eyes 2 (two) times daily. 8PM and 8PM   finasteride 5 MG tablet Commonly known as:  PROSCAR Take 5 mg by mouth daily.   hydrocortisone cream 1 % Apply 1 application topically as needed for itching.   latanoprost 0.005 % ophthalmic solution Commonly known as:  XALATAN Place 1 drop into both eyes at bedtime.   polyethylene glycol packet Commonly known as:  MIRALAX / GLYCOLAX Take 17 g by mouth daily as needed for moderate constipation.   senna-docusate 8.6-50 MG tablet Commonly known as:  Senokot-S Take 1 tablet by mouth 2 (two) times daily.   tamsulosin 0.4 MG Caps capsule Commonly known as:  FLOMAX Take 0.4 mg by mouth daily.   traZODone 50 MG tablet Commonly known as:  DESYREL Take 25 mg by mouth at bedtime.   zinc oxide 20 % ointment Apply 1 application topically 3 (three) times daily as needed. Apply to buttocks       Review of Systems  Constitutional: Negative for activity change, appetite change, chills, fatigue and fever.  HENT: Negative for congestion, rhinorrhea, sinus pain, sinus pressure, sneezing and sore throat.   Eyes: Negative for pain, discharge, redness and itching.  Respiratory: Negative for cough, chest tightness, shortness of breath and wheezing.   Cardiovascular: Positive for leg swelling. Negative for chest pain and palpitations.  Gastrointestinal: Negative for abdominal distention, abdominal pain, constipation, diarrhea, nausea and vomiting.  Endocrine: Negative for cold intolerance, heat intolerance, polydipsia, polyphagia and polyuria.  Genitourinary: Negative for dysuria, flank pain, frequency and urgency.  Musculoskeletal: Positive for arthralgias and gait problem.  Skin: Negative for color change,  pallor, rash and wound.  Neurological: Negative for dizziness, seizures and headaches.  Hematological: Does not bruise/bleed easily.  Psychiatric/Behavioral: Negative for agitation, confusion, hallucinations and sleep disturbance. The patient is not nervous/anxious.     Immunization History  Administered Date(s) Administered  . Influenza Whole 06/29/2012, 06/30/2013  . Influenza-Unspecified 07/13/2014, 07/10/2015, 07/10/2016  . PPD Test 07/02/2016, 07/16/2016  . Pneumococcal Conjugate-13 05/20/2016  . Pneumococcal Polysaccharide-23 09/30/1995  . Td 09/29/1994  . Zoster 09/30/2007   Pertinent  Health Maintenance Due  Topic Date Due  . INFLUENZA VACCINE  04/29/2017  . PNA vac Low Risk Adult  Completed   Fall Risk  05/01/2017 07/03/2016 05/20/2016 02/05/2016 11/20/2015  Falls in the past year? Yes Yes Yes Yes Yes  Number falls in past yr: 2 or more 2 or more 2 or more 2 or more 1  Comment - - 10 times in past 5 months - -  Injury with Fall? No Yes No Yes Yes  Comment - 07/03/16 laceration forehead, subdural hematome - - scape on elbow  Risk Factor Category  - - High Fall Risk High Fall Risk -  Risk for fall due to : - - - History of fall(s);Impaired mobility -    Vitals:   05/06/17 1140  BP: 93/64  Pulse: 78  Resp:  18  Temp: 97.8 F (36.6 C)  SpO2: 98%  Weight: 161 lb 9.6 oz (73.3 kg)  Height: 5\' 8"  (1.727 m)   Body mass index is 24.57 kg/m. Physical Exam  Constitutional: He is oriented to person, place, and time. He appears well-developed and well-nourished. No distress.  Pleasant elderly in no acute distress   HENT:  Head: Normocephalic.  Mouth/Throat: Oropharynx is clear and moist. No oropharyngeal exudate.  Eyes: Pupils are equal, round, and reactive to light. Conjunctivae and EOM are normal. Right eye exhibits no discharge. Left eye exhibits no discharge. No scleral icterus.  Neck: Normal range of motion. No JVD present. No thyromegaly present.  Cardiovascular: Normal  rate, regular rhythm, normal heart sounds and intact distal pulses.  Exam reveals no gallop and no friction rub.   No murmur heard. Pulmonary/Chest: Effort normal and breath sounds normal. No respiratory distress. He has no wheezes. He has no rales.  Abdominal: Soft. Bowel sounds are normal. He exhibits no distension. There is no tenderness. There is no rebound and no guarding.  Musculoskeletal: He exhibits no tenderness or deformity.  Unsteady gait uses wheelchair on facility hallway.  lower extremities 1+ edema bilateral Ted hose in place.   Lymphadenopathy:    He has no cervical adenopathy.  Neurological: He is oriented to person, place, and time. Coordination normal.  Skin: Skin is warm and dry. No rash noted. No erythema. No pallor.  Psychiatric: He has a normal mood and affect.   Labs reviewed:  Recent Labs  06/30/16 1420 07/01/16 0354 01/26/17  NA 137 136 138  K 3.9 3.7 4.8  CL 104 109  --   CO2 26 22  --   GLUCOSE 107* 92  --   BUN 22* 17 24*  CREATININE 0.90 0.86 1.3  CALCIUM 8.7* 7.9*  --     Recent Labs  06/30/16 1420 01/26/17  AST 18 21  ALT 12* 13  ALKPHOS 79 84  BILITOT 1.1  --   PROT 6.9  --   ALBUMIN 3.6  --     Recent Labs  06/30/16 1420 07/01/16 0354 01/26/17  WBC 12.7* 11.8* 8.6  NEUTROABS  --  8.4*  --   HGB 12.5* 10.8* 13.8  HCT 37.9* 32.6* 42  MCV 92.0 91.8  --   PLT 168 165 192   Lab Results  Component Value Date   TSH 1.83 03/19/2017   No results found for: HGBA1C Lab Results  Component Value Date   CHOL 173 11/12/2015   HDL 44 11/12/2015   LDLCALC 109 11/12/2015   TRIG 102 11/12/2015    Significant Diagnostic Results in last 30 days:  No results found.  Assessment/Plan 1. Primary osteoarthritis involving multiple joints Continue on tylenol 650 mg tablet.Fall and safety precautions.Continue to monitor.   2. Benign prostatic hyperplasia without lower urinary tract symptoms Stable. Continue on finasteride 5 mg tablet and  Flomax 0.4 mg capsule.Monitor for urinary retention.   3. Slow transit constipation Current regimen effective. Continue to encourage oral intake and rehydration.   4. Need for zoster vaccine Patient's wife request new vaccine due to previous history of facial shingles with ocular involvement. Shingrix effects discussed with patient and wife would like vaccine to be administered.discussed with Dr.pandey patient's wife request for new vaccine. MD in agreement with vaccine may order but vaccine currently on nationwide back up.Will order shingrix 0.5 mls I.M x 1 dose then repeat second dose in two month.Notify provider for any adverse reaction.  Family/ staff Communication: Reviewed plan of care with Dr. Kandra Nicolas wife, patient and facility Nurse.   Labs/tests ordered: None   Dinah C Ngetich, NP

## 2017-05-11 ENCOUNTER — Encounter: Payer: Self-pay | Admitting: Internal Medicine

## 2017-05-11 ENCOUNTER — Non-Acute Institutional Stay (SKILLED_NURSING_FACILITY): Payer: PPO | Admitting: Internal Medicine

## 2017-05-11 DIAGNOSIS — G4700 Insomnia, unspecified: Secondary | ICD-10-CM | POA: Diagnosis not present

## 2017-05-11 DIAGNOSIS — R400 Somnolence: Secondary | ICD-10-CM

## 2017-05-11 NOTE — Progress Notes (Signed)
Location:  [facility] Room Number: 7 Place of Service:  SNF [id number]) Provider:  Blanchie Serve, MD  Blanchie Serve, MD  Patient Care Team: Blanchie Serve, MD as PCP - General (Internal Medicine) Melina Modena, [facility] [name], MD as Consulting Physician (Ophthalmology) [name], MD as Consulting Physician (Gastroenterology) [name], MD as Consulting Physician (Urology) [name], MD as Consulting Physician (Dermatology) Ngetich, Nelda Bucks, NP as Nurse Practitioner [facility] Medicine)  Extended Emergency Contact Information Primary Emergency Contact: Pitner,Barbara Address: [address]          [place], [place] [address] [place] Phone: [phone] Relation: Spouse Secondary Emergency Contact: Winnifred Friar [place] Mobile Phone: [phone] Relation: Daughter  Code Status:  DNR Goals of care: Advanced Directive information Advanced Directives 05/06/2017  Does Patient Have a Medical Advance Directive? Yes  Type of Advance Directive Out of facility DNR (pink MOST or yellow form);Living will;Healthcare Power of Attorney  Does patient want to make changes to medical advance directive? -  Copy of [place] in Chart? Yes  Pre-existing out of facility DNR order (yellow form or pink MOST form) -     Chief Complaint  Patient presents with  . Acute Visit    daytime sleepiness, medication management    HPI:  Pt is a 83 y.o. male seen today for an acute visit for daytime sleepiness and medication management. He has been sleeping well at night. He taking multiple nap during daytime as well. On med review, he is on trazodone 25 mg daily for now.    Past Medical History:  Diagnosis Date  . Colon tumor 1985  . Elevated prostate specific antigen (PSA) 05/13/2011  . Herpes zoster 05/13/2011  . Herpes zoster with ophthalmic complication 12/06/7671  . Hypertrophy of prostate without  urinary obstruction and other lower urinary tract symptoms (LUTS) 09/20/2010  . Hypothyroid 01/29/2017  . Memory loss 03/25/2009  . 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 Nodular prostate without urinary obstruction 05/18/2012  . Osteoarthrosis, unspecified whether generalized or localized, unspecified site 09/20/2010  . Other and unspecified hyperlipidemia 09/20/2010  . Personal history of colonic polyps 09/20/2010  . Unspecified constipation 09/24/2010  . Unspecified glaucoma(365.9) 09/20/2010   bilateral   Past Surgical History:  Procedure Laterality Date  . APPENDECTOMY  1940  . CATARACT EXTRACTION W/ INTRAOCULAR LENS IMPLANT Right 2010   Dr. Ellie Lunch  . COLECTOMY  07/17/1997   sigmoid submucosal lipoma Dr. Oletta Lamas  . COLONOSCOPY  2000   normal  . COLONOSCOPY  04/21/2002   polypectomy  . COLONOSCOPY  06/10/2004   no polyps  . SKIN CANCER EXCISION Right 05/19/2013   ear Dr. Dannette Barbara  . TONSILLECTOMY  1938  . TUMOR REMOVAL  1993   Removal of Benign Intestinal Tumor   . VASECTOMY  1972    No Known Allergies  Outpatient Encounter Prescriptions as of 05/11/2017  Medication Sig  . acetaminophen (TYLENOL) 650 MG CR tablet Take 1,300 mg by mouth at bedtime.  Marland Kitchen albuterol (PROVENTIL) (2.5 MG/3ML) 0.083% nebulizer solution Take 3 mLs (2.5 mg total) by nebulization every 4 (four) hours as needed for wheezing or shortness of breath.  . bisacodyl (DULCOLAX) 10 MG suppository Place 10 mg rectally every other day. As needed  . dorzolamide-timolol (COSOPT) 22.3-6.8 MG/ML ophthalmic solution Place 1 drop into both eyes 2 (two) times daily. 8PM and 8PM  . finasteride (PROSCAR) 5 MG tablet Take 5  mg by mouth daily.   . hydrocortisone cream 1 % Apply 1 application topically as needed for itching.  . latanoprost (XALATAN) 0.005 % ophthalmic solution Place 1 drop into both eyes at bedtime.   . Multiple Vitamins-Minerals (CENTRUM) tablet Take 1 tablet by mouth daily.  . polyethylene  glycol (MIRALAX / GLYCOLAX) packet Take 17 g by mouth daily as needed for moderate constipation.  . senna-docusate (SENOKOT-S) 8.6-50 MG tablet Take 1 tablet by mouth 2 (two) times daily.   . tamsulosin (FLOMAX) 0.4 MG CAPS capsule Take 0.4 mg by mouth daily.   . traZODone (DESYREL) 50 MG tablet Take 25 mg by mouth at bedtime.  Marland Kitchen zinc oxide 20 % ointment Apply 1 application topically 3 (three) times daily as needed. Apply to buttocks   No facility-administered encounter medications on file as of 05/11/2017.     Review of Systems  Constitutional: Negative for appetite change and fever.  HENT: Positive for trouble swallowing.   Respiratory: Negative for shortness of breath.   Cardiovascular: Negative for chest pain.  Gastrointestinal: Negative for nausea and vomiting.  Psychiatric/Behavioral: Positive for confusion.    Immunization History  Administered Date(s) Administered  . Influenza Whole 06/29/2012, 06/30/2013  . Influenza-Unspecified 07/13/2014, 07/10/2015, 07/10/2016  . PPD Test 07/02/2016, 07/16/2016  . Pneumococcal Conjugate-13 05/20/2016  . Pneumococcal Polysaccharide-23 09/30/1995  . Td 09/29/1994  . Zoster 09/30/2007   Pertinent  Health Maintenance Due  Topic Date Due  . INFLUENZA VACCINE  04/29/2017  . PNA vac Low Risk Adult  Completed   Fall Risk  05/01/2017 07/03/2016 05/20/2016 02/05/2016 11/20/2015  Falls in the past year? Yes Yes Yes Yes Yes  Number falls in past yr: 2 or more 2 or more 2 or more 2 or more 1  Comment - - 10 times in past 5 months - -  Injury with Fall? No Yes No Yes Yes  Comment - 07/03/16 laceration forehead, subdural hematome - - scape on elbow  Risk Factor Category  - - High Fall Risk High Fall Risk -  Risk for fall due to : - - - History of fall(s);Impaired mobility -   Functional Status Survey:    Vitals:   05/11/17 1018  BP: 93/64  Pulse: 78  Resp: 18  Temp: (!) 97.4 F (36.3 C)  TempSrc: Oral  SpO2: 98%  Weight: 161 lb 9.6 oz (73.3  kg)  Height: 5\' 8"  (1.727 m)   Body mass index is 24.57 kg/m. Physical Exam  Constitutional: He appears well-developed. No distress.  HENT:  Head: Normocephalic and atraumatic.  Mouth/Throat: Oropharynx is clear and moist.  Eyes: Pupils are equal, round, and reactive to light. Conjunctivae and EOM are normal.  Neck: Normal range of motion. Neck supple. No thyromegaly present.  Cardiovascular: Normal rate and regular rhythm.   Pulmonary/Chest: Effort normal and breath sounds normal.  Abdominal: Soft. Bowel sounds are normal.  Musculoskeletal:  On wheelchair, can move all 4 extremities  Lymphadenopathy:    He has no cervical adenopathy.  Neurological: He is alert.  Oriented to self only  Skin: Skin is warm and dry. He is not diaphoretic.  Psychiatric:  Pleasantly confused    Labs reviewed:  Recent Labs  06/30/16 1420 07/01/16 0354 01/26/17  NA 137 136 138  K 3.9 3.7 4.8  CL 104 109  --   CO2 26 22  --   GLUCOSE 107* 92  --   BUN 22* 17 24*  CREATININE 0.90 0.86 1.3  CALCIUM  8.7* 7.9*  --     Recent Labs  06/30/16 1420 01/26/17  AST 18 21  ALT 12* 13  ALKPHOS 79 84  BILITOT 1.1  --   PROT 6.9  --   ALBUMIN 3.6  --     Recent Labs  06/30/16 1420 07/01/16 0354 01/26/17  WBC 12.7* 11.8* 8.6  NEUTROABS  --  8.4*  --   HGB 12.5* 10.8* 13.8  HCT 37.9* 32.6* 42  MCV 92.0 91.8  --   PLT 168 165 192   Lab Results  Component Value Date   TSH 1.83 03/19/2017   No results found for: HGBA1C Lab Results  Component Value Date   CHOL 173 11/12/2015   HDL 44 11/12/2015   LDLCALC 109 11/12/2015   TRIG 102 11/12/2015    Significant Diagnostic Results in last 30 days:  No results found.  Assessment/Plan  Daytime sleepiness No signs of infection on exam. Possible iatrogenic with him on trazodone. Decrease this to 25 mg every other day x 2 weeks and then stop and monitor. Encourage to be out of bed.  Insomnia Sleeping well at night, GDR of trazodone as  above and monitor. Consider melatonin if needed in future.    Family/ staff Communication: reviewed care plan with patient and charge nurse.    Labs/tests ordered:  None   I spent 30 minutes in total face-to-face time with the patient, more than 50% of which was spent in counseling and coordination of care, reviewing medication and discussing or reviewing the care plan with patient and charge nurse.   Blanchie Serve, MD Internal Medicine [facility] [address] [place], [place] [address] Cell Phone (Monday-Friday 8 am - 5 pm): [phone] On Call: [phone] and follow prompts after 5 pm and on weekends Office Phone: [phone] Office Fax: [phone]

## 2017-05-28 ENCOUNTER — Other Ambulatory Visit: Payer: Self-pay | Admitting: *Deleted

## 2017-05-31 DIAGNOSIS — R509 Fever, unspecified: Secondary | ICD-10-CM | POA: Diagnosis not present

## 2017-05-31 DIAGNOSIS — M545 Low back pain: Secondary | ICD-10-CM | POA: Diagnosis not present

## 2017-05-31 DIAGNOSIS — N400 Enlarged prostate without lower urinary tract symptoms: Secondary | ICD-10-CM | POA: Diagnosis not present

## 2017-05-31 DIAGNOSIS — J189 Pneumonia, unspecified organism: Secondary | ICD-10-CM | POA: Diagnosis not present

## 2017-05-31 LAB — CBC AND DIFFERENTIAL
HEMATOCRIT: 39 — AB (ref 41–53)
Hemoglobin: 13.4 — AB (ref 13.5–17.5)
Platelets: 193 (ref 150–399)
WBC: 10.5

## 2017-06-01 LAB — CBC AND DIFFERENTIAL
HCT: 39 — AB (ref 41–53)
HEMOGLOBIN: 13.4 — AB (ref 13.5–17.5)
PLATELETS: 193 (ref 150–399)
WBC: 10.5

## 2017-06-02 ENCOUNTER — Encounter: Payer: Self-pay | Admitting: *Deleted

## 2017-06-03 ENCOUNTER — Non-Acute Institutional Stay (SKILLED_NURSING_FACILITY): Payer: PPO | Admitting: Family

## 2017-06-03 ENCOUNTER — Encounter: Payer: Self-pay | Admitting: Family

## 2017-06-03 DIAGNOSIS — R319 Hematuria, unspecified: Secondary | ICD-10-CM

## 2017-06-03 DIAGNOSIS — N400 Enlarged prostate without lower urinary tract symptoms: Secondary | ICD-10-CM | POA: Diagnosis not present

## 2017-06-03 NOTE — Progress Notes (Signed)
Location:  [facility] Room Number: 7 Place of Service:  SNF (31) Provider: Rayen Dafoe FNP-C  Blanchie Serve, MD  Patient Care Team: Blanchie Serve, MD as PCP - General (Internal Medicine) Melina Modena, [facility] [name], MD as Consulting Physician (Ophthalmology) [name], MD as Consulting Physician (Gastroenterology) [name], MD as Consulting Physician (Urology) [name], MD as Consulting Physician (Dermatology) Crickett Abbett, Nelda Bucks, NP as Nurse Practitioner (Family Medicine)  Extended Emergency Contact Information Primary Emergency Contact: Javid,Barbara Address: [address]          [place], [place] [address] [place] Phone: [phone] Relation: Spouse Secondary Emergency Contact: Shattuck,Lynn  [place] Mobile Phone: [phone] Relation: Daughter  Code Status:  DNR Goals of care: Advanced Directive information Advanced Directives 06/03/2017  Does Patient Have a Medical Advance Directive? Yes  Type of Advance Directive Out of facility DNR (pink MOST or yellow form);Living will;Healthcare Power of Attorney  Does patient want to make changes to medical advance directive? -  Copy of [place] in Chart? Yes  Pre-existing out of facility DNR order (yellow form or pink MOST form) Yellow form placed in chart (order not valid for inpatient use);Pink MOST form placed in chart (order not valid for inpatient use)     Chief Complaint  Patient presents with  . Acute Visit    hematuria    HPI:  Pt is a 83 y.o. male seen today at [facility] for an acute visit for evaluation of hematuria. He has a significant medical history of Nodular prostate. He is seen in his room today. Facility Nurse reports patient had an episode of blood in the urine dripping from urethral opening and also noted on his briefs on 05/31/2017.He had CBC/diff and U/A and C/s ordered. Lab results  showed no drop in Hgb and WBC within normal limits. U/A showed cloudy urine, nitrite negative and occult blood 3+. Urine culture showed 50,000 -100,000 colonies of proteus mirabilis. He denies any urinary urgency,dysuria,flank pain, fever or chills. Facility staff reports no further blood from urethral.No recent weight changes reported.     Past Medical History:  Diagnosis Date  . Colon tumor 1985  . Elevated prostate specific antigen (PSA) 05/13/2011  . Herpes zoster 05/13/2011  . Herpes zoster with ophthalmic complication 11/01/4823  . Hypertrophy of prostate without urinary obstruction and other lower urinary tract symptoms (LUTS) 09/20/2010  . Hypothyroid 01/29/2017  . Memory loss 03/25/2009  . 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 Nodular prostate without urinary obstruction 05/18/2012  . Osteoarthrosis, unspecified whether generalized or localized, unspecified site 09/20/2010  . Other and unspecified hyperlipidemia 09/20/2010  . Personal history of colonic polyps 09/20/2010  . Unspecified constipation 09/24/2010  . Unspecified glaucoma(365.9) 09/20/2010   bilateral   Past Surgical History:  Procedure Laterality Date  . APPENDECTOMY  1940  . CATARACT EXTRACTION W/ INTRAOCULAR LENS IMPLANT Right 2010   Dr. Ellie Lunch  . COLECTOMY  07/17/1997   sigmoid submucosal lipoma Dr. Oletta Lamas  . COLONOSCOPY  2000   normal  . COLONOSCOPY  04/21/2002   polypectomy  . COLONOSCOPY  06/10/2004   no polyps  . SKIN CANCER EXCISION Right 05/19/2013   ear Dr. Dannette Barbara  . TONSILLECTOMY  1938  . TUMOR REMOVAL  1993   Removal of Benign Intestinal Tumor   . VASECTOMY  1972    No Known Allergies  Outpatient Encounter Prescriptions as of 06/03/2017  Medication Sig  . acetaminophen (TYLENOL) 650 MG CR tablet Take 1,300 mg by mouth at bedtime.  Marland Kitchen albuterol (PROVENTIL) (2.5 MG/3ML) 0.083% nebulizer solution Take 3 mLs (2.5 mg total) by nebulization every 4 (four) hours as needed for wheezing or  shortness of breath.  . bisacodyl (DULCOLAX) 10 MG suppository Place 10 mg rectally every other day. As needed  . dorzolamide-timolol (COSOPT) 22.3-6.8 MG/ML ophthalmic solution Place 1 drop into both eyes 2 (two) times daily. 8PM and 8PM  . finasteride (PROSCAR) 5 MG tablet Take 5 mg by mouth daily.   . hydrocortisone cream 1 % Apply 1 application topically as needed for itching.  . latanoprost (XALATAN) 0.005 % ophthalmic solution Place 1 drop into both eyes at bedtime.   . Multiple Vitamins-Minerals (CENTRUM) tablet Take 1 tablet by mouth daily.  . polyethylene glycol (MIRALAX / GLYCOLAX) packet Take 17 g by mouth daily as needed for moderate constipation.  . senna-docusate (SENOKOT-S) 8.6-50 MG tablet Take 1 tablet by mouth 2 (two) times daily.   . tamsulosin (FLOMAX) 0.4 MG CAPS capsule Take 0.4 mg by mouth daily.   Marland Kitchen zinc oxide 20 % ointment Apply 1 application topically 3 (three) times daily as needed. Apply to buttocks   No facility-administered encounter medications on file as of 06/03/2017.     Review of Systems  Constitutional: Negative for activity change, appetite change, chills, fatigue and fever.  Respiratory: Negative for cough, chest tightness, shortness of breath and wheezing.   Cardiovascular: Negative for chest pain, palpitations and leg swelling.  Gastrointestinal: Negative for abdominal distention, abdominal pain, blood in stool, constipation, diarrhea, nausea, rectal pain and vomiting.  Genitourinary: Negative for dysuria, flank pain and urgency.       Hematuria per HPI none reported since 05/31/2017.   Musculoskeletal: Positive for gait problem. Negative for back pain.  Skin: Negative for color change, pallor and rash.  Neurological: Negative for dizziness, light-headedness and headaches.  Hematological: Does not bruise/bleed easily.  Psychiatric/Behavioral: Negative for agitation and confusion.   Immunization History  Administered Date(s) Administered  . Influenza  Whole 06/29/2012, 06/30/2013  . Influenza-Unspecified 07/13/2014, 07/10/2015, 07/10/2016  . PPD Test 07/02/2016, 07/16/2016  . Pneumococcal Conjugate-13 05/20/2016  . Pneumococcal Polysaccharide-23 09/30/1995  . Td 09/29/1994  . Tdap 05/04/2017  . Zoster 09/30/2007  . Zoster Recombinat (Shingrix) 05/21/2017   Pertinent  Health Maintenance Due  Topic Date Due  . INFLUENZA VACCINE  04/29/2017  . PNA vac Low Risk Adult  Completed   Fall Risk  05/01/2017 07/03/2016 05/20/2016 02/05/2016 11/20/2015  Falls in the past year? Yes Yes Yes Yes Yes  Number falls in past yr: 2 or more 2 or more 2 or more 2 or more 1  Comment - - 10 times in past 5 months - -  Injury with Fall? No Yes No Yes Yes  Comment - 07/03/16 laceration forehead, subdural hematome - - scape on elbow  Risk Factor Category  - - High Fall Risk High Fall Risk -  Risk for fall due to : - - - History of fall(s);Impaired mobility -    Vitals:   06/03/17 0903  BP: 116/65  Pulse: 68  Resp: 18  Temp: (!) 97.5 F (36.4 C)  SpO2: 95%  Weight: 161 lb 9.6 oz (73.3 kg)  Height: 5\' 8"  (1.727 m)   Body mass index is 24.57 kg/m. Physical Exam  Constitutional: He is oriented to person, place, and time. He appears well-developed and well-nourished.  Elderly in no  acute distress  HENT:  Head: Normocephalic.  Mouth/Throat: Oropharynx is clear and moist. No oropharyngeal exudate.  Eyes: Conjunctivae and EOM are normal. Right eye exhibits no discharge. Left eye exhibits no discharge. No scleral icterus.  Neck: Normal range of motion. No JVD present. No thyromegaly present.  Cardiovascular: Normal rate, regular rhythm, normal heart sounds and intact distal pulses.  Exam reveals no gallop and no friction rub.   No murmur heard. Pulmonary/Chest: Effort normal and breath sounds normal. No respiratory distress. He has no wheezes. He has no rales.  Abdominal: Soft. Bowel sounds are normal. He exhibits no distension. There is no tenderness. There  is no rebound and no guarding.  Musculoskeletal: He exhibits no tenderness.  Unsteady gait self propel on wheelchair. Negative for flank tenderness. Bilateral lower extremities Ted hose in place.    Lymphadenopathy:    He has no cervical adenopathy.  Neurological: He is oriented to person, place, and time. Coordination normal.  Skin: Skin is warm and dry. No rash noted. No erythema. No pallor.  Psychiatric: He has a normal mood and affect.   Labs reviewed:  Recent Labs  06/30/16 1420 07/01/16 0354 01/26/17  NA 137 136 138  K 3.9 3.7 4.8  CL 104 109  --   CO2 26 22  --   GLUCOSE 107* 92  --   BUN 22* 17 24*  CREATININE 0.90 0.86 1.3  CALCIUM 8.7* 7.9*  --     Recent Labs  06/30/16 1420 01/26/17  AST 18 21  ALT 12* 13  ALKPHOS 79 84  BILITOT 1.1  --   PROT 6.9  --   ALBUMIN 3.6  --     Recent Labs  06/30/16 1420 07/01/16 0354 01/26/17 05/31/17  WBC 12.7* 11.8* 8.6 10.5  NEUTROABS  --  8.4*  --   --   HGB 12.5* 10.8* 13.8 13.4*  HCT 37.9* 32.6* 42 39*  MCV 92.0 91.8  --   --   PLT 168 165 192 193   Lab Results  Component Value Date   TSH 1.83 03/19/2017   No results found for: HGBA1C Lab Results  Component Value Date   CHOL 173 11/12/2015   HDL 44 11/12/2015   LDLCALC 109 11/12/2015   TRIG 102 11/12/2015    Significant Diagnostic Results in last 30 days:  No results found.  Assessment/Plan 1. Benign prostatic hyperplasia without lower urinary tract symptoms No urinary retention reported. Continue on Tamsulosin 0.4 mg capsule daily and finasteride 5 mg tablet daily.continue to monitor.    2. Hematuria  Afebrile.U/A showed cloudy urine, nitrite negative and occult blood 3+. Urine culture showed 50,000 -100,000 colonies of proteus mirabilis. He denies any urinary urgency,dysuria,flank pain, fever or chills. Facility staff reports no further blood from urethral. Lab results negative for infectious etiology.Will continue to monitor for now.  Family/ staff  Communication: Reviewed plan of care with patient and facility Nurse.   Labs/tests ordered: None   Ahmed Inniss C Sofia Jaquith, NP

## 2017-06-05 ENCOUNTER — Non-Acute Institutional Stay (SKILLED_NURSING_FACILITY): Payer: PPO | Admitting: Internal Medicine

## 2017-06-05 ENCOUNTER — Encounter: Payer: Self-pay | Admitting: Internal Medicine

## 2017-06-05 DIAGNOSIS — R319 Hematuria, unspecified: Secondary | ICD-10-CM | POA: Diagnosis not present

## 2017-06-05 DIAGNOSIS — K5909 Other constipation: Secondary | ICD-10-CM

## 2017-06-05 DIAGNOSIS — R1314 Dysphagia, pharyngoesophageal phase: Secondary | ICD-10-CM | POA: Diagnosis not present

## 2017-06-05 DIAGNOSIS — F0390 Unspecified dementia without behavioral disturbance: Secondary | ICD-10-CM

## 2017-06-05 DIAGNOSIS — N400 Enlarged prostate without lower urinary tract symptoms: Secondary | ICD-10-CM | POA: Diagnosis not present

## 2017-06-05 NOTE — Progress Notes (Signed)
Location:  [place] Room Number: 7 Place of Service:  SNF [phone]) Provider:  Blanchie Serve MD  Blanchie Serve, MD  Patient Care Team: Blanchie Serve, MD as PCP - General (Internal Medicine) Melina Modena, [facility] [name], MD as Consulting Physician (Ophthalmology) [name], MD as Consulting Physician (Gastroenterology) [name], MD as Consulting Physician (Urology) [name], MD as Consulting Physician (Dermatology) Ngetich, Nelda Bucks, NP as Nurse Practitioner [facility])  Extended Emergency Contact Information Primary Emergency Contact: Milby,Barbara Address: [address]          [place], [place] [address] [place] Phone: [phone] Relation: Spouse Secondary Emergency Contact: [place] Mobile Phone: [phone] Relation: Daughter  Code Status:  DNR Goals of care: Advanced Directive information Advanced Directives 06/05/2017  Does Patient Have a Medical Advance Directive? Yes  Type of Advance Directive Living will;Out of facility DNR (pink MOST or yellow form);Healthcare Power of Attorney  Does patient want to make changes to medical advance directive? No - Patient declined  Copy of [place] in Chart? Yes  Pre-existing out of facility DNR order (yellow form or pink MOST form) Yellow form placed in chart (order not valid for inpatient use);Pink MOST form placed in chart (order not valid for inpatient use)     Chief Complaint  Patient presents with  . Medical Management of Chronic Issues    Routine Visit     HPI:   83 y.o. male seen today for medical management of chronic diseases. He is sitting on his wheelchair. He had hematuria on 05/31/17 and has history of nodular prostate. Patient has dementia. His wife/HCPOA has expressed to nursing staff that she does not want any hospitalization or further workup for hematuria. This co-relates with his  MOST form. He has dysphagia and is on nectar thick fluid and pureed diet. He is working with restorative therapy team.    Past Medical History:  Diagnosis Date  . Colon tumor 1985  . Elevated prostate specific antigen (PSA) 05/13/2011  . Herpes zoster 05/13/2011  . Herpes zoster with ophthalmic complication 0/10/5425  . Hypertrophy of prostate without urinary obstruction and other lower urinary tract symptoms (LUTS) 09/20/2010  . Hypothyroid 01/29/2017  . Memory loss 03/25/2009  . 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 Nodular prostate without urinary obstruction 05/18/2012  . Osteoarthrosis, unspecified whether generalized or localized, unspecified site 09/20/2010  . Other and unspecified hyperlipidemia 09/20/2010  . Personal history of colonic polyps 09/20/2010  . Unspecified constipation 09/24/2010  . Unspecified glaucoma(365.9) 09/20/2010   bilateral   Past Surgical History:  Procedure Laterality Date  . APPENDECTOMY  1940  . CATARACT EXTRACTION W/ INTRAOCULAR LENS IMPLANT Right 2010   Dr. Ellie Lunch  . COLECTOMY  07/17/1997   sigmoid submucosal lipoma Dr. Oletta Lamas  . COLONOSCOPY  2000   normal  . COLONOSCOPY  04/21/2002   polypectomy  . COLONOSCOPY  06/10/2004   no polyps  . SKIN CANCER EXCISION Right 05/19/2013   ear Dr. Dannette Barbara  . TONSILLECTOMY  1938  . TUMOR REMOVAL  1993   Removal of Benign Intestinal Tumor   . VASECTOMY  1972    No Known Allergies  Outpatient Encounter Prescriptions as of 06/05/2017  Medication Sig  . acetaminophen (TYLENOL) 650 MG CR tablet Take 1,300 mg by mouth at bedtime.  Marland Kitchen albuterol (PROVENTIL) (2.5 MG/3ML) 0.083% nebulizer solution Take 3 mLs (2.5 mg total) by nebulization every  4 (four) hours as needed for wheezing or shortness of breath.  . bisacodyl (DULCOLAX) 10 MG suppository Place 10 mg rectally every other day. As needed  . dorzolamide-timolol (COSOPT) 22.3-6.8 MG/ML ophthalmic solution Place 1 drop into both eyes 2 (two)  times daily. 8PM and 8PM  . finasteride (PROSCAR) 5 MG tablet Take 5 mg by mouth daily.   . hydrocortisone cream 1 % Apply 1 application topically as needed for itching.  . latanoprost (XALATAN) 0.005 % ophthalmic solution Place 1 drop into both eyes at bedtime.   . Multiple Vitamins-Minerals (CENTRUM) tablet Take 1 tablet by mouth daily.  . polyethylene glycol (MIRALAX / GLYCOLAX) packet Take 17 g by mouth daily as needed for moderate constipation.  . senna-docusate (SENOKOT-S) 8.6-50 MG tablet Take 1 tablet by mouth 2 (two) times daily.   . tamsulosin (FLOMAX) 0.4 MG CAPS capsule Take 0.4 mg by mouth daily.   Marland Kitchen zinc oxide 20 % ointment Apply 1 application topically 3 (three) times daily as needed. Apply to buttocks   No facility-administered encounter medications on file as of 06/05/2017.     Review of Systems  Constitutional: Negative for appetite change and fever.  HENT: Positive for trouble swallowing and voice change. Negative for congestion, mouth sores, rhinorrhea and sore throat.   Eyes:       History of glaucoma  Respiratory: Negative for cough and shortness of breath.   Cardiovascular: Positive for leg swelling. Negative for chest pain.  Gastrointestinal: Negative for abdominal pain, constipation, diarrhea, nausea and vomiting.  Genitourinary: Negative for dysuria.  Musculoskeletal: Positive for arthralgias.       Chronic leg edema  Skin: Negative for rash.  Neurological: Negative for dizziness, tremors, numbness and headaches.  Psychiatric/Behavioral: Positive for confusion. Negative for behavioral problems.    Immunization History  Administered Date(s) Administered  . Influenza Whole 06/29/2012, 06/30/2013  . Influenza-Unspecified 07/13/2014, 07/10/2015, 07/10/2016  . PPD Test 07/02/2016, 07/16/2016  . Pneumococcal Conjugate-13 05/20/2016  . Pneumococcal Polysaccharide-23 09/30/1995  . Td 09/29/1994  . Tdap 05/04/2017  . Zoster 09/30/2007  . Zoster Recombinat  (Shingrix) 05/21/2017   Pertinent  Health Maintenance Due  Topic Date Due  . INFLUENZA VACCINE  06/29/2017 (Originally 04/29/2017)  . PNA vac Low Risk Adult  Completed   Fall Risk  05/01/2017 07/03/2016 05/20/2016 02/05/2016 11/20/2015  Falls in the past year? Yes Yes Yes Yes Yes  Number falls in past yr: 2 or more 2 or more 2 or more 2 or more 1  Comment - - 10 times in past 5 months - -  Injury with Fall? No Yes No Yes Yes  Comment - 07/03/16 laceration forehead, subdural hematome - - scape on elbow  Risk Factor Category  - - High Fall Risk High Fall Risk -  Risk for fall due to : - - - History of fall(s);Impaired mobility -   Functional Status Survey:    Vitals:   06/05/17 1054  BP: 116/65  Pulse: 68  Resp: 18  Temp: (!) 97.5 F (36.4 C)  TempSrc: Oral  SpO2: 95%  Weight: 161 lb 9.6 oz (73.3 kg)  Height: 5\' 8"  (1.727 m)   Body mass index is 24.57 kg/m.   Wt Readings from Last 3 Encounters:  06/05/17 161 lb 9.6 oz (73.3 kg)  06/03/17 161 lb 9.6 oz (73.3 kg)  05/11/17 161 lb 9.6 oz (73.3 kg)   Physical Exam  Constitutional: He appears well-developed and well-nourished. No distress.  HENT:  Head: Normocephalic  and atraumatic.  Mouth/Throat: Oropharynx is clear and moist.  Eyes: Pupils are equal, round, and reactive to light.  Has corrective glasses  Neck: Normal range of motion. Neck supple.  Cardiovascular: Normal rate and regular rhythm.   Pulmonary/Chest: Effort normal. No respiratory distress. He has no wheezes. He has rales.  Rales at lung bases  Abdominal: Soft. Bowel sounds are normal. There is no tenderness. There is no guarding.  Musculoskeletal: Normal range of motion. He exhibits edema.  On wheelchair and has to be wheeled, trace leg edema, can move all 4 extremities, limited left shoulder ROM  Lymphadenopathy:    He has no cervical adenopathy.  Neurological: He is alert.  Oriented to self and place but not to time and person  Skin: Skin is warm and dry. He  is not diaphoretic.  Chronic skin changes to his legs  Psychiatric: He has a normal mood and affect.    Labs reviewed:  Recent Labs  06/30/16 1420 07/01/16 0354 01/26/17  NA 137 136 138  K 3.9 3.7 4.8  CL 104 109  --   CO2 26 22  --   GLUCOSE 107* 92  --   BUN 22* 17 24*  CREATININE 0.90 0.86 1.3  CALCIUM 8.7* 7.9*  --     Recent Labs  06/30/16 1420 01/26/17  AST 18 21  ALT 12* 13  ALKPHOS 79 84  BILITOT 1.1  --   PROT 6.9  --   ALBUMIN 3.6  --     Recent Labs  06/30/16 1420 07/01/16 0354 01/26/17 05/31/17  WBC 12.7* 11.8* 8.6 10.5  NEUTROABS  --  8.4*  --   --   HGB 12.5* 10.8* 13.8 13.4*  HCT 37.9* 32.6* 42 39*  MCV 92.0 91.8  --   --   PLT 168 165 192 193   Lab Results  Component Value Date   TSH 1.83 03/19/2017   No results found for: HGBA1C Lab Results  Component Value Date   CHOL 173 11/12/2015   HDL 44 11/12/2015   LDLCALC 109 11/12/2015   TRIG 102 11/12/2015    Significant Diagnostic Results in last 30 days:  No results found.  Assessment/Plan  BPH Continue finasteride and flomax for now. Monitor clinically.   Hematuria None this visit. Monitor clinically. Goal is for comfort care without any hospitalization Dysphagia With dementia, he is a high risk for aspiration. Aspiration precautions. Puree feed with nectar thick liquid.   Chronic constipation Continue senna docusate and prn miralax for now, monitor  Dementia No behavioral disturbance. Supportive care, fall precautions, pressure ulcer prophylaxis  I Family/ staff Communication: reviewed care plan with patient and charge nurse.    Labs/tests ordered:  none   Blanchie Serve, MD Internal Medicine [facility] [address] [place],  [address] Cell Phone (Monday-Friday 8 am - 5 pm): [phone] On Call: [phone] and follow prompts after 5 pm and on weekends Office Phone: [phone] Office Fax: [phone]

## 2017-06-15 ENCOUNTER — Non-Acute Institutional Stay (SKILLED_NURSING_FACILITY): Payer: PPO | Admitting: Internal Medicine

## 2017-06-15 ENCOUNTER — Encounter: Payer: Self-pay | Admitting: Internal Medicine

## 2017-06-15 DIAGNOSIS — G8929 Other chronic pain: Secondary | ICD-10-CM | POA: Diagnosis not present

## 2017-06-15 DIAGNOSIS — M25512 Pain in left shoulder: Secondary | ICD-10-CM

## 2017-06-15 NOTE — Progress Notes (Signed)
Location:  [place] Room Number: 7 Place of Service:  SNF [phone]) Provider:  Blanchie Serve MD  Blanchie Serve, MD  Patient Care Team: Blanchie Serve, MD as PCP - General (Internal Medicine) Melina Modena, [facility] [name], MD as Consulting Physician (Ophthalmology) [name], MD as Consulting Physician (Gastroenterology) [name], MD as Consulting Physician (Urology) [name], MD as Consulting Physician (Dermatology) Ngetich, Nelda Bucks, NP as Nurse Practitioner [facility])  Extended Emergency Contact Information Primary Emergency Contact: Colledge,Barbara Address: [address]          [place],  [address] [place] Phone: [phone] Relation: Spouse Secondary Emergency Contact: Winnifred [place] Mobile Phone: [phone] Relation: Daughter  Code Status:  DNR  Goals of care: Advanced Directive information Advanced Directives 06/15/2017  Does Patient Have a Medical Advance Directive? Yes  Type of Advance Directive Living will;Out of facility DNR (pink MOST or yellow form)  Does patient want to make changes to medical advance directive? No - Patient declined  Copy of [place] in Chart? No - copy requested  Pre-existing out of facility DNR order (yellow form or pink MOST form) Yellow form placed in chart (order not valid for inpatient use);Pink MOST form placed in chart (order not valid for inpatient use)     Chief Complaint  Patient presents with  . Acute Visit    Left shoulder pain    HPI:   83 y.o. male seen today for acute visit for worsening left shoulder pain. He is seen in his room with his wife present. He denies any pain to left shoulder. Per wife, he has no pain with rest but he is noted to holler with ROM activities. He has history of OA. He is sitting on his wheelchair.    Past Medical History:  Diagnosis Date  . Colon tumor 1985  .  Elevated prostate specific antigen (PSA) 05/13/2011  . Herpes zoster 05/13/2011  . Herpes zoster with ophthalmic complication 02/27/4430  . Hypertrophy of prostate without urinary obstruction and other lower urinary tract symptoms (LUTS) 09/20/2010  . Hypothyroid 01/29/2017  . Memory loss 03/25/2009  . 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 Nodular prostate without urinary obstruction 05/18/2012  . Osteoarthrosis, unspecified whether generalized or localized, unspecified site 09/20/2010  . Other and unspecified hyperlipidemia 09/20/2010  . Personal history of colonic polyps 09/20/2010  . Unspecified constipation 09/24/2010  . Unspecified glaucoma(365.9) 09/20/2010   bilateral   Past Surgical History:  Procedure Laterality Date  . APPENDECTOMY  1940  . CATARACT EXTRACTION W/ INTRAOCULAR LENS IMPLANT Right 2010   Dr. Ellie Lunch  . COLECTOMY  07/17/1997   sigmoid submucosal lipoma Dr. Oletta Lamas  . COLONOSCOPY  2000   normal  . COLONOSCOPY  04/21/2002   polypectomy  . COLONOSCOPY  06/10/2004   no polyps  . SKIN CANCER EXCISION Right 05/19/2013   ear Dr. Dannette Barbara  . TONSILLECTOMY  1938  . TUMOR REMOVAL  1993   Removal of Benign Intestinal Tumor   . VASECTOMY  1972    No Known Allergies  Outpatient Encounter Prescriptions as of 06/15/2017  Medication Sig  . acetaminophen (TYLENOL) 650 MG CR tablet Take 1,300 mg by mouth at bedtime.  Marland Kitchen albuterol (PROVENTIL) (2.5 MG/3ML) 0.083% nebulizer solution Take 3 mLs (2.5 mg total) by nebulization every 4 (four) hours as needed for wheezing or shortness of breath.  . bisacodyl (DULCOLAX) 10 MG suppository Place  10 mg rectally every other day. As needed  . dorzolamide-timolol (COSOPT) 22.3-6.8 MG/ML ophthalmic solution Place 1 drop into both eyes 2 (two) times daily. 8PM and 8PM  . finasteride (PROSCAR) 5 MG tablet Take 5 mg by mouth daily.   . hydrocortisone cream 1 % Apply 1 application topically as needed for itching.  . latanoprost  (XALATAN) 0.005 % ophthalmic solution Place 1 drop into both eyes at bedtime.   . Multiple Vitamins-Minerals (CENTRUM) tablet Take 1 tablet by mouth daily.  . polyethylene glycol (MIRALAX / GLYCOLAX) packet Take 17 g by mouth daily as needed for moderate constipation.  . senna-docusate (SENOKOT-S) 8.6-50 MG tablet Take 1 tablet by mouth 2 (two) times daily.   . tamsulosin (FLOMAX) 0.4 MG CAPS capsule Take 0.4 mg by mouth daily.   Marland Kitchen zinc oxide 20 % ointment Apply 1 application topically 3 (three) times daily as needed. Apply to buttocks   No facility-administered encounter medications on file as of 06/15/2017.     Review of Systems  Constitutional: Negative for chills and fever.  Eyes:       History of glaucoma  Musculoskeletal: Positive for arthralgias and gait problem. Negative for joint swelling and neck pain.       Chronic leg edema, uses wheelchair and has to be wheeled. No fall reported. No injury to left shoulder. Has history of arthritis  Skin: Negative for rash.  Psychiatric/Behavioral: Positive for confusion. Negative for behavioral problems.    Immunization History  Administered Date(s) Administered  . Influenza Whole 06/29/2012, 06/30/2013  . Influenza-Unspecified 07/13/2014, 07/10/2015, 07/10/2016  . PPD Test 07/02/2016, 07/16/2016  . Pneumococcal Conjugate-13 05/20/2016  . Pneumococcal Polysaccharide-23 09/30/1995  . Td 09/29/1994  . Tdap 05/04/2017  . Zoster 09/30/2007  . Zoster Recombinat (Shingrix) 05/21/2017   Pertinent  Health Maintenance Due  Topic Date Due  . INFLUENZA VACCINE  06/29/2017 (Originally 04/29/2017)  . PNA vac Low Risk Adult  Completed   Fall Risk  05/01/2017 07/03/2016 05/20/2016 02/05/2016 11/20/2015  Falls in the past year? Yes Yes Yes Yes Yes  Number falls in past yr: 2 or more 2 or more 2 or more 2 or more 1  Comment - - 10 times in past 5 months - -  Injury with Fall? No Yes No Yes Yes  Comment - 07/03/16 laceration forehead, subdural hematome - -  scape on elbow  Risk Factor Category  - - High Fall Risk High Fall Risk -  Risk for fall due to : - - - History of fall(s);Impaired mobility -   Functional Status Survey:    Vitals:   06/15/17 1142  BP: 129/72  Pulse: 84  Resp: 18  Temp: (!) 97.5 F (36.4 C)  TempSrc: Oral  SpO2: 97%  Weight: 163 lb 9.6 oz (74.2 kg)  Height: 5\' 8"  (1.727 m)   Body mass index is 24.88 kg/m.   Wt Readings from Last 3 Encounters:  06/15/17 163 lb 9.6 oz (74.2 kg)  06/05/17 161 lb 9.6 oz (73.3 kg)  06/03/17 161 lb 9.6 oz (73.3 kg)   Physical Exam  Constitutional: He appears well-developed and well-nourished. No distress.  HENT:  Head: Normocephalic and atraumatic.  Eyes: Pupils are equal, round, and reactive to light.  Has corrective glasses  Neck: Normal range of motion. Neck supple.  Cardiovascular: Normal rate and regular rhythm.   Pulmonary/Chest: Effort normal. No respiratory distress. He has no wheezes. He has rales.  Rales at lung bases  Abdominal: Soft. Bowel  sounds are normal.  Musculoskeletal: He exhibits edema.  On wheelchair and has to be wheeled, trace leg edema, can move all 4 extremities, limited left shoulder ROM with abduction and external rotation limited, no joint swelling noted, good radial pulse  Lymphadenopathy:    He has no cervical adenopathy.  Neurological: He is alert.  Oriented to self and place but not to time and person  Skin: Skin is warm and dry. He is not diaphoretic.  Chronic skin changes to his legs  Psychiatric: He has a normal mood and affect.    Labs reviewed:  Recent Labs  06/30/16 1420 07/01/16 0354 01/26/17  NA 137 136 138  K 3.9 3.7 4.8  CL 104 109  --   CO2 26 22  --   GLUCOSE 107* 92  --   BUN 22* 17 24*  CREATININE 0.90 0.86 1.3  CALCIUM 8.7* 7.9*  --     Recent Labs  06/30/16 1420 01/26/17  AST 18 21  ALT 12* 13  ALKPHOS 79 84  BILITOT 1.1  --   PROT 6.9  --   ALBUMIN 3.6  --     Recent Labs  06/30/16 1420  07/01/16 0354 01/26/17 05/31/17 06/01/17  WBC 12.7* 11.8* 8.6 10.5 10.5  NEUTROABS  --  8.4*  --   --   --   HGB 12.5* 10.8* 13.8 13.4* 13.4*  HCT 37.9* 32.6* 42 39* 39*  MCV 92.0 91.8  --   --   --   PLT 168 165 192 193 193   Lab Results  Component Value Date   TSH 1.83 03/19/2017   No results found for: HGBA1C Lab Results  Component Value Date   CHOL 173 11/12/2015   HDL 44 11/12/2015   LDLCALC 109 11/12/2015   TRIG 102 11/12/2015    Significant Diagnostic Results in last 30 days:  No results found.  Assessment/Plan  Left shoulder pain Appears to be from his severe OA and is chronic. Good radial pulse. No signs of infection to his shoulder joint. No symptom or signs of fracture on exam. Currently on tylenol arthritis 1300 mg daily at bedtime. Add biofreeze gel 4% bid for now. No old xray of left shoulder to review. Obtain xray if no improvement.  I Family/ staff Communication: reviewed care plan with patient, his wife and charge nurse.    Labs/tests ordered:  none   Blanchie Serve, MD Internal Medicine [facility] [address] [place], [place] [address] Cell Phone (Monday-Friday 8 am - 5 pm): [phone] On Call: [phone] and follow prompts after 5 pm and on weekends Office Phone: [phone] Office Fax: [phone]

## 2017-07-06 ENCOUNTER — Non-Acute Institutional Stay (SKILLED_NURSING_FACILITY): Payer: PPO | Admitting: Family

## 2017-07-06 ENCOUNTER — Encounter: Payer: Self-pay | Admitting: Family

## 2017-07-06 DIAGNOSIS — F0390 Unspecified dementia without behavioral disturbance: Secondary | ICD-10-CM

## 2017-07-06 DIAGNOSIS — M159 Polyosteoarthritis, unspecified: Secondary | ICD-10-CM

## 2017-07-06 DIAGNOSIS — N400 Enlarged prostate without lower urinary tract symptoms: Secondary | ICD-10-CM

## 2017-07-06 DIAGNOSIS — M150 Primary generalized (osteo)arthritis: Secondary | ICD-10-CM

## 2017-07-06 DIAGNOSIS — K5901 Slow transit constipation: Secondary | ICD-10-CM

## 2017-07-06 NOTE — Progress Notes (Signed)
Location:  [facility] Room Number: 7 Place of Service:  SNF ([phone]) Provider:  Ladislao Cohenour, Nelda Bucks, NP  Blanchie Serve, MD  Patient Care Team: Blanchie Serve, MD as PCP - General (Internal Medicine) Melina Modena, [facility] [name], MD as Consulting Physician (Ophthalmology) [name], MD as Consulting Physician (Gastroenterology) [name], MD as Consulting Physician (Urology) [name], MD as Consulting Physician (Dermatology) Kelvin Burpee, Nelda Bucks, NP as Nurse Practitioner [facility] Medicine)  Extended Emergency Contact Information Primary Emergency Contact: Dave,Barbara Address: [address]          [place], [place] [address] [place] Phone: [phone] Relation: Spouse Secondary Emergency Contact: Shattuck,Lynn  [place] Mobile Phone: [phone] Relation: Daughter  Code Status: DNR Goals of care: Advanced Directive information Advanced Directives 07/06/2017  Does Patient Have a Medical Advance Directive? Yes  Type of Advance Directive Living will;Out of facility DNR (pink MOST or yellow form)  Does patient want to make changes to medical advance directive? No - Patient declined  Copy of [place] in Chart? -  Pre-existing out of facility DNR order (yellow form or pink MOST form) Pink MOST form placed in chart (order not valid for inpatient use);Yellow form placed in chart (order not valid for inpatient use)     Chief Complaint  Patient presents with  . Medical Management of Chronic Issues    HPI:  Pt is a 83 y.o. male seen at [facility] today for medical management of chronic diseases.He has a medical history of dyslipidemia, CKD,BPH, dementia without behavioral disturbances,failure to thrive, SCC, OA among other conditions.He is seen in his room today.He denies any acute issues this visit.Facility Nurse reports no new concerns. No recent weight changes or fall  episodes.He continues to self propel on wheelchair.    Past Medical History:  Diagnosis Date  . Colon tumor 1985  . Elevated prostate specific antigen (PSA) 05/13/2011  . Herpes zoster 05/13/2011  . Herpes zoster with ophthalmic complication 06/01/4267  . Hypertrophy of prostate without urinary obstruction and other lower urinary tract symptoms (LUTS) 09/20/2010  . Hypothyroid 01/29/2017  . Memory loss 03/25/2009  . 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 Nodular prostate without urinary obstruction 05/18/2012  . Osteoarthrosis, unspecified whether generalized or localized, unspecified site 09/20/2010  . Other and unspecified hyperlipidemia 09/20/2010  . Personal history of colonic polyps 09/20/2010  . Unspecified constipation 09/24/2010  . Unspecified glaucoma(365.9) 09/20/2010   bilateral   Past Surgical History:  Procedure Laterality Date  . APPENDECTOMY  1940  . CATARACT EXTRACTION W/ INTRAOCULAR LENS IMPLANT Right 2010   Dr. Ellie Lunch  . COLECTOMY  07/17/1997   sigmoid submucosal lipoma Dr. Oletta Lamas  . COLONOSCOPY  2000   normal  . COLONOSCOPY  04/21/2002   polypectomy  . COLONOSCOPY  06/10/2004   no polyps  . SKIN CANCER EXCISION Right 05/19/2013   ear Dr. Dannette Barbara  . TONSILLECTOMY  1938  . TUMOR REMOVAL  1993   Removal of Benign Intestinal Tumor   . VASECTOMY  1972    No Known Allergies  Outpatient Encounter Prescriptions as of 07/06/2017  Medication Sig  . acetaminophen (TYLENOL) 650 MG CR tablet Take 1,300 mg by mouth at bedtime.  Marland Kitchen albuterol (PROVENTIL) (2.5 MG/3ML) 0.083% nebulizer solution Take 3 mLs (2.5 mg total) by nebulization every 4 (four) hours as needed for wheezing or shortness of breath.  . bisacodyl (DULCOLAX) 10 MG suppository Place  10 mg rectally every other day. As needed  . dorzolamide-timolol (COSOPT) 22.3-6.8 MG/ML ophthalmic solution Place 1 drop into both eyes 2 (two) times daily. 8PM and 8PM  . finasteride (PROSCAR) 5 MG tablet Take 5  mg by mouth daily.   . hydrocortisone cream 1 % Apply 1 application topically as needed for itching.  . latanoprost (XALATAN) 0.005 % ophthalmic solution Place 1 drop into both eyes at bedtime.   . Menthol, Topical Analgesic, (BIOFREEZE) 4 % GEL Apply 1 application topically 2 (two) times daily. Apply to the left shoulder  . Multiple Vitamins-Minerals (CENTRUM) tablet Take 1 tablet by mouth daily.  . polyethylene glycol (MIRALAX / GLYCOLAX) packet Take 17 g by mouth daily as needed for moderate constipation.  . senna-docusate (SENOKOT-S) 8.6-50 MG tablet Take 1 tablet by mouth 2 (two) times daily.   . tamsulosin (FLOMAX) 0.4 MG CAPS capsule Take 0.4 mg by mouth daily.   Marland Kitchen zinc oxide 20 % ointment Apply 1 application topically 3 (three) times daily as needed. Apply to buttocks   No facility-administered encounter medications on file as of 07/06/2017.     Review of Systems  Constitutional: Negative for activity change, appetite change, chills, fatigue and fever.  HENT: Positive for trouble swallowing. Negative for congestion, rhinorrhea, sinus pain, sinus pressure, sneezing and sore throat.   Eyes: Negative for pain, discharge and redness.  Respiratory: Negative for cough, chest tightness and wheezing.   Cardiovascular: Positive for leg swelling. Negative for chest pain and palpitations.  Gastrointestinal: Negative for abdominal distention, abdominal pain, constipation, nausea and vomiting.  Endocrine: Negative for cold intolerance, heat intolerance, polydipsia and polyuria.  Genitourinary: Negative for dysuria, flank pain, hematuria and urgency.  Musculoskeletal: Positive for arthralgias and gait problem.  Skin: Negative for color change, pallor and rash.  Neurological: Negative for dizziness, syncope, light-headedness, numbness and headaches.  Hematological: Does not bruise/bleed easily.  Psychiatric/Behavioral: Negative for agitation, confusion, hallucinations and sleep disturbance. The  patient is not nervous/anxious.     Immunization History  Administered Date(s) Administered  . Influenza Whole 06/29/2012, 06/30/2013  . Influenza-Unspecified 07/13/2014, 07/10/2015, 07/10/2016  . PPD Test 07/02/2016, 07/16/2016  . Pneumococcal Conjugate-13 05/20/2016  . Pneumococcal Polysaccharide-23 09/30/1995  . Td 09/29/1994  . Tdap 05/04/2017  . Zoster 09/30/2007  . Zoster Recombinat (Shingrix) 05/21/2017   Pertinent  Health Maintenance Due  Topic Date Due  . INFLUENZA VACCINE  04/29/2017  . PNA vac Low Risk Adult  Completed   Fall Risk  05/01/2017 07/03/2016 05/20/2016 02/05/2016 11/20/2015  Falls in the past year? Yes Yes Yes Yes Yes  Number falls in past yr: 2 or more 2 or more 2 or more 2 or more 1  Comment - - 10 times in past 5 months - -  Injury with Fall? No Yes No Yes Yes  Comment - 07/03/16 laceration forehead, subdural hematome - - scape on elbow  Risk Factor Category  - - High Fall Risk High Fall Risk -  Risk for fall due to : - - - History of fall(s);Impaired mobility -   Functional Status Survey:    Vitals:   07/06/17 1019  BP: 138/76  Pulse: 71  Resp: 20  Temp: 97.9 F (36.6 C)  SpO2: 94%  Weight: 163 lb (73.9 kg)  Height: 5\' 8"  (1.727 m)   Body mass index is 24.78 kg/m. Physical Exam  Constitutional: He appears well-developed and well-nourished. No distress.  Elderly   HENT:  Head: Normocephalic.  Right Ear: External  ear normal.  Mouth/Throat: Oropharynx is clear and moist. No oropharyngeal exudate.  Low tone voice   Eyes: Pupils are equal, round, and reactive to light. Conjunctivae and EOM are normal. Right eye exhibits no discharge. Left eye exhibits no discharge. No scleral icterus.  Neck: Normal range of motion. No JVD present. No thyromegaly present.  Cardiovascular: Normal rate, regular rhythm, normal heart sounds and intact distal pulses.  Exam reveals no gallop and no friction rub.   No murmur heard. Pulmonary/Chest: Effort normal and  breath sounds normal. No respiratory distress. He has no wheezes. He has no rales.  Abdominal: Soft. Bowel sounds are normal. He exhibits no distension. There is no tenderness. There is no rebound and no guarding.  Musculoskeletal: He exhibits no tenderness.  Moves x 4 extremities. Bilateral chronic leg edema.   Lymphadenopathy:    He has no cervical adenopathy.  Neurological: Coordination normal.  Alert and oriented to person and place but disoriented to time and date  Skin: Skin is warm and dry. No rash noted. No erythema. No pallor.  Psychiatric: He has a normal mood and affect.   Labs reviewed:  Recent Labs  01/26/17  NA 138  K 4.8  BUN 24*  CREATININE 1.3    Recent Labs  01/26/17  AST 21  ALT 13  ALKPHOS 84    Recent Labs  01/26/17 05/31/17 06/01/17  WBC 8.6 10.5 10.5  HGB 13.8 13.4* 13.4*  HCT 42 39* 39*  PLT 192 193 193   Lab Results  Component Value Date   TSH 1.83 03/19/2017   Lab Results  Component Value Date   CHOL 173 11/12/2015   HDL 44 11/12/2015   LDLCALC 109 11/12/2015   TRIG 102 11/12/2015   Significant Diagnostic Results in last 30 days:  No results found.  Assessment/Plan Constipation Current regimen effective. Continue to encourage oral intake and hydration.   BPH Asymptomatic.Continue on Tamsulosin 0.4 mg capsule daily and finasteride 5 mg tablet daily.continue to monitor.   OA Continue on tylenol 650 mg tablet 1300 mg at bedtime.continue to encourage participation in facility activities.Fall and safety precautions.Continue to monitor.  Dementia  No new behavioral issues reported.Assist with ADL's as needed.Continue to monitor.  Family/ staff Communication: Reviewed plan of care with patient and facility Nurse.   Labs/tests ordered:None

## 2017-07-31 ENCOUNTER — Non-Acute Institutional Stay (SKILLED_NURSING_FACILITY): Payer: PPO | Admitting: Internal Medicine

## 2017-07-31 ENCOUNTER — Encounter: Payer: Self-pay | Admitting: Internal Medicine

## 2017-07-31 DIAGNOSIS — N183 Chronic kidney disease, stage 3 unspecified: Secondary | ICD-10-CM

## 2017-07-31 DIAGNOSIS — S61412D Laceration without foreign body of left hand, subsequent encounter: Secondary | ICD-10-CM | POA: Diagnosis not present

## 2017-07-31 DIAGNOSIS — R1314 Dysphagia, pharyngoesophageal phase: Secondary | ICD-10-CM

## 2017-07-31 DIAGNOSIS — Z23 Encounter for immunization: Secondary | ICD-10-CM | POA: Diagnosis not present

## 2017-07-31 DIAGNOSIS — M19012 Primary osteoarthritis, left shoulder: Secondary | ICD-10-CM | POA: Diagnosis not present

## 2017-07-31 DIAGNOSIS — N400 Enlarged prostate without lower urinary tract symptoms: Secondary | ICD-10-CM | POA: Diagnosis not present

## 2017-07-31 NOTE — Progress Notes (Signed)
Location:  [facility] Room Number: 7 Place of Service:  SNF [phone]) Provider:  Blanchie Serve MD  Blanchie Serve, MD  Patient Care Team: Blanchie Serve, MD as PCP - General (Internal Medicine) Melina Modena, [facility] [name], MD as Consulting Physician (Ophthalmology) [name], MD as Consulting Physician (Gastroenterology) [name], MD as Consulting Physician (Urology) [name], MD as Consulting Physician (Dermatology) Ngetich, Nelda Bucks, NP as Nurse Practitioner [facility] Medicine)  Extended Emergency Contact Information Primary Emergency Contact: Mogan,Barbara Address: [address]          [place], [place] [address] [place] Phone: [phone] Relation: Spouse Secondary Emergency Contact: Winnifred Friar [place] Mobile Phone: [phone] Relation: Daughter  Code Status:  DNR  Goals of care: Advanced Directive information Advanced Directives 07/31/2017  Does Patient Have a Medical Advance Directive? Yes  Type of Advance Directive Living will;Out of facility DNR (pink MOST or yellow form)  Does patient want to make changes to medical advance directive? No - Patient declined  Copy of [place] in Chart? -  Pre-existing out of facility DNR order (yellow form or pink MOST form) Yellow form placed in chart (order not valid for inpatient use);Pink MOST form placed in chart (order not valid for inpatient use)     Chief Complaint  Patient presents with  . Medical Management of Chronic Issues    Routine Visit     HPI:  Pt is a 83 y.o. male seen today for medical management of chronic diseases. He was noted to have skin tear to his hand yesterday. No fall reported.   BPH - Has urinary incontinence. Currently on finasteride and flomax.   Dysphagia- high aspiration risk. On dysphagia diet and gets supervision with meals. Occasional cough.  Left shoulder OA- with limited  movement. On tylenol 1300 mg qhs at present. Also on biofreeze gel. Working with OT 2 times a week for ROM exercises   Easy bruising- thin skin, skin tear to left hand. geri sleeves present.   Shingles prophylaxis- Received shingrix 07/22/17.   Past Medical History:  Diagnosis Date  . Colon tumor 1985  . Elevated prostate specific antigen (PSA) 05/13/2011  . Herpes zoster 05/13/2011  . Herpes zoster with ophthalmic complication 0/04/6577  . Hypertrophy of prostate without urinary obstruction and other lower urinary tract symptoms (LUTS) 09/20/2010  . Hypothyroid 01/29/2017  . Memory loss 03/25/2009  . 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 Nodular prostate without urinary obstruction 05/18/2012  . Osteoarthrosis, unspecified whether generalized or localized, unspecified site 09/20/2010  . Other and unspecified hyperlipidemia 09/20/2010  . Personal history of colonic polyps 09/20/2010  . Unspecified constipation 09/24/2010  . Unspecified glaucoma(365.9) 09/20/2010   bilateral   Past Surgical History:  Procedure Laterality Date  . APPENDECTOMY  1940  . CATARACT EXTRACTION W/ INTRAOCULAR LENS IMPLANT Right 2010   Dr. Ellie Lunch  . COLECTOMY  07/17/1997   sigmoid submucosal lipoma Dr. Oletta Lamas  . COLONOSCOPY  2000   normal  . COLONOSCOPY  04/21/2002   polypectomy  . COLONOSCOPY  06/10/2004   no polyps  . SKIN CANCER EXCISION Right 05/19/2013   ear Dr. Dannette Barbara  . TONSILLECTOMY  1938  . TUMOR REMOVAL  1993   Removal of Benign Intestinal Tumor   . VASECTOMY  1972    No Known Allergies  Outpatient Encounter Prescriptions as of 07/31/2017  Medication Sig  . acetaminophen (TYLENOL) 650 MG  CR tablet Take 1,300 mg by mouth at bedtime.  Marland Kitchen albuterol (PROVENTIL) (2.5 MG/3ML) 0.083% nebulizer solution Take 3 mLs (2.5 mg total) by nebulization every 4 (four) hours as needed for wheezing or shortness of breath.  . bisacodyl (DULCOLAX) 10 MG suppository Place 10 mg rectally every  other day. As needed  . dorzolamide-timolol (COSOPT) 22.3-6.8 MG/ML ophthalmic solution Place 1 drop into both eyes 2 (two) times daily. 8PM and 8PM  . finasteride (PROSCAR) 5 MG tablet Take 5 mg by mouth daily.   . hydrocortisone cream 1 % Apply 1 application topically as needed for itching.  . latanoprost (XALATAN) 0.005 % ophthalmic solution Place 1 drop into both eyes at bedtime.   . Menthol, Topical Analgesic, (BIOFREEZE) 4 % GEL Apply 1 application topically 2 (two) times daily. Apply to the left shoulder  . Multiple Vitamins-Minerals (CENTRUM) tablet Take 1 tablet by mouth daily.  . polyethylene glycol (MIRALAX / GLYCOLAX) packet Take 17 g by mouth daily as needed for moderate constipation.  . senna-docusate (SENOKOT-S) 8.6-50 MG tablet Take 1 tablet by mouth 2 (two) times daily.   . tamsulosin (FLOMAX) 0.4 MG CAPS capsule Take 0.4 mg by mouth daily.   Marland Kitchen zinc oxide 20 % ointment Apply 1 application topically 3 (three) times daily as needed. Apply to buttocks   No facility-administered encounter medications on file as of 07/31/2017.     Review of Systems  Reason unable to perform ROS: limited due to dementia.  Constitutional: Negative for appetite change and fever.  HENT: Positive for hearing loss and trouble swallowing. Negative for congestion, ear pain, mouth sores, rhinorrhea and sore throat.   Eyes: Positive for visual disturbance.  Respiratory: Positive for cough. Negative for shortness of breath.   Cardiovascular: Positive for leg swelling. Negative for chest pain and palpitations.  Gastrointestinal: Negative for abdominal pain, blood in stool, nausea and vomiting.  Genitourinary: Negative for dysuria.  Musculoskeletal: Positive for arthralgias and gait problem.       Uses wheelchair and self propels, needs assistance with transfer.   Neurological: Negative for dizziness and headaches.  Hematological: Bruises/bleeds easily.  Psychiatric/Behavioral: Positive for confusion.     Immunization History  Administered Date(s) Administered  . Influenza Whole 06/29/2012, 06/30/2013  . Influenza-Unspecified 07/13/2014, 07/10/2015, 07/10/2016, 07/09/2017  . PPD Test 07/02/2016, 07/16/2016  . Pneumococcal Conjugate-13 05/20/2016  . Pneumococcal Polysaccharide-23 09/30/1995  . Td 09/29/1994  . Tdap 05/04/2017  . Zoster 09/30/2007  . Zoster Recombinat (Shingrix) 07/22/2017   Pertinent  Health Maintenance Due  Topic Date Due  . INFLUENZA VACCINE  Completed  . PNA vac Low Risk Adult  Completed   Fall Risk  05/01/2017 07/03/2016 05/20/2016 02/05/2016 11/20/2015  Falls in the past year? Yes Yes Yes Yes Yes  Number falls in past yr: 2 or more 2 or more 2 or more 2 or more 1  Comment - - 10 times in past 5 months - -  Injury with Fall? No Yes No Yes Yes  Comment - 07/03/16 laceration forehead, subdural hematome - - scape on elbow  Risk Factor Category  - - High Fall Risk High Fall Risk -  Risk for fall due to : - - - History of fall(s);Impaired mobility -   Functional Status Survey:    Vitals:   07/31/17 0957  BP: (!) 144/78  Pulse: 68  Resp: 18  Temp: 98 F (36.7 C)  TempSrc: Oral  SpO2: 94%  Weight: 163 lb 6.4 oz (74.1 kg)  Height:  5\' 8"  (1.727 m)   Body mass index is 24.84 kg/m.   Wt Readings from Last 3 Encounters:  07/31/17 163 lb 6.4 oz (74.1 kg)  07/06/17 163 lb (73.9 kg)  06/15/17 163 lb 9.6 oz (74.2 kg)   Physical Exam  Constitutional: He appears well-developed and well-nourished. No distress.  HENT:  Head: Normocephalic and atraumatic.  Mouth/Throat: Oropharynx is clear and moist. No oropharyngeal exudate.  Eyes: Pupils are equal, round, and reactive to light. EOM are normal. Right eye exhibits no discharge. Left eye exhibits no discharge.  Has corrective glasses  Neck: Normal range of motion. Neck supple.  Cardiovascular: Normal rate and regular rhythm.   Pulmonary/Chest: Effort normal. No respiratory distress.  Decreased air entry to lung  bases, no added sounds  Abdominal: Soft. Bowel sounds are normal.  Musculoskeletal: He exhibits edema.  Ted hose in place. Can move all 4 extremities. Limited left shoulder ROM. On wheelchair and self propels. Unsteady gait.   Lymphadenopathy:    He has no cervical adenopathy.  Neurological: He is alert.  Oriented to self, place and time  Skin: Skin is warm and dry. He is not diaphoretic.  Left hand skin tear with dressing in place. Bruising to arms. geri sleeves in place  Psychiatric: He has a normal mood and affect.    Labs reviewed:  Recent Labs  01/26/17  NA 138  K 4.8  BUN 24*  CREATININE 1.3    Recent Labs  01/26/17  AST 21  ALT 13  ALKPHOS 84    Recent Labs  01/26/17 05/31/17 06/01/17  WBC 8.6 10.5 10.5  HGB 13.8 13.4* 13.4*  HCT 42 39* 39*  PLT 192 193 193   Lab Results  Component Value Date   TSH 1.83 03/19/2017   No results found for: HGBA1C Lab Results  Component Value Date   CHOL 173 11/12/2015   HDL 44 11/12/2015   LDLCALC 109 11/12/2015   TRIG 102 11/12/2015    Significant Diagnostic Results in last 30 days:  No results found.  Assessment/Plan  Left shoulder OA Continue to work with OT and RT. Continue tylenol 1300 mg daily and biofreeze gel. ROM exercises as tolerated. check LFT with him on tylenol.   BPH No further hematuria reported. Continue finasteride and flomax.   Dysphagia Pharyngoesophageal. Aspiration precautions, dysphagia diet. Remains a high risk for aspiration  Left hand skin tear Has steri strips and telfa dressing in place. Monitor clinically. geri sleeves to prevent further injury. Skin care  Shingles prophylaxis uptodate with shingrix 07/22/17.   Immunization History  Administered Date(s) Administered  . Influenza Whole 06/29/2012, 06/30/2013  . Influenza-Unspecified 07/13/2014, 07/10/2015, 07/10/2016, 07/09/2017  . PPD Test 07/02/2016, 07/16/2016  . Pneumococcal Conjugate-13 05/20/2016  . Pneumococcal  Polysaccharide-23 09/30/1995  . Td 09/29/1994  . Tdap 05/04/2017  . Zoster 09/30/2007  . Zoster Recombinat (Shingrix) 07/22/2017   Influenza prophylaxis Received influenza vaccine 07/09/17. Tolerated well.   CKD stage 3 Reviewed last BMP with GFR calculated at 47 ml/min. Check bmp.     Family/ staff Communication: reviewed care plan with patient and charge nurse.    Labs/tests ordered:  CMP   Blanchie Serve, MD Internal Medicine [facility] [address] [place], [place] [address] Cell Phone (Monday-Friday 8 am - 5 pm): [phone] On Call: [phone] and follow prompts after 5 pm and on weekends Office Phone: [phone] Office Fax: [phone]

## 2017-08-03 DIAGNOSIS — N183 Chronic kidney disease, stage 3 (moderate): Secondary | ICD-10-CM | POA: Diagnosis not present

## 2017-08-03 LAB — BASIC METABOLIC PANEL
BUN: 23 — AB (ref 4–21)
CREATININE: 1 (ref 0.6–1.3)
Glucose: 87
Potassium: 4.1 (ref 3.4–5.3)
Sodium: 136 — AB (ref 137–147)

## 2017-08-03 LAB — ALBUMIN: Albumin: 3.5

## 2017-08-03 LAB — HEPATIC FUNCTION PANEL
ALK PHOS: 74 (ref 25–125)
ALT: 12 (ref 10–40)
AST: 17 (ref 14–40)
Bilirubin, Total: 0.5

## 2017-08-03 LAB — PROTEIN, TOTAL: TOTAL PROTEIN: 6 g/dL

## 2017-08-03 LAB — CALCIUM: CALCIUM: 8.6

## 2017-08-04 ENCOUNTER — Encounter: Payer: Self-pay | Admitting: *Deleted

## 2017-08-28 ENCOUNTER — Encounter: Payer: Self-pay | Admitting: Family

## 2017-08-28 ENCOUNTER — Non-Acute Institutional Stay (SKILLED_NURSING_FACILITY): Payer: PPO | Admitting: Family

## 2017-08-28 DIAGNOSIS — R1314 Dysphagia, pharyngoesophageal phase: Secondary | ICD-10-CM

## 2017-08-28 NOTE — Progress Notes (Signed)
Location:  [facility] Room Number: 7 Place of Service:  SNF (31) Provider: Ereka Brau FNP-C  Blanchie Serve, MD  Patient Care Team: Blanchie Serve, MD as PCP - General (Internal Medicine) Melina Modena, [facility] [name], MD as Consulting Physician (Ophthalmology) [name], MD as Consulting Physician (Gastroenterology) [name], MD as Consulting Physician (Urology) [name], MD as Consulting Physician (Dermatology) Valeriano Bain, Nelda Bucks, NP as Nurse Practitioner (Family Medicine)  Extended Emergency Contact Information Primary Emergency Contact: Goodbar,Barbara Address: [address]          [place], [place] [address] [place] Phone: [phone] Relation: Spouse Secondary Emergency Contact: Shattuck,Lynn  [place] Mobile Phone: [phone] Relation: Daughter  Code Status:  DNR Goals of care: Advanced Directive information Advanced Directives 08/28/2017  Does Patient Have a Medical Advance Directive? Yes  Type of Paramedic of [place];Out of facility DNR (pink MOST or yellow form);Living will  Does patient want to make changes to medical advance directive? -  Copy of [place] in Chart? Yes  Pre-existing out of facility DNR order (yellow form or pink MOST form) Yellow form placed in chart (order not valid for inpatient use);Pink MOST form placed in chart (order not valid for inpatient use)     Chief Complaint  Patient presents with  . Acute Visit    coughing and swallowing issues    HPI:  Pt is a 83 y.o. male seen today at [facility] for an acute visit for evaluation of coughing with swallowing.He is seen in his room today.Facility Nurse reports patient tends to eat very fast and coughs during meals.He denies any difficulties swallowing. He is currently on a nectar thick puree meat and mechanical soft for the rest of the meal.He denies  any fever, chills, wheezing or shortness of breath.       Past Medical History:  Diagnosis Date  . Colon tumor 1985  . Elevated prostate specific antigen (PSA) 05/13/2011  . Herpes zoster 05/13/2011  . Herpes zoster with ophthalmic complication 05/06/4165  . Hypertrophy of prostate without urinary obstruction and other lower urinary tract symptoms (LUTS) 09/20/2010  . Hypothyroid 01/29/2017  . Memory loss 03/25/2009  . 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 Nodular prostate without urinary obstruction 05/18/2012  . Osteoarthrosis, unspecified whether generalized or localized, unspecified site 09/20/2010  . Other and unspecified hyperlipidemia 09/20/2010  . Personal history of colonic polyps 09/20/2010  . Unspecified constipation 09/24/2010  . Unspecified glaucoma(365.9) 09/20/2010   bilateral   Past Surgical History:  Procedure Laterality Date  . APPENDECTOMY  1940  . CATARACT EXTRACTION W/ INTRAOCULAR LENS IMPLANT Right 2010   Dr. Ellie Lunch  . COLECTOMY  07/17/1997   sigmoid submucosal lipoma Dr. Oletta Lamas  . COLONOSCOPY  2000   normal  . COLONOSCOPY  04/21/2002   polypectomy  . COLONOSCOPY  06/10/2004   no polyps  . SKIN CANCER EXCISION Right 05/19/2013   ear Dr. Dannette Barbara  . TONSILLECTOMY  1938  . TUMOR REMOVAL  1993   Removal of Benign Intestinal Tumor   . VASECTOMY  1972    No Known Allergies  Outpatient Encounter Medications as of 08/28/2017  Medication Sig  . acetaminophen (TYLENOL) 650 MG CR tablet Take 1,300 mg by mouth at bedtime.  Marland Kitchen albuterol (PROVENTIL) (2.5 MG/3ML) 0.083% nebulizer solution Take 3 mLs (2.5 mg total) by nebulization every 4 (four) hours as needed for wheezing or  shortness of breath.  . bisacodyl (DULCOLAX) 10 MG suppository Place 10 mg rectally every other day. As needed  . dorzolamide-timolol (COSOPT) 22.3-6.8 MG/ML ophthalmic solution Place 1 drop into both eyes 2 (two) times daily. 8PM and 8PM  . finasteride (PROSCAR) 5 MG tablet Take  5 mg by mouth daily.   . hydrocortisone cream 1 % Apply 1 application topically as needed for itching.  . latanoprost (XALATAN) 0.005 % ophthalmic solution Place 1 drop into both eyes at bedtime.   . Menthol, Topical Analgesic, (BIOFREEZE) 4 % GEL Apply 1 application topically 2 (two) times daily. Apply to the left shoulder  . Multiple Vitamins-Minerals (CENTRUM) tablet Take 1 tablet by mouth daily.  . polyethylene glycol (MIRALAX / GLYCOLAX) packet Take 17 g by mouth daily as needed for moderate constipation.  . senna-docusate (SENOKOT-S) 8.6-50 MG tablet Take 1 tablet by mouth 2 (two) times daily.   . tamsulosin (FLOMAX) 0.4 MG CAPS capsule Take 0.4 mg by mouth daily.   Marland Kitchen zinc oxide 20 % ointment Apply 1 application topically 3 (three) times daily as needed. Apply to buttocks   No facility-administered encounter medications on file as of 08/28/2017.     Review of Systems  Constitutional: Negative for activity change, appetite change, chills, fatigue and fever.  HENT: Positive for trouble swallowing. Negative for congestion, rhinorrhea, sinus pressure, sinus pain, sneezing and sore throat.   Eyes: Negative for redness, itching and visual disturbance.  Respiratory: Negative for chest tightness, shortness of breath and wheezing.        Coughing with swallowing  Cardiovascular: Positive for leg swelling. Negative for chest pain and palpitations.  Gastrointestinal: Negative for abdominal distention, abdominal pain, constipation, diarrhea, nausea and vomiting.  Skin: Negative for color change, pallor and rash.  Psychiatric/Behavioral: Negative for agitation, confusion and sleep disturbance. The patient is not nervous/anxious.     Immunization History  Administered Date(s) Administered  . Influenza Whole 06/29/2012, 06/30/2013  . Influenza-Unspecified 07/13/2014, 07/10/2015, 07/10/2016, 07/09/2017  . PPD Test 07/02/2016, 07/16/2016  . Pneumococcal Conjugate-13 05/20/2016  . Pneumococcal  Polysaccharide-23 09/30/1995  . Td 09/29/1994  . Tdap 05/04/2017  . Zoster 09/30/2007  . Zoster Recombinat (Shingrix) 07/22/2017   Pertinent  Health Maintenance Due  Topic Date Due  . INFLUENZA VACCINE  Completed  . PNA vac Low Risk Adult  Completed   Fall Risk  05/01/2017 07/03/2016 05/20/2016 02/05/2016 11/20/2015  Falls in the past year? Yes Yes Yes Yes Yes  Number falls in past yr: 2 or more 2 or more 2 or more 2 or more 1  Comment - - 10 times in past 5 months - -  Injury with Fall? No Yes No Yes Yes  Comment - 07/03/16 laceration forehead, subdural hematome - - scape on elbow  Risk Factor Category  - - High Fall Risk High Fall Risk -  Risk for fall due to : - - - History of fall(s);Impaired mobility -    Vitals:   08/28/17 1104  BP: (!) 142/82  Pulse: 76  Resp: 19  Temp: 98.4 F (36.9 C)  SpO2: 91%  Weight: 161 lb (73 kg)  Height: 5\' 8"  (1.727 m)   Body mass index is 24.48 kg/m. Physical Exam  Constitutional: He appears well-developed and well-nourished. No distress.  HENT:  Head: Normocephalic.  Right Ear: External ear normal.  Left Ear: External ear normal.  Mouth/Throat: Oropharynx is clear and moist. No oropharyngeal exudate.  Eyes: Conjunctivae and EOM are normal. Pupils are equal,  round, and reactive to light. Right eye exhibits no discharge. Left eye exhibits no discharge. No scleral icterus.  Neck: Normal range of motion. No JVD present. No thyromegaly present.  Cardiovascular: Normal rate, regular rhythm, normal heart sounds and intact distal pulses. Exam reveals no gallop and no friction rub.  No murmur heard. Pulmonary/Chest: Effort normal and breath sounds normal. No respiratory distress. He has no wheezes. He has no rales.  Abdominal: Soft. Bowel sounds are normal. He exhibits no distension. There is no tenderness. There is no rebound and no guarding.  Lymphadenopathy:    He has no cervical adenopathy.  Neurological: Coordination normal.  Alert and  oriented to person and place  Skin: Skin is warm and dry. No rash noted. No erythema.  Psychiatric: He has a normal mood and affect.    Labs reviewed: Recent Labs    01/26/17 08/03/17  NA 138 136*  K 4.8 4.1  BUN 24* 23*  CREATININE 1.3 1.0  CALCIUM  --  8.6   Recent Labs    01/26/17 08/03/17  AST 21 17  ALT 13 12  ALKPHOS 84 74  PROT  --  6.0  ALBUMIN  --  3.5   Recent Labs    01/26/17 05/31/17 06/01/17  WBC 8.6 10.5 10.5  HGB 13.8 13.4* 13.4*  HCT 42 39* 39*  PLT 192 193 193   Lab Results  Component Value Date   TSH 1.83 03/19/2017   No results found for: HGBA1C Lab Results  Component Value Date   CHOL 173 11/12/2015   HDL 44 11/12/2015   LDLCALC 109 11/12/2015   TRIG 102 11/12/2015    Significant Diagnostic Results in last 30 days:  No results found.  Assessment/Plan  Dysphagia Cough with swallowing.Tends to eat too fast per Nursing despite supervision with meals.continue on a nectar thick puree meat and mechanical soft for the rest of the meal.will have Speech Therapy to evaluate and treat as indicated. Continue on Aspiration precautions.   Family/ staff Communication: Reviewed plan of care with patient and facility Nurse supervisor  Labs/tests ordered: None   Sandrea Hughs, NP

## 2017-09-03 ENCOUNTER — Non-Acute Institutional Stay (SKILLED_NURSING_FACILITY): Payer: PPO | Admitting: Family

## 2017-09-03 ENCOUNTER — Encounter: Payer: Self-pay | Admitting: Family

## 2017-09-03 DIAGNOSIS — N400 Enlarged prostate without lower urinary tract symptoms: Secondary | ICD-10-CM

## 2017-09-03 DIAGNOSIS — F0390 Unspecified dementia without behavioral disturbance: Secondary | ICD-10-CM

## 2017-09-03 DIAGNOSIS — K5901 Slow transit constipation: Secondary | ICD-10-CM

## 2017-09-03 DIAGNOSIS — R609 Edema, unspecified: Secondary | ICD-10-CM | POA: Diagnosis not present

## 2017-09-03 DIAGNOSIS — M19012 Primary osteoarthritis, left shoulder: Secondary | ICD-10-CM

## 2017-09-03 DIAGNOSIS — R1314 Dysphagia, pharyngoesophageal phase: Secondary | ICD-10-CM | POA: Diagnosis not present

## 2017-09-03 NOTE — Progress Notes (Signed)
Location:  [facility] Room Number: 7 Place of Service:  SNF (31) Provider: [place] FNP-C   Blanchie Serve, MD  Patient Care Team: Blanchie Serve, MD as PCP - General (Internal Medicine) Melina Modena, [facility] [name], MD as Consulting Physician (Ophthalmology) [name], MD as Consulting Physician (Gastroenterology) [name], MD as Consulting Physician (Urology) [name], MD as Consulting Physician (Dermatology) Jilian West, Nelda Bucks, NP as Nurse Practitioner (Family Medicine)  Extended Emergency Contact Information Primary Emergency Contact: Bjorn,Barbara Address: [address]          [place], [place] [address] [place] Phone: [phone] Relation: Spouse Secondary Emergency Contact: Shattuck,Lynn  [place] Mobile Phone: [phone] Relation: Daughter  Code Status:  DNR Goals of care: Advanced Directive information Advanced Directives 09/03/2017  Does Patient Have a Medical Advance Directive? Yes  Type of Paramedic of [place];Out of facility DNR (pink MOST or yellow form);Living will  Does patient want to make changes to medical advance directive? -  Copy of [place] in Chart? Yes  Pre-existing out of facility DNR order (yellow form or pink MOST form) Yellow form placed in chart (order not valid for inpatient use);Pink MOST form placed in chart (order not valid for inpatient use)     Chief Complaint  Patient presents with  . Medical Management of Chronic Issues    routine visit    HPI:  Pt is a 83 y.o. male seen today [facility] for medical management of chronic diseases.He has a medical history of Dementia without behavioral disturbance,dysphagia,OA,BPH among other conditions.He is seen in his room today with facility Nurse present.He denies any acute issues this visit.he has had no recent fall episode or acute illness. He has  had a progressive weight loss wt 163.6 lbs (06/05/2017);wt 163.4 lbs (07/06/2017);wt 161 lbs (07/31/2017);wt 159.4 lbs (08/30/2017).Nurse reports patient good appetite.  Past Medical History:  Diagnosis Date  . Colon tumor 1985  . Elevated prostate specific antigen (PSA) 05/13/2011  . Herpes zoster 05/13/2011  . Herpes zoster with ophthalmic complication 03/01/9527  . Hypertrophy of prostate without urinary obstruction and other lower urinary tract symptoms (LUTS) 09/20/2010  . Hypothyroid 01/29/2017  . Memory loss 03/25/2009  . 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 Nodular prostate without urinary obstruction 05/18/2012  . Osteoarthrosis, unspecified whether generalized or localized, unspecified site 09/20/2010  . Other and unspecified hyperlipidemia 09/20/2010  . Personal history of colonic polyps 09/20/2010  . Unspecified constipation 09/24/2010  . Unspecified glaucoma(365.9) 09/20/2010   bilateral   Past Surgical History:  Procedure Laterality Date  . APPENDECTOMY  1940  . CATARACT EXTRACTION W/ INTRAOCULAR LENS IMPLANT Right 2010   Dr. Ellie Lunch  . COLECTOMY  07/17/1997   sigmoid submucosal lipoma Dr. Oletta Lamas  . COLONOSCOPY  2000   normal  . COLONOSCOPY  04/21/2002   polypectomy  . COLONOSCOPY  06/10/2004   no polyps  . SKIN CANCER EXCISION Right 05/19/2013   ear Dr. Dannette Barbara  . TONSILLECTOMY  1938  . TUMOR REMOVAL  1993   Removal of Benign Intestinal Tumor   . VASECTOMY  1972    No Known Allergies  Allergies as of 09/03/2017   No Known Allergies     Medication List        Accurate as of 09/03/17  3:47 PM. Always use your most recent med list.          acetaminophen  650 MG CR tablet Commonly known as:  TYLENOL Take 1,300 mg by mouth at bedtime.   albuterol (2.5 MG/3ML) 0.083% nebulizer solution Commonly known as:  PROVENTIL Take 3 mLs (2.5 mg total) by nebulization every 4 (four) hours as needed for wheezing or shortness of breath.   BIOFREEZE 4 %  Gel Generic drug:  Menthol (Topical Analgesic) Apply 1 application topically 2 (two) times daily. Apply to the left shoulder   bisacodyl 10 MG suppository Commonly known as:  DULCOLAX Place 10 mg rectally every other day. As needed   CENTRUM tablet Take 1 tablet by mouth daily.   dorzolamide-timolol 22.3-6.8 MG/ML ophthalmic solution Commonly known as:  COSOPT Place 1 drop into both eyes 2 (two) times daily. 8PM and 8PM   finasteride 5 MG tablet Commonly known as:  PROSCAR Take 5 mg by mouth daily.   hydrocortisone cream 1 % Apply 1 application topically as needed for itching.   latanoprost 0.005 % ophthalmic solution Commonly known as:  XALATAN Place 1 drop into both eyes at bedtime.   polyethylene glycol packet Commonly known as:  MIRALAX / GLYCOLAX Take 17 g by mouth daily as needed for moderate constipation.   senna-docusate 8.6-50 MG tablet Commonly known as:  Senokot-S Take 1 tablet by mouth 2 (two) times daily.   tamsulosin 0.4 MG Caps capsule Commonly known as:  FLOMAX Take 0.4 mg by mouth daily.   zinc oxide 20 % ointment Apply 1 application topically 3 (three) times daily as needed. Apply to buttocks       Review of Systems  Constitutional: Negative for activity change, appetite change, chills, fatigue and fever.  HENT: Positive for hearing loss. Negative for congestion, rhinorrhea, sinus pressure, sinus pain, sneezing and sore throat.   Eyes: Positive for visual disturbance. Negative for discharge and redness.       Wears eye glasses  Respiratory: Negative for cough, chest tightness, shortness of breath and wheezing.   Cardiovascular: Positive for leg swelling. Negative for chest pain and palpitations.  Gastrointestinal: Negative for abdominal distention, abdominal pain, constipation, diarrhea, nausea and vomiting.  Endocrine: Negative for cold intolerance, heat intolerance, polydipsia, polyphagia and polyuria.  Genitourinary: Negative for dysuria,  flank pain and urgency.  Musculoskeletal: Positive for arthralgias and gait problem.  Skin: Negative for color change, pallor, rash and wound.  Neurological: Negative for dizziness, seizures, syncope, light-headedness and headaches.  Hematological: Does not bruise/bleed easily.  Psychiatric/Behavioral: Negative for agitation, confusion and sleep disturbance. The patient is not nervous/anxious.     Immunization History  Administered Date(s) Administered  . Influenza Whole 06/29/2012, 06/30/2013  . Influenza-Unspecified 07/13/2014, 07/10/2015, 07/10/2016, 07/09/2017  . PPD Test 07/02/2016, 07/16/2016  . Pneumococcal Conjugate-13 05/20/2016  . Pneumococcal Polysaccharide-23 09/30/1995  . Td 09/29/1994  . Tdap 05/04/2017  . Zoster 09/30/2007  . Zoster Recombinat (Shingrix) 07/22/2017   Pertinent  Health Maintenance Due  Topic Date Due  . INFLUENZA VACCINE  Completed  . PNA vac Low Risk Adult  Completed   Fall Risk  05/01/2017 07/03/2016 05/20/2016 02/05/2016 11/20/2015  Falls in the past year? Yes Yes Yes Yes Yes  Number falls in past yr: 2 or more 2 or more 2 or more 2 or more 1  Comment - - 10 times in past 5 months - -  Injury with Fall? No Yes No Yes Yes  Comment - 07/03/16 laceration forehead, subdural hematome - - scape on elbow  Risk Factor Category  - - High Fall Risk High Fall Risk -  Risk for fall due to : - - - History of fall(s);Impaired mobility -    Vitals:   09/03/17 1004  BP: 126/64  Pulse: 68  Resp: 18  Temp: 97.9 F (36.6 C)  SpO2: 96%  Weight: 159 lb 6.4 oz (72.3 kg)  Height: 5\' 8"  (1.727 m)   Body mass index is 24.24 kg/m. Physical Exam  Constitutional: He appears well-developed. No distress.  Frail elderly  HENT:  Head: Normocephalic.  Mouth/Throat: Oropharynx is clear and moist. No oropharyngeal exudate.  HOH  Eyes: Conjunctivae and EOM are normal. Pupils are equal, round, and reactive to light. Right eye exhibits no discharge. Left eye exhibits no  discharge. No scleral icterus.  Neck: Normal range of motion. No JVD present. No thyromegaly present.  Cardiovascular: Normal rate, regular rhythm, normal heart sounds and intact distal pulses. Exam reveals no gallop and no friction rub.  No murmur heard. Pulmonary/Chest: Effort normal and breath sounds normal. No respiratory distress. He has no wheezes. He has no rales.  Abdominal: Soft. Bowel sounds are normal. He exhibits no distension. There is no tenderness. There is no rebound and no guarding.  Musculoskeletal: He exhibits no tenderness.  Moves x 4 extremities.Self propels on wheelchair.bilateral lower extremities 1-2+ edema.    Lymphadenopathy:    He has no cervical adenopathy.  Neurological: Coordination normal.  Alert and oriented to person, place but not time.   Skin: Skin is warm and dry. No rash noted. No erythema. No pallor.  Skin intact  Psychiatric: He has a normal mood and affect.   Labs reviewed: Recent Labs    01/26/17 08/03/17  NA 138 136*  K 4.8 4.1  BUN 24* 23*  CREATININE 1.3 1.0  CALCIUM  --  8.6   Recent Labs    01/26/17 08/03/17  AST 21 17  ALT 13 12  ALKPHOS 84 74  PROT  --  6.0  ALBUMIN  --  3.5   Recent Labs    01/26/17 05/31/17 06/01/17  WBC 8.6 10.5 10.5  HGB 13.8 13.4* 13.4*  HCT 42 39* 39*  PLT 192 193 193   Lab Results  Component Value Date   TSH 1.83 03/19/2017   No results found for: HGBA1C Lab Results  Component Value Date   CHOL 173 11/12/2015   HDL 44 11/12/2015   LDLCALC 109 11/12/2015   TRIG 102 11/12/2015    Significant Diagnostic Results in last 30 days:  No results found.  Assessment/Plan 1. Dysphagia, pharyngoesophageal phase On dysphagia diet.Continues to work with Speech therapy.continue on aspiration precautions.   2. Slow transit constipation Continue on senokot-S 8.6-50 mg tablet twice daily and Miralax 17 gm daily as needed.   3. Benign prostatic hyperplasia without lower urinary tract symptoms Continue  on finasteride 5 mg tablet daily and Tamsulosin 0.4 mg capsule daily.   4. Primary osteoarthritis of left shoulder Current pain regimen effective.Continue to monitor.   5. Dementia without behavioral disturbance No new behavioral issues reported. He continues to participate in facility activities. Continue to assist with ADL's.  6. Edema Bilateral lower extremities 1-2+ edema.bilateral ted hose in place. No cough, shortness of breath,rales or wheezes noted.No abrupt weight gain.continue to monitor weight.  7. Weight loss  progressive weight loss despite reported good appetite.  wt 163.6 lbs (06/05/2017) wt 163.4 lbs (07/06/2017) wt 161 lbs (07/31/2017) wt 159.4 lbs (08/30/2017)  Continue on protein supplements and MVI.continue to follow up with facility Registered Dietician.monitor weight trend.  Family/ staff Communication:  Reviewed plan of care with patient and facility Nurse supervisor   Labs/tests ordered: None   Sandrea Hughs, NP

## 2017-09-15 ENCOUNTER — Encounter: Payer: Self-pay | Admitting: Family

## 2017-09-15 ENCOUNTER — Non-Acute Institutional Stay (SKILLED_NURSING_FACILITY): Payer: PPO | Admitting: Family

## 2017-09-15 DIAGNOSIS — K0381 Cracked tooth: Secondary | ICD-10-CM | POA: Diagnosis not present

## 2017-09-15 NOTE — Progress Notes (Signed)
Location:  [facility] Room Number: 7 Place of Service:  SNF (31) Provider: Dinah Ngetich FNP-C  Blanchie Serve, MD  Patient Care Team: Blanchie Serve, MD as PCP - General (Internal Medicine) Melina Modena, [facility] [name], MD as Consulting Physician (Ophthalmology) [name], MD as Consulting Physician (Gastroenterology) [name], MD as Consulting Physician (Urology) [name], MD as Consulting Physician (Dermatology) Ngetich, Nelda Bucks, NP as Nurse Practitioner (Family Medicine)  Extended Emergency Contact Information Primary Emergency Contact: Lawley,Barbara Address: [address]          [place], [place] [address] [place] Phone: [phone] Relation: Spouse Secondary Emergency Contact: Shattuck,Lynn  [place] Mobile Phone: [phone] Relation: Daughter  Code Status:  DNR Goals of care: Advanced Directive information Advanced Directives 09/15/2017  Does Patient Have a Medical Advance Directive? Yes  Type of Paramedic of [place];Out of facility DNR (pink MOST or yellow form);Living will  Does patient want to make changes to medical advance directive? -  Copy of [place] in Chart? Yes  Pre-existing out of facility DNR order (yellow form or pink MOST form) Yellow form placed in chart (order not valid for inpatient use);Pink MOST form placed in chart (order not valid for inpatient use)     Chief Complaint  Patient presents with  . Acute Visit    missing tooth    HPI:  Pt is a 83 y.o. male seen today at [facility] for an acute visit for evaluation of missing tooth. He is seen per patient's nurse request.Nurse reports patient's wife requested patient to be evaluated. Patient's wife was flossing patient's teeth when she noticed patient's right upper back tooth missing. No recent injury or fall episode reported. He denies any  difficulties chewing,swelling or pain to tooth area. Facility Nurse states patient eating without any difficulties. No fever or chills.     Past Medical History:  Diagnosis Date  . Colon tumor 1985  . Elevated prostate specific antigen (PSA) 05/13/2011  . Herpes zoster 05/13/2011  . Herpes zoster with ophthalmic complication 0/05/9832  . Hypertrophy of prostate without urinary obstruction and other lower urinary tract symptoms (LUTS) 09/20/2010  . Hypothyroid 01/29/2017  . Memory loss 03/25/2009  . 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 Nodular prostate without urinary obstruction 05/18/2012  . Osteoarthrosis, unspecified whether generalized or localized, unspecified site 09/20/2010  . Other and unspecified hyperlipidemia 09/20/2010  . Personal history of colonic polyps 09/20/2010  . Unspecified constipation 09/24/2010  . Unspecified glaucoma(365.9) 09/20/2010   bilateral   Past Surgical History:  Procedure Laterality Date  . APPENDECTOMY  1940  . CATARACT EXTRACTION W/ INTRAOCULAR LENS IMPLANT Right 2010   Dr. Ellie Lunch  . COLECTOMY  07/17/1997   sigmoid submucosal lipoma Dr. Oletta Lamas  . COLONOSCOPY  2000   normal  . COLONOSCOPY  04/21/2002   polypectomy  . COLONOSCOPY  06/10/2004   no polyps  . SKIN CANCER EXCISION Right 05/19/2013   ear Dr. Dannette Barbara  . TONSILLECTOMY  1938  . TUMOR REMOVAL  1993   Removal of Benign Intestinal Tumor   . VASECTOMY  1972    No Known Allergies  Outpatient Encounter Medications as of 09/15/2017  Medication Sig  . acetaminophen (TYLENOL) 650 MG CR tablet Take 1,300 mg by mouth at bedtime.  Marland Kitchen albuterol (PROVENTIL) (2.5 MG/3ML) 0.083% nebulizer solution Take 3 mLs (2.5 mg total) by nebulization every 4 (four) hours  as needed for wheezing or shortness of breath.  . bisacodyl (DULCOLAX) 10 MG suppository Place 10 mg rectally every other day. As needed  . dorzolamide-timolol (COSOPT) 22.3-6.8 MG/ML ophthalmic solution Place 1 drop into both  eyes 2 (two) times daily. 8PM and 8PM  . finasteride (PROSCAR) 5 MG tablet Take 5 mg by mouth daily.   . hydrocortisone cream 1 % Apply 1 application topically as needed for itching.  . latanoprost (XALATAN) 0.005 % ophthalmic solution Place 1 drop into both eyes at bedtime.   . Menthol, Topical Analgesic, (BIOFREEZE) 4 % GEL Apply 1 application topically 2 (two) times daily. Apply to the left shoulder  . Multiple Vitamins-Minerals (CENTRUM) tablet Take 1 tablet by mouth daily.  . polyethylene glycol (MIRALAX / GLYCOLAX) packet Take 17 g by mouth daily as needed for moderate constipation.  . senna-docusate (SENOKOT-S) 8.6-50 MG tablet Take 1 tablet by mouth 2 (two) times daily.   . tamsulosin (FLOMAX) 0.4 MG CAPS capsule Take 0.4 mg by mouth daily.   Marland Kitchen zinc oxide 20 % ointment Apply 1 application topically 3 (three) times daily as needed. Apply to buttocks   No facility-administered encounter medications on file as of 09/15/2017.     Review of Systems  Constitutional: Negative for activity change, appetite change, chills, fever and unexpected weight change.  HENT: Positive for hearing loss. Negative for congestion, rhinorrhea, sinus pressure, sinus pain, sneezing, sore throat and trouble swallowing.        Missing right upper tooth per HPI   Eyes: Negative for discharge and redness.       Wears eye glasses  Respiratory: Negative for cough, chest tightness, shortness of breath and wheezing.   Musculoskeletal: Positive for gait problem.  Skin: Negative for color change, pallor and rash.  Neurological: Negative for dizziness, light-headedness and headaches.  Hematological: Does not bruise/bleed easily.  Psychiatric/Behavioral: Negative for agitation and sleep disturbance. The patient is not nervous/anxious.     Immunization History  Administered Date(s) Administered  . Influenza Whole 06/29/2012, 06/30/2013  . Influenza-Unspecified 07/13/2014, 07/10/2015, 07/10/2016, 07/09/2017  . PPD  Test 07/02/2016, 07/16/2016  . Pneumococcal Conjugate-13 05/20/2016  . Pneumococcal Polysaccharide-23 09/30/1995  . Td 09/29/1994  . Tdap 05/04/2017  . Zoster 09/30/2007  . Zoster Recombinat (Shingrix) 07/22/2017   Pertinent  Health Maintenance Due  Topic Date Due  . INFLUENZA VACCINE  Completed  . PNA vac Low Risk Adult  Completed   Fall Risk  05/01/2017 07/03/2016 05/20/2016 02/05/2016 11/20/2015  Falls in the past year? Yes Yes Yes Yes Yes  Number falls in past yr: 2 or more 2 or more 2 or more 2 or more 1  Comment - - 10 times in past 5 months - -  Injury with Fall? No Yes No Yes Yes  Comment - 07/03/16 laceration forehead, subdural hematome - - scape on elbow  Risk Factor Category  - - High Fall Risk High Fall Risk -  Risk for fall due to : - - - History of fall(s);Impaired mobility -    Vitals:   09/15/17 1211  BP: 108/72  Pulse: 72  Resp: 20  Temp: (!) 97.3 F (36.3 C)  SpO2: 92%  Weight: 159 lb 6.4 oz (72.3 kg)  Height: 5\' 8"  (1.727 m)   Body mass index is 24.24 kg/m. Physical Exam  Constitutional: He appears well-developed and well-nourished.  Elderly in no acute distress   HENT:  Head: Normocephalic.  Right Ear: External ear normal.  Left Ear: External  ear normal.  Mouth/Throat: Oropharynx is clear and moist. No oropharyngeal exudate.  Right upper molar missing with broken root in place. No tenderness,redness, swelling or drainage noted on gum.   Eyes: Conjunctivae and EOM are normal. Pupils are equal, round, and reactive to light. Right eye exhibits no discharge. Left eye exhibits no discharge. No scleral icterus.  Neck: Normal range of motion. No JVD present. No thyromegaly present.  Cardiovascular: Normal rate, regular rhythm, normal heart sounds and intact distal pulses. Exam reveals no gallop and no friction rub.  No murmur heard. Pulmonary/Chest: Effort normal and breath sounds normal. No respiratory distress. He has no wheezes. He has no rales.  Abdominal:  Soft. Bowel sounds are normal. He exhibits no distension. There is no tenderness. There is no rebound and no guarding.  Musculoskeletal: He exhibits no tenderness.  Unsteady gait uses wheelchair.bilateral lower extremities 1+ edema.   Lymphadenopathy:    He has no cervical adenopathy.  Neurological: Coordination normal.  Alert and oriented to person, place but not time.  Skin: Skin is warm and dry. No rash noted. No erythema.  Psychiatric: He has a normal mood and affect.   Labs reviewed: Recent Labs    01/26/17 08/03/17  NA 138 136*  K 4.8 4.1  BUN 24* 23*  CREATININE 1.3 1.0  CALCIUM  --  8.6   Recent Labs    01/26/17 08/03/17  AST 21 17  ALT 13 12  ALKPHOS 84 74  PROT  --  6.0  ALBUMIN  --  3.5   Recent Labs    01/26/17 05/31/17 06/01/17  WBC 8.6 10.5 10.5  HGB 13.8 13.4* 13.4*  HCT 42 39* 39*  PLT 192 193 193   Lab Results  Component Value Date   TSH 1.83 03/19/2017   No results found for: HGBA1C Lab Results  Component Value Date   CHOL 173 11/12/2015   HDL 44 11/12/2015   LDLCALC 109 11/12/2015   TRIG 102 11/12/2015    Significant Diagnostic Results in last 30 days:  No results found.  Assessment/Plan  Nontraumatic broken or cracked tooth Afebrile.Right upper molar missing with broken root in place.Unclear how long teeth has been missing.No tenderness,redness, swelling or drainage noted on gum.continue to monitor for signs of infections. Refer for Dentist to for evaluation.   Family/ staff Communication: Reviewed plan of care with patient and facility Nurse.  Labs/tests ordered: None   Dinah C Ngetich, NP

## 2017-09-18 IMAGING — CT CT CERVICAL SPINE W/O CM
4 of 8 series · 12 of 33 positions shown, 13 images · non-contrast
Comparison: None.

CLINICAL DATA: Unwitnessed fall. Unknown loss of consciousness.
History of memory loss, prostate cancer.

EXAM:
CT HEAD WITHOUT CONTRAST
CT CERVICAL SPINE WITHOUT CONTRAST
TECHNIQUE: Multidetector CT imaging of the head and cervical spine was
performed following the standard protocol without intravenous
contrast. Multiplanar CT image reconstructions of the cervical spine
were also generated.

[Series 5: coronal · coronal · 0.30mm/px · 1 of 66 slices shown]
[im 33/66  bone]
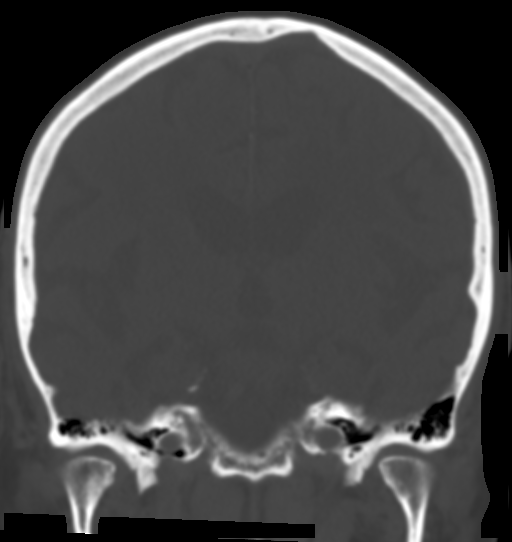

[Series 6: sagittal · sagittal · 0.32mm/px · 5 of 52 slices shown]
[im 9/52  bone]
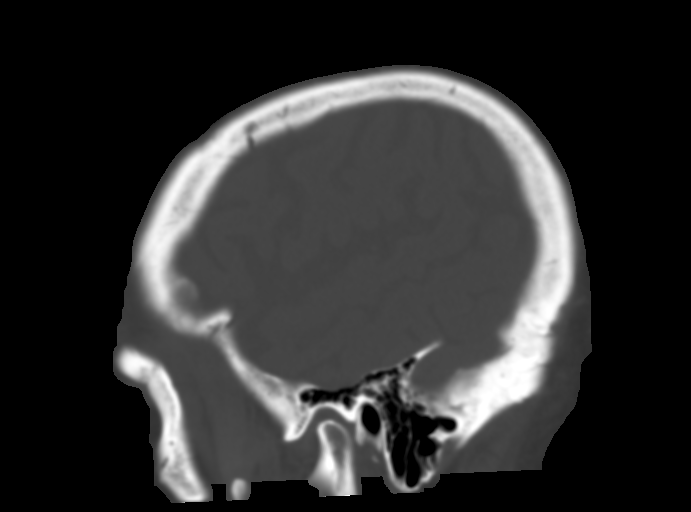
[im 18/52  bone]
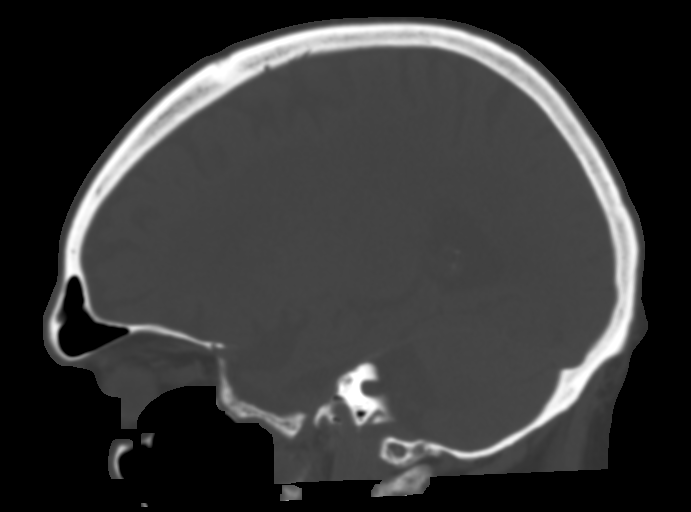
[im 26/52  bone]
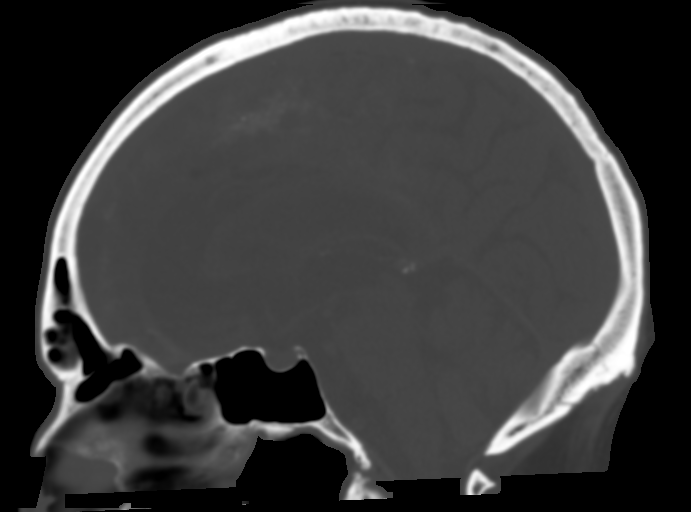
[im 35/52  bone]
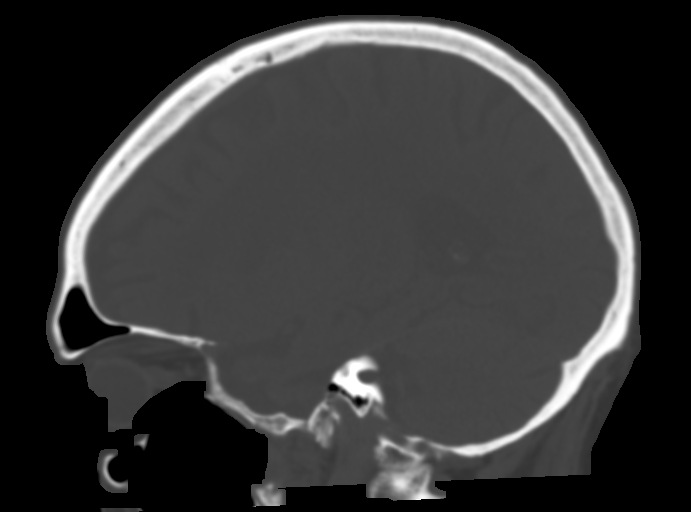
[im 43/52  bone]
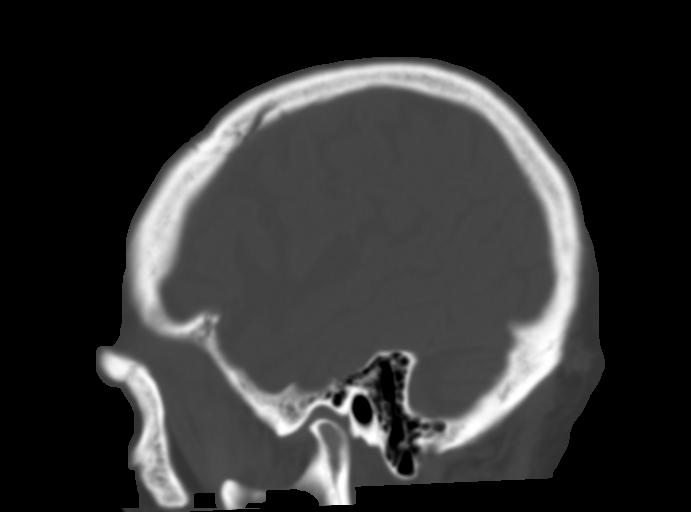

[Series 7: c-spine st · axial · 0.33mm/px · z∈[-296,-204]mm · 3 of 92 slices shown, 4 images]
[im 23/92  soft-tissue]
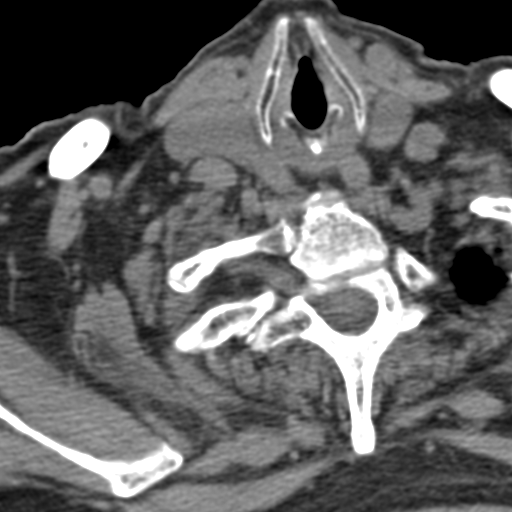
[im 23/92  bone]
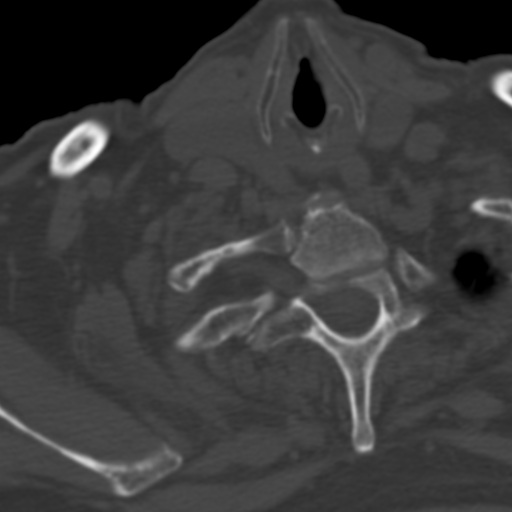
[im 46/92  bone]
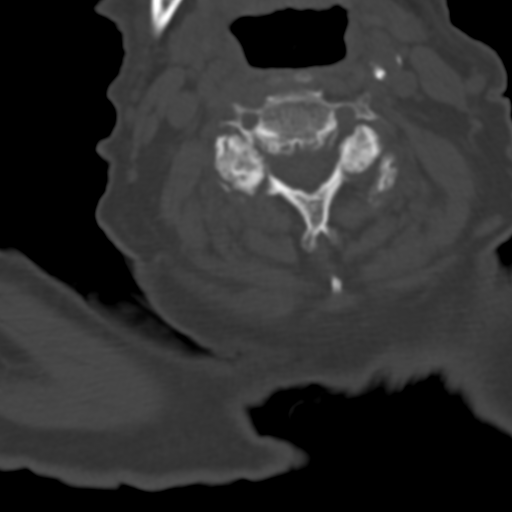
[im 69/92  bone]
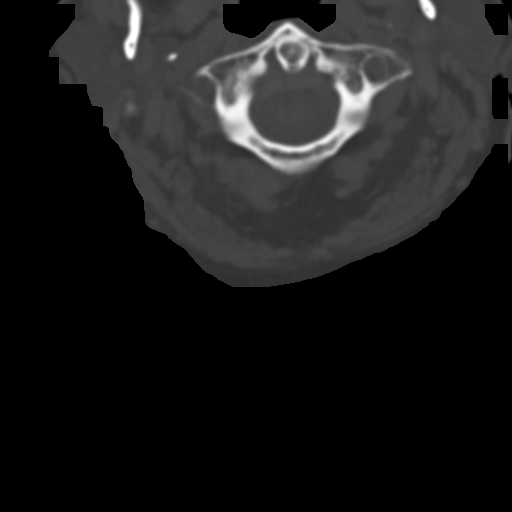

[Series 9: axial recon · axial · 0.18mm/px · z∈[-323,-241]mm · 3 of 101 slices shown]
[im 26/101  bone]
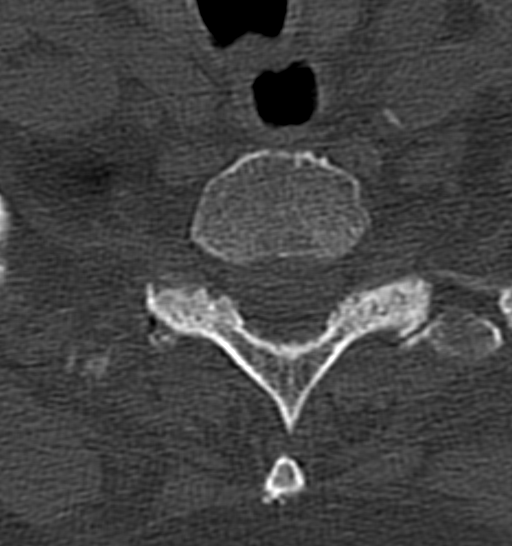
[im 51/101  bone]
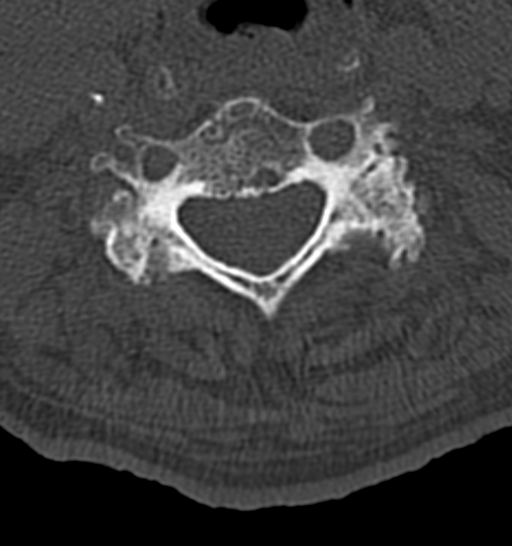
[im 76/101  bone]
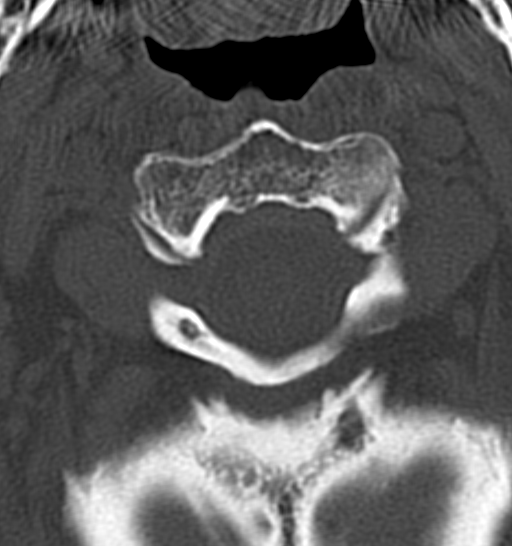

[12 of 33 positions shown; findings below may reference images not displayed]

FINDINGS: CT HEAD FINDINGS

BRAIN: The ventricles and sulci are normal for age. No
intraparenchymal hemorrhage, mass effect nor midline shift. Patchy
to confluent supratentorial white matter hypodensities. Old RIGHT
caudate head lacunar infarct. No acute large vascular territory
infarcts. 2 mm dense LEFT frontal subdural hematoma. Basal cisterns
are patent

VASCULAR: Moderate calcific atherosclerosis of the carotid siphons.

SKULL: No skull fracture. Small RIGHT frontal scalp hematoma without
subcutaneous gas or radiopaque foreign bodies.

SINUSES/ORBITS: The mastoid air-cells and included paranasal sinuses
are well-aerated.The included ocular globes and orbital contents are
non-suspicious.

OTHER: None.

CT CERVICAL SPINE FINDINGS

ALIGNMENT: Maintained cervical lordosis. Grade 1 C5-6
anterolisthesis. Grade 1 C7-T1 anterolisthesis.

SKULL BASE AND VERTEBRAE: Cervical vertebral bodies and posterior
elements are intact. Moderate to severe C6-7 disc height loss,
moderate at C7-T1 with endplate sclerosis and marginal spurring
compatible with degenerative discs, mild at C3-4 and C4-5. Severe
cervical facet arthropathy. C1-2 articulation maintained with severe
arthropathy. Osteopenia without destructive bony lesions.

SOFT TISSUES AND SPINAL CANAL: Soft tissues are nonacute. Moderate
calcific atherosclerosis of the carotid bulbs.

DISC LEVELS: No significant osseous canal stenosis. Severe LEFT
C3-4, severe RIGHT greater than LEFT C4-5, moderate C5-6 and
moderate C6-7 neural foraminal narrowing.

UPPER CHEST: Small RIGHT pleural effusion.

OTHER: None.
IMPRESSION: CT HEAD: 2 mm acute LEFT frontal subdural hematoma without mass
effect.

Involutional changes. Moderate to severe chronic small vessel
ischemic disease.

Small RIGHT frontal scalp hematoma.  No skull fracture.

CT CERVICAL SPINE: No acute fracture. C5-6 and C7-T1 anterolisthesis
on degenerative basis.

Small RIGHT pleural effusion.  Consider dedicated chest radiograph.

Acute findings discussed with and reconfirmed by Dr.TEODORA NOVOA
on 07/03/2016 at [DATE].

## 2017-09-18 IMAGING — CR DG PELVIS 1-2V
1 series · 1 of 1 positions shown · non-contrast
Comparison: CT abdomen and pelvis June 30, 2016

CLINICAL DATA: Unwitnessed fall at nursing facility today.

EXAM:
PELVIS - 1-2 VIEW

[x pelvis]
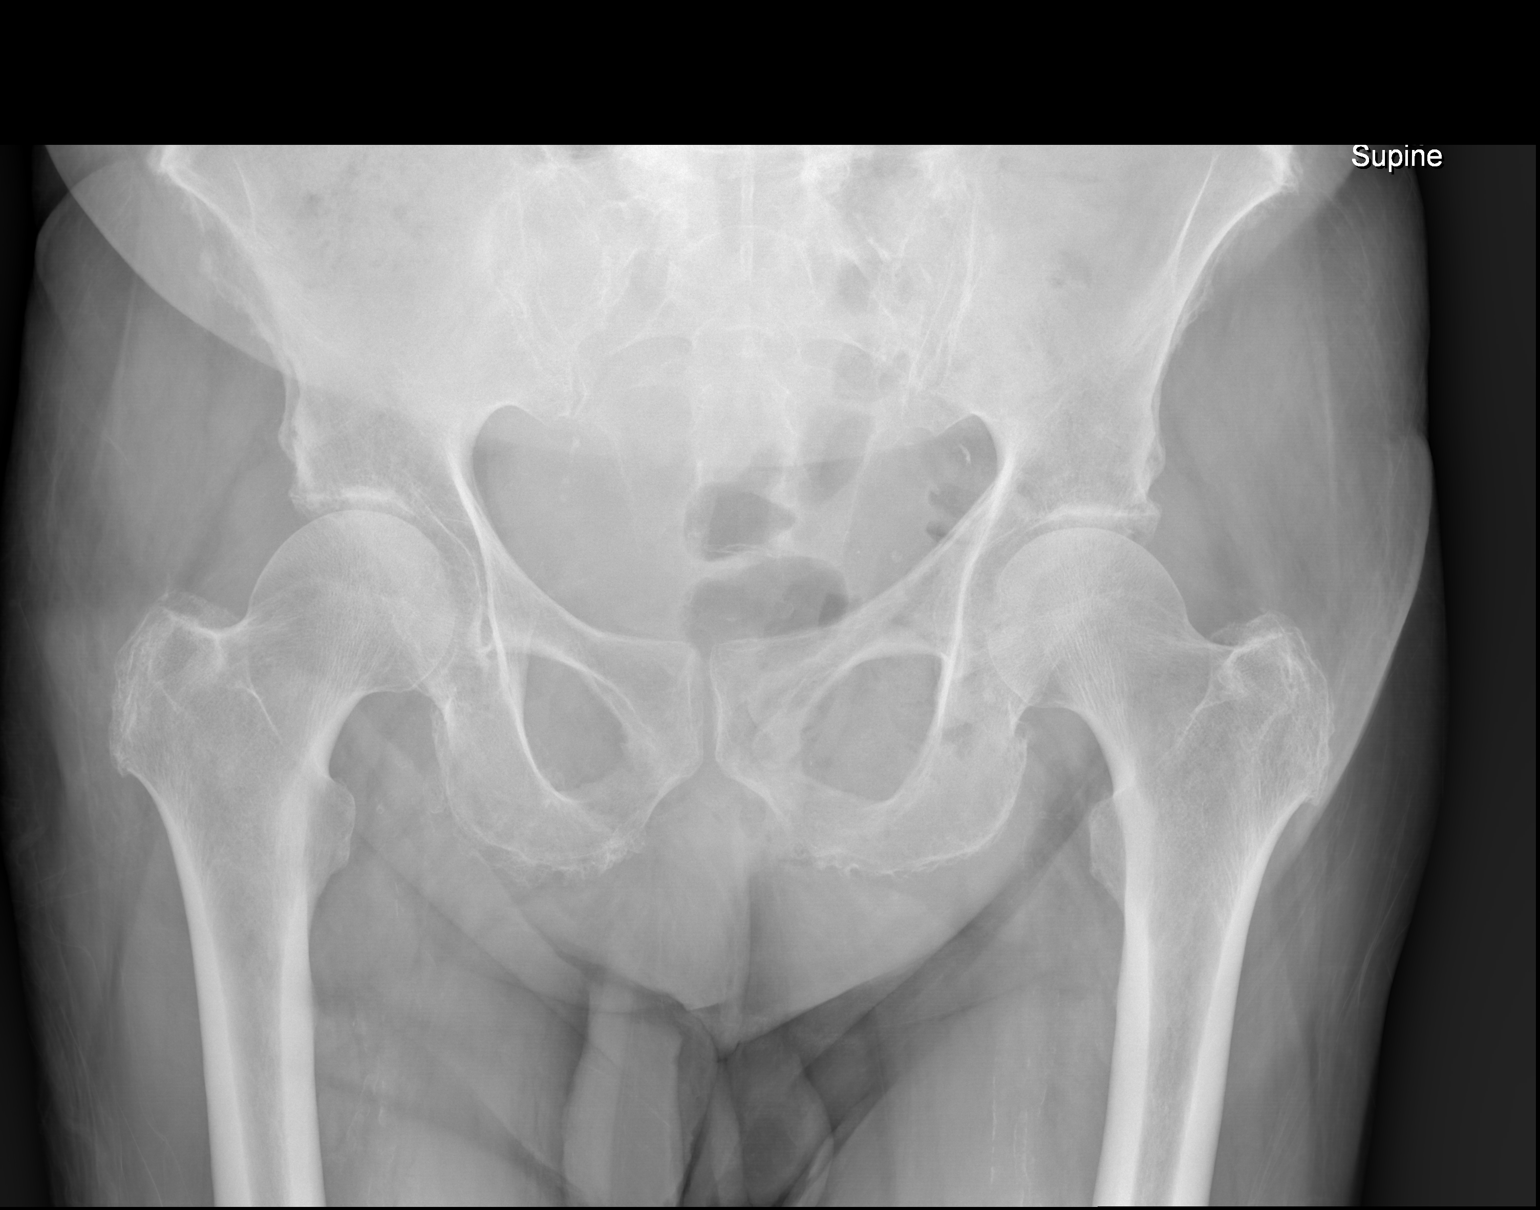

[1 of 1 positions shown; findings below may reference images not displayed]

FINDINGS: There is no evidence of pelvic fracture or diastasis. No pelvic bone
lesions are seen. Osteopenia. Sacroiliac ankylosis. Gas projecting
over LEFT hip corresponds to colonic gas within inguinal hernia as
seen on recent CT. Mild vascular calcifications.
IMPRESSION: Negative.

If there is high clinical suspicion for occult hip fracture or the
patient refuses to bear weight, consider further evaluation with
MRI. Although CT is expeditious, evidence is lacking regarding
accuracy of CT over plain film radiography.

## 2017-10-05 ENCOUNTER — Non-Acute Institutional Stay (SKILLED_NURSING_FACILITY): Payer: PPO | Admitting: Internal Medicine

## 2017-10-05 ENCOUNTER — Encounter: Payer: Self-pay | Admitting: Internal Medicine

## 2017-10-05 DIAGNOSIS — N400 Enlarged prostate without lower urinary tract symptoms: Secondary | ICD-10-CM

## 2017-10-05 DIAGNOSIS — I739 Peripheral vascular disease, unspecified: Secondary | ICD-10-CM | POA: Diagnosis not present

## 2017-10-05 DIAGNOSIS — K5901 Slow transit constipation: Secondary | ICD-10-CM

## 2017-10-05 DIAGNOSIS — R1314 Dysphagia, pharyngoesophageal phase: Secondary | ICD-10-CM

## 2017-10-05 NOTE — Progress Notes (Signed)
Location:  [facility] Room Number: 7 Place of Service:  SNF [phone]) Provider:  Blanchie Serve MD  Blanchie Serve, MD  Patient Care Team: Blanchie Serve, MD as PCP - General (Internal Medicine) Melina Modena, [facility] [name], MD as Consulting Physician (Ophthalmology) [name], MD as Consulting Physician (Gastroenterology) [name], MD as Consulting Physician (Urology) [name], MD as Consulting Physician (Dermatology) Ngetich, Nelda Bucks, NP as Nurse Practitioner [facility] Medicine)  Extended Emergency Contact Information Primary Emergency Contact: Cephus,Barbara Address: [address]          [place], [place] [address] [place] Phone: [phone] Relation: Spouse Secondary Emergency Contact: Winnifred Friar [place] Mobile Phone: [phone] Relation: Daughter  Code Status:  DNR  Goals of care: Advanced Directive information Advanced Directives 10/05/2017  Does Patient Have a Medical Advance Directive? Yes  Type of Advance Directive Living will;Out of facility DNR (pink MOST or yellow form)  Does patient want to make changes to medical advance directive? No - Patient declined  Copy of [place] in Chart? -  Pre-existing out of facility DNR order (yellow form or pink MOST form) Yellow form placed in chart (order not valid for inpatient use);Pink MOST form placed in chart (order not valid for inpatient use)     Chief Complaint  Patient presents with  . Medical Management of Chronic Issues    Routine Visit    HPI:  Pt is a 84 y.o. male seen today for medical management of chronic diseases. He is pleasantly confused and has been at his baseline from nursing. No acute health concern from patient and nursing. He is out of bed daily and is on his wheelchair. Limited HPI and ROS with his dysarthria and dementia.    Past Medical History:  Diagnosis Date  . Colon tumor 1985  .  Elevated prostate specific antigen (PSA) 05/13/2011  . Herpes zoster 05/13/2011  . Herpes zoster with ophthalmic complication 01/05/6758  . Hypertrophy of prostate without urinary obstruction and other lower urinary tract symptoms (LUTS) 09/20/2010  . Hypothyroid 01/29/2017  . Memory loss 03/25/2009  . 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 Nodular prostate without urinary obstruction 05/18/2012  . Osteoarthrosis, unspecified whether generalized or localized, unspecified site 09/20/2010  . Other and unspecified hyperlipidemia 09/20/2010  . Personal history of colonic polyps 09/20/2010  . Unspecified constipation 09/24/2010  . Unspecified glaucoma(365.9) 09/20/2010   bilateral   Past Surgical History:  Procedure Laterality Date  . APPENDECTOMY  1940  . CATARACT EXTRACTION W/ INTRAOCULAR LENS IMPLANT Right 2010   Dr. Ellie Lunch  . COLECTOMY  07/17/1997   sigmoid submucosal lipoma Dr. Oletta Lamas  . COLONOSCOPY  2000   normal  . COLONOSCOPY  04/21/2002   polypectomy  . COLONOSCOPY  06/10/2004   no polyps  . SKIN CANCER EXCISION Right 05/19/2013   ear Dr. Dannette Barbara  . TONSILLECTOMY  1938  . TUMOR REMOVAL  1993   Removal of Benign Intestinal Tumor   . VASECTOMY  1972    No Known Allergies  Outpatient Encounter Medications as of 10/05/2017  Medication Sig  . acetaminophen (TYLENOL) 650 MG CR tablet Take 1,300 mg by mouth at bedtime.  Marland Kitchen albuterol (PROVENTIL) (2.5 MG/3ML) 0.083% nebulizer solution Take 3 mLs (2.5 mg total) by nebulization every 4 (four) hours as needed for wheezing or shortness of breath.  . bisacodyl (DULCOLAX) 10 MG suppository Place 10 mg rectally every other day.  As needed  . chlorhexidine (PERIDEX) 0.12 % solution Use as directed in the mouth or throat at bedtime. 1 teaspoon.  . dorzolamide-timolol (COSOPT) 22.3-6.8 MG/ML ophthalmic solution Place 1 drop into both eyes 2 (two) times daily. 8PM and 8PM  . finasteride (PROSCAR) 5 MG tablet Take 5 mg by mouth daily.    . hydrocortisone cream 1 % Apply 1 application topically as needed for itching.  . latanoprost (XALATAN) 0.005 % ophthalmic solution Place 1 drop into both eyes at bedtime.   . Menthol, Topical Analgesic, (BIOFREEZE) 4 % GEL Apply 1 application topically 2 (two) times daily. Apply to the left shoulder  . Multiple Vitamins-Minerals (CENTRUM) tablet Take 1 tablet by mouth daily.  . polyethylene glycol (MIRALAX / GLYCOLAX) packet Take 17 g by mouth daily as needed for moderate constipation.  . senna-docusate (SENOKOT-S) 8.6-50 MG tablet Take 1 tablet by mouth 2 (two) times daily.   . sodium fluoride (FLUORIDEX DAILY DEFENSE) 1.1 % GEL dental gel Place 1 application onto teeth at bedtime.  . tamsulosin (FLOMAX) 0.4 MG CAPS capsule Take 0.4 mg by mouth daily.   Marland Kitchen zinc oxide 20 % ointment Apply 1 application topically 3 (three) times daily as needed. Apply to buttocks   No facility-administered encounter medications on file as of 10/05/2017.     Review of Systems  Unable to perform ROS: Dementia  Constitutional: Negative for appetite change and fever.  HENT: Positive for hearing loss and trouble swallowing. Negative for congestion and mouth sores.   Eyes: Positive for visual disturbance.       Has corrective glasses  Respiratory: Positive for cough. Negative for shortness of breath.   Cardiovascular: Negative for chest pain and palpitations.  Gastrointestinal: Negative for abdominal pain, nausea and vomiting.  Genitourinary: Negative for dysuria.  Musculoskeletal: Positive for arthralgias and gait problem.       Uses wheelchair for ambulation  Neurological: Negative for dizziness and headaches.  Psychiatric/Behavioral: Positive for confusion.    Immunization History  Administered Date(s) Administered  . Influenza Whole 06/29/2012, 06/30/2013  . Influenza-Unspecified 07/13/2014, 07/10/2015, 07/10/2016, 07/09/2017  . PPD Test 07/02/2016, 07/16/2016  . Pneumococcal Conjugate-13 05/20/2016    . Pneumococcal Polysaccharide-23 09/30/1995  . Td 09/29/1994  . Tdap 05/04/2017  . Zoster 09/30/2007  . Zoster Recombinat (Shingrix) 07/22/2017   Pertinent  Health Maintenance Due  Topic Date Due  . INFLUENZA VACCINE  Completed  . PNA vac Low Risk Adult  Completed   Fall Risk  05/01/2017 07/03/2016 05/20/2016 02/05/2016 11/20/2015  Falls in the past year? Yes Yes Yes Yes Yes  Number falls in past yr: 2 or more 2 or more 2 or more 2 or more 1  Comment - - 10 times in past 5 months - -  Injury with Fall? No Yes No Yes Yes  Comment - 07/03/16 laceration forehead, subdural hematome - - scape on elbow  Risk Factor Category  - - High Fall Risk High Fall Risk -  Risk for fall due to : - - - History of fall(s);Impaired mobility -   Functional Status Survey:    Vitals:   10/05/17 1010  BP: 134/72  Pulse: 69  Resp: 18  Temp: 97.8 F (36.6 C)  TempSrc: Oral  SpO2: 98%  Weight: 159 lb 6.4 oz (72.3 kg)  Height: 5\' 8"  (1.727 m)   Body mass index is 24.24 kg/m.   Wt Readings from Last 3 Encounters:  10/05/17 159 lb 6.4 oz (72.3 kg)  09/15/17 159  lb 6.4 oz (72.3 kg)  09/03/17 159 lb 6.4 oz (72.3 kg)   Physical Exam  Constitutional: He appears well-developed and well-nourished. No distress.  HENT:  Head: Normocephalic and atraumatic.  Mouth/Throat: Oropharynx is clear and moist.  Eyes: Conjunctivae and EOM are normal. Pupils are equal, round, and reactive to light. Right eye exhibits no discharge.  Ectropion present  Neck: Neck supple.  Cardiovascular: Normal rate and regular rhythm.  Pulmonary/Chest: Effort normal and breath sounds normal.  Abdominal: Soft. Bowel sounds are normal. There is no tenderness.  Musculoskeletal: He exhibits edema and deformity.  Trace leg edema, ted hose in place, unsteady gait, on wheelchair for ambulation  Lymphadenopathy:    He has no cervical adenopathy.  Neurological: He is alert.  Oriented to self and place only, dysarthria  Skin: Skin is warm  and dry. He is not diaphoretic.  Psychiatric:  Pleasantly confused, calm this visit    Labs reviewed: Recent Labs    01/26/17 08/03/17  NA 138 136*  K 4.8 4.1  BUN 24* 23*  CREATININE 1.3 1.0  CALCIUM  --  8.6   Recent Labs    01/26/17 08/03/17  AST 21 17  ALT 13 12  ALKPHOS 84 74  PROT  --  6.0  ALBUMIN  --  3.5   Recent Labs    01/26/17 05/31/17 06/01/17  WBC 8.6 10.5 10.5  HGB 13.8 13.4* 13.4*  HCT 42 39* 39*  PLT 192 193 193   Lab Results  Component Value Date   TSH 1.83 03/19/2017   No results found for: HGBA1C Lab Results  Component Value Date   CHOL 173 11/12/2015   HDL 44 11/12/2015   LDLCALC 109 11/12/2015   TRIG 102 11/12/2015    Significant Diagnostic Results in last 30 days:  No results found.  Assessment/Plan  Dysphagia High aspiration risk, aspiration precautions, honey thick liquid for now  Constipation On senokot s bid and bisacodyl qod with prn miralax, on chart review, has not required miralax. Discontinue this.   BPH Has UI, currently on finasteride and flomax, continue and monitor  PVD With leg edema, overall stable swelling and weight. Provide supportive care with ted hose and monitor   Family/ staff Communication: reviewed care plan with patient and charge nurse.    Labs/tests ordered:  none   Blanchie Serve, MD Internal Medicine [facility] [address] [place], [place] [address] Cell Phone (Monday-Friday 8 am - 5 pm): [phone] On Call: [phone] and follow prompts after 5 pm and on weekends Office Phone: [phone] Office Fax: [phone]

## 2017-10-14 ENCOUNTER — Encounter: Payer: Self-pay | Admitting: Internal Medicine

## 2017-10-14 NOTE — Progress Notes (Signed)
This encounter was created in error - please disregard.

## 2017-10-21 ENCOUNTER — Non-Acute Institutional Stay (SKILLED_NURSING_FACILITY): Payer: PPO | Admitting: Family

## 2017-10-21 ENCOUNTER — Encounter: Payer: Self-pay | Admitting: Family

## 2017-10-21 DIAGNOSIS — S51812A Laceration without foreign body of left forearm, initial encounter: Secondary | ICD-10-CM | POA: Diagnosis not present

## 2017-10-21 DIAGNOSIS — M19012 Primary osteoarthritis, left shoulder: Secondary | ICD-10-CM | POA: Diagnosis not present

## 2017-11-03 ENCOUNTER — Encounter: Payer: Self-pay | Admitting: Family

## 2017-11-03 ENCOUNTER — Non-Acute Institutional Stay (SKILLED_NURSING_FACILITY): Payer: PPO | Admitting: Family

## 2017-11-03 DIAGNOSIS — L02818 Cutaneous abscess of other sites: Secondary | ICD-10-CM

## 2017-11-03 DIAGNOSIS — N400 Enlarged prostate without lower urinary tract symptoms: Secondary | ICD-10-CM | POA: Diagnosis not present

## 2017-11-03 DIAGNOSIS — K5901 Slow transit constipation: Secondary | ICD-10-CM | POA: Diagnosis not present

## 2017-11-03 NOTE — Progress Notes (Signed)
Location:  [facility] Room Number: 7 Place of Service:  SNF (31) Provider: Dinah Ngetich FNP-C   Blanchie Serve, MD  Patient Care Team: Blanchie Serve, MD as PCP - General (Internal Medicine) Melina Modena, [facility] [name], MD as Consulting Physician (Ophthalmology) [name], MD as Consulting Physician (Gastroenterology) [name], MD as Consulting Physician (Urology) [name], MD as Consulting Physician (Dermatology) Ngetich, Nelda Bucks, NP as Nurse Practitioner (Family Medicine)  Extended Emergency Contact Information Primary Emergency Contact: Buzan,Barbara Address: [address]          [place], [place] [address] [place] Phone: [phone] Relation: Spouse Secondary Emergency Contact: Shattuck,Lynn  [place] Mobile Phone: [phone] Relation: Daughter  Code Status:  DNR Goals of care: Advanced Directive information Advanced Directives 11/03/2017  Does Patient Have a Medical Advance Directive? Yes  Type of Paramedic of [place];Out of facility DNR (pink MOST or yellow form);Living will  Does patient want to make changes to medical advance directive? -  Copy of [place] in Chart? Yes  Pre-existing out of facility DNR order (yellow form or pink MOST form) Yellow form placed in chart (order not valid for inpatient use);Pink MOST form placed in chart (order not valid for inpatient use)     Chief Complaint  Patient presents with  . Medical Management of Chronic Issues    routine visit    HPI:  Pt is a 84 y.o. male seen today [facility] for medical management of chronic diseases.He is seen in his room today with facility Nurse at bedside.Patient's Nurse states patient currently on oral antibiotics for recent mid back abscess which seems to be improving.Patient states mid back painful to touch.He denies any fever or chills. No recent  weight changes or fall episodes reported.He continues to self propel on wheelchair.    Past Medical History:  Diagnosis Date  . Colon tumor 1985  . Elevated prostate specific antigen (PSA) 05/13/2011  . Herpes zoster 05/13/2011  . Herpes zoster with ophthalmic complication 05/03/2777  . Hypertrophy of prostate without urinary obstruction and other lower urinary tract symptoms (LUTS) 09/20/2010  . Hypothyroid 01/29/2017  . Memory loss 03/25/2009  . 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 Nodular prostate without urinary obstruction 05/18/2012  . Osteoarthrosis, unspecified whether generalized or localized, unspecified site 09/20/2010  . Other and unspecified hyperlipidemia 09/20/2010  . Personal history of colonic polyps 09/20/2010  . Unspecified constipation 09/24/2010  . Unspecified glaucoma(365.9) 09/20/2010   bilateral   Past Surgical History:  Procedure Laterality Date  . APPENDECTOMY  1940  . CATARACT EXTRACTION W/ INTRAOCULAR LENS IMPLANT Right 2010   Dr. Ellie Lunch  . COLECTOMY  07/17/1997   sigmoid submucosal lipoma Dr. Oletta Lamas  . COLONOSCOPY  2000   normal  . COLONOSCOPY  04/21/2002   polypectomy  . COLONOSCOPY  06/10/2004   no polyps  . SKIN CANCER EXCISION Right 05/19/2013   ear Dr. Dannette Barbara  . TONSILLECTOMY  1938  . TUMOR REMOVAL  1993   Removal of Benign Intestinal Tumor   . VASECTOMY  1972    No Known Allergies  Allergies as of 11/03/2017   No Known Allergies     Medication List        Accurate as of 11/03/17  5:12 PM. Always use your most recent med list.          albuterol (2.5 MG/3ML) 0.083% nebulizer solution Commonly  known as:  PROVENTIL Take 3 mLs (2.5 mg total) by nebulization every 4 (four) hours as needed for wheezing or shortness of breath.   BIOFREEZE 4 % Gel Generic drug:  Menthol (Topical Analgesic) Apply 1 application topically 2 (two) times daily as needed. Apply to the left shoulder   bisacodyl 10 MG suppository Commonly  known as:  DULCOLAX Place 10 mg rectally every other day. As needed   CENTRUM tablet Take 1 tablet by mouth daily.   chlorhexidine 0.12 % solution Commonly known as:  PERIDEX Use as directed in the mouth or throat at bedtime. 1 teaspoon.   dorzolamide-timolol 22.3-6.8 MG/ML ophthalmic solution Commonly known as:  COSOPT Place 1 drop into both eyes 2 (two) times daily. 8PM and 8PM   finasteride 5 MG tablet Commonly known as:  PROSCAR Take 5 mg by mouth daily.   FLUORIDEX DAILY DEFENSE 1.1 % Gel dental gel Generic drug:  sodium fluoride Place 1 application onto teeth at bedtime.   hydrocortisone cream 1 % Apply 1 application topically as needed for itching.   latanoprost 0.005 % ophthalmic solution Commonly known as:  XALATAN Place 1 drop into both eyes at bedtime.   senna-docusate 8.6-50 MG tablet Commonly known as:  Senokot-S Take 1 tablet by mouth 2 (two) times daily.   tamsulosin 0.4 MG Caps capsule Commonly known as:  FLOMAX Take 0.4 mg by mouth daily.   zinc oxide 20 % ointment Apply 1 application topically 3 (three) times daily as needed. Apply to buttocks       Review of Systems  Constitutional: Negative for activity change, appetite change, chills, fatigue, fever and unexpected weight change.  HENT: Positive for hearing loss and trouble swallowing. Negative for congestion, rhinorrhea, sinus pressure, sinus pain, sneezing and sore throat.   Eyes: Positive for visual disturbance. Negative for pain, discharge and itching.       Wears eye glasses   Respiratory: Negative for cough, chest tightness, shortness of breath and wheezing.   Cardiovascular: Positive for leg swelling. Negative for chest pain and palpitations.  Gastrointestinal: Negative for abdominal distention, abdominal pain, constipation, diarrhea, nausea and vomiting.  Endocrine: Negative for cold intolerance, heat intolerance, polydipsia, polyphagia and polyuria.  Genitourinary: Negative for dysuria,  flank pain and urgency.  Musculoskeletal: Positive for arthralgias and gait problem.  Skin: Negative for color change and pallor.       Mid back abscess currently on oral doxycycline twice daily.   Neurological: Negative for dizziness, syncope, light-headedness and headaches.  Hematological: Does not bruise/bleed easily.  Psychiatric/Behavioral: Negative for agitation and sleep disturbance. The patient is not nervous/anxious.     Immunization History  Administered Date(s) Administered  . Influenza Whole 06/29/2012, 06/30/2013  . Influenza-Unspecified 07/13/2014, 07/10/2015, 07/10/2016, 07/09/2017  . PPD Test 07/02/2016, 07/16/2016  . Pneumococcal Conjugate-13 05/20/2016  . Pneumococcal Polysaccharide-23 09/30/1995  . Td 09/29/1994  . Tdap 05/04/2017  . Zoster 09/30/2007  . Zoster Recombinat (Shingrix) 07/22/2017   Pertinent  Health Maintenance Due  Topic Date Due  . INFLUENZA VACCINE  Completed  . PNA vac Low Risk Adult  Completed   Fall Risk  05/01/2017 07/03/2016 05/20/2016 02/05/2016 11/20/2015  Falls in the past year? Yes Yes Yes Yes Yes  Number falls in past yr: 2 or more 2 or more 2 or more 2 or more 1  Comment - - 10 times in past 5 months - -  Injury with Fall? No Yes No Yes Yes  Comment - 07/03/16 laceration forehead, subdural  hematome - - scape on elbow  Risk Factor Category  - - High Fall Risk High Fall Risk -  Risk for fall due to : - - - History of fall(s);Impaired mobility -    Vitals:   11/03/17 1036  BP: 128/72  Pulse: 76  Resp: 20  Temp: (!) 97.5 F (36.4 C)  SpO2: 96%  Weight: 165 lb 8 oz (75.1 kg)  Height: 5\' 8"  (1.727 m)   Body mass index is 25.16 kg/m. Physical Exam  Constitutional:  Thin tall elderly in no acute distress   HENT:  Head: Normocephalic.  Right Ear: External ear normal.  Left Ear: External ear normal.  Mouth/Throat: Oropharynx is clear and moist. No oropharyngeal exudate.  Eyes: Conjunctivae are normal. Pupils are equal, round, and  reactive to light. Right eye exhibits no discharge. Left eye exhibits no discharge. No scleral icterus.  Right eyelid ectropion.No drainage noted.   Neck: Normal range of motion. No JVD present. No thyromegaly present.  Cardiovascular: Normal rate, regular rhythm, normal heart sounds and intact distal pulses. Exam reveals no gallop and no friction rub.  No murmur heard. Pulmonary/Chest: Effort normal and breath sounds normal. No respiratory distress. He has no wheezes. He has no rales.  Abdominal: Soft. Bowel sounds are normal. He exhibits no distension. There is no tenderness. There is no rebound and no guarding.  Musculoskeletal: He exhibits no tenderness.  Unsteady gait transfers with assistance. Self propels on wheelchair.bilateral lower extremities trace edema. Knee high ted hose in place.   Lymphadenopathy:    He has no cervical adenopathy.  Neurological: Coordination normal.  Alert and oriented to person and place but not time   Skin: Skin is warm and dry. No rash noted.  Mid back abscess red with induration noted.Tender to touch.Small amounts of thick pus expressed.   Psychiatric: He has a normal mood and affect. His behavior is normal.   Labs reviewed: Recent Labs    01/26/17 08/03/17  NA 138 136*  K 4.8 4.1  BUN 24* 23*  CREATININE 1.3 1.0  CALCIUM  --  8.6   Recent Labs    01/26/17 08/03/17  AST 21 17  ALT 13 12  ALKPHOS 84 74  PROT  --  6.0  ALBUMIN  --  3.5   Recent Labs    01/26/17 05/31/17 06/01/17  WBC 8.6 10.5 10.5  HGB 13.8 13.4* 13.4*  HCT 42 39* 39*  PLT 192 193 193   Lab Results  Component Value Date   TSH 1.83 03/19/2017   No results found for: HGBA1C Lab Results  Component Value Date   CHOL 173 11/12/2015   HDL 44 11/12/2015   LDLCALC 109 11/12/2015   TRIG 102 11/12/2015    Significant Diagnostic Results in last 30 days:  No results found.  Assessment/Plan 1. Cutaneous abscess of other site Afebrile.Mid back abscess red with induration  noted.Tender to touch.Small amounts of thick pus expressed.Will continue on current Doxycycline 100 mg tablet twice daily as ordered by on call provider. Apply warm wash cloth compressor for 5-10 minutes twice daily and cover with non adhesive gauze.change dressing twice daily. Notify provider if not resolved or symptoms worsen.   2. Benign prostatic hyperplasia without lower urinary tract symptoms Stable.continue on finasteride 5 mg tablet and Flomax 0.4 mg capsule daily. Monitor for urinary retention.   3. Slow transit constipation Current regimen effective. Continue to encourage oral intake and hydration.   Family/ staff Communication: Reviewed plan of care  with patient and facility Nurse.   Labs/tests ordered: None   Dinah C Ngetich, NP

## 2017-11-08 NOTE — Progress Notes (Signed)
Location:  [facility] Room Number: 7 Place of Service:  SNF (31) Provider: Senta Kantor FNP-C  Blanchie Serve, MD  Patient Care Team: Blanchie Serve, MD as PCP - General (Internal Medicine) Melina Modena, [facility] [name], MD as Consulting Physician (Ophthalmology) [name], MD as Consulting Physician (Gastroenterology) [name], MD as Consulting Physician (Urology) [name], MD as Consulting Physician (Dermatology) Kerie Badger, Nelda Bucks, NP as Nurse Practitioner (Family Medicine)  Extended Emergency Contact Information Primary Emergency Contact: Gallina,Barbara Address: [address]          [place], [place] [address] [place] Phone: [phone] Relation: Spouse Secondary Emergency Contact: Shattuck,Lynn  [place] Mobile Phone: [phone] Relation: Daughter  Code Status:  DNR Goals of care: Advanced Directive information Advanced Directives 11/03/2017  Does Patient Have a Medical Advance Directive? Yes  Type of Paramedic of [place];Out of facility DNR (pink MOST or yellow form);Living will  Does patient want to make changes to medical advance directive? -  Copy of [place] in Chart? Yes  Pre-existing out of facility DNR order (yellow form or pink MOST form) Yellow form placed in chart (order not valid for inpatient use);Pink MOST form placed in chart (order not valid for inpatient use)     Chief Complaint  Patient presents with  . Acute Visit    Arthritis    HPI:  Pt is a 84 y.o. male seen today at [facility] for an acute visit for evaluation of pain.He is seen in his room today.He denies any acute issues during visit.Facility Nurse reports patient's POA would like patient's tylenol for arthritis to be discontinued.Patient states left shoulder pain under control.    Past Medical History:  Diagnosis Date  . Colon tumor 1985  .  Elevated prostate specific antigen (PSA) 05/13/2011  . Herpes zoster 05/13/2011  . Herpes zoster with ophthalmic complication 06/06/3381  . Hypertrophy of prostate without urinary obstruction and other lower urinary tract symptoms (LUTS) 09/20/2010  . Hypothyroid 01/29/2017  . Memory loss 03/25/2009  . 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 Nodular prostate without urinary obstruction 05/18/2012  . Osteoarthrosis, unspecified whether generalized or localized, unspecified site 09/20/2010  . Other and unspecified hyperlipidemia 09/20/2010  . Personal history of colonic polyps 09/20/2010  . Unspecified constipation 09/24/2010  . Unspecified glaucoma(365.9) 09/20/2010   bilateral   Past Surgical History:  Procedure Laterality Date  . APPENDECTOMY  1940  . CATARACT EXTRACTION W/ INTRAOCULAR LENS IMPLANT Right 2010   Dr. Ellie Lunch  . COLECTOMY  07/17/1997   sigmoid submucosal lipoma Dr. Oletta Lamas  . COLONOSCOPY  2000   normal  . COLONOSCOPY  04/21/2002   polypectomy  . COLONOSCOPY  06/10/2004   no polyps  . SKIN CANCER EXCISION Right 05/19/2013   ear Dr. Dannette Barbara  . TONSILLECTOMY  1938  . TUMOR REMOVAL  1993   Removal of Benign Intestinal Tumor   . VASECTOMY  1972    No Known Allergies  Outpatient Encounter Medications as of 10/21/2017  Medication Sig  . albuterol (PROVENTIL) (2.5 MG/3ML) 0.083% nebulizer solution Take 3 mLs (2.5 mg total) by nebulization every 4 (four) hours as needed for wheezing or shortness of breath.  . bisacodyl (DULCOLAX) 10 MG suppository Place 10 mg rectally every other day. As needed  . chlorhexidine (PERIDEX) 0.12 % solution Use as directed in the mouth or throat at bedtime. 1 teaspoon.  . dorzolamide-timolol (  COSOPT) 22.3-6.8 MG/ML ophthalmic solution Place 1 drop into both eyes 2 (two) times daily. 8PM and 8PM  . finasteride (PROSCAR) 5 MG tablet Take 5 mg by mouth daily.   . hydrocortisone cream 1 % Apply 1 application topically as needed for  itching.  . latanoprost (XALATAN) 0.005 % ophthalmic solution Place 1 drop into both eyes at bedtime.   . Menthol, Topical Analgesic, (BIOFREEZE) 4 % GEL Apply 1 application topically 2 (two) times daily as needed. Apply to the left shoulder   . Multiple Vitamins-Minerals (CENTRUM) tablet Take 1 tablet by mouth daily.  Marland Kitchen senna-docusate (SENOKOT-S) 8.6-50 MG tablet Take 1 tablet by mouth 2 (two) times daily.   . sodium fluoride (FLUORIDEX DAILY DEFENSE) 1.1 % GEL dental gel Place 1 application onto teeth at bedtime.  . tamsulosin (FLOMAX) 0.4 MG CAPS capsule Take 0.4 mg by mouth daily.   Marland Kitchen zinc oxide 20 % ointment Apply 1 application topically 3 (three) times daily as needed. Apply to buttocks  . [DISCONTINUED] acetaminophen (TYLENOL) 650 MG CR tablet Take 1,300 mg by mouth at bedtime.  . [DISCONTINUED] polyethylene glycol (MIRALAX / GLYCOLAX) packet Take 17 g by mouth daily as needed.   No facility-administered encounter medications on file as of 10/21/2017.     Review of Systems  Constitutional: Negative for chills, fatigue and fever.  Respiratory: Negative for cough, chest tightness, shortness of breath and wheezing.   Cardiovascular: Negative for chest pain, palpitations and leg swelling.  Gastrointestinal: Negative for abdominal distention, abdominal pain, constipation, diarrhea, nausea and vomiting.  Musculoskeletal: Positive for arthralgias and gait problem.       Left shoulder pain under control  Skin: Negative for color change, pallor and rash.  Psychiatric/Behavioral: Negative for agitation and confusion. The patient is not nervous/anxious.     Immunization History  Administered Date(s) Administered  . Influenza Whole 06/29/2012, 06/30/2013  . Influenza-Unspecified 07/13/2014, 07/10/2015, 07/10/2016, 07/09/2017  . PPD Test 07/02/2016, 07/16/2016  . Pneumococcal Conjugate-13 05/20/2016  . Pneumococcal Polysaccharide-23 09/30/1995  . Td 09/29/1994  . Tdap 05/04/2017  . Zoster  09/30/2007  . Zoster Recombinat (Shingrix) 07/22/2017   Pertinent  Health Maintenance Due  Topic Date Due  . INFLUENZA VACCINE  Completed  . PNA vac Low Risk Adult  Completed   Fall Risk  05/01/2017 07/03/2016 05/20/2016 02/05/2016 11/20/2015  Falls in the past year? Yes Yes Yes Yes Yes  Number falls in past yr: 2 or more 2 or more 2 or more 2 or more 1  Comment - - 10 times in past 5 months - -  Injury with Fall? No Yes No Yes Yes  Comment - 07/03/16 laceration forehead, subdural hematome - - scape on elbow  Risk Factor Category  - - High Fall Risk High Fall Risk -  Risk for fall due to : - - - History of fall(s);Impaired mobility -    Vitals:   10/21/17 1205  BP: 127/70  Pulse: 65  Resp: 18  Temp: (!) 97.1 F (36.2 C)  SpO2: 95%  Weight: 159 lb 6.4 oz (72.3 kg)  Height: 5\' 8"  (1.727 m)   Body mass index is 24.24 kg/m. Physical Exam  Constitutional: He appears well-developed. No distress.  Elderly in no acute distress   HENT:  Head: Normocephalic.  Mouth/Throat: Oropharynx is clear and moist. No oropharyngeal exudate.  Eyes: Conjunctivae and EOM are normal. Pupils are equal, round, and reactive to light. Right eye exhibits no discharge. Left eye exhibits no discharge. No  scleral icterus.  Neck: Normal range of motion. No JVD present. No thyromegaly present.  Cardiovascular: Normal rate, regular rhythm, normal heart sounds and intact distal pulses. Exam reveals no gallop and no friction rub.  No murmur heard. Pulmonary/Chest: Effort normal and breath sounds normal. No respiratory distress. He has no wheezes. He has no rales.  Abdominal: Soft. Bowel sounds are normal. He exhibits no distension. There is no tenderness. There is no rebound and no guarding.  Musculoskeletal: He exhibits no tenderness.  Moves x 4 extremities limited ROM left shoulder due to pain.unsteady gait uses wheelchair.trace edema to lower extremities.   Lymphadenopathy:    He has no cervical adenopathy.    Neurological: Coordination normal.  Alert and oriented to person,place except time.   Skin: Skin is warm and dry. No rash noted. No erythema.  Left forearm skin tear without any signs of infection.  Psychiatric: He has a normal mood and affect.    Labs reviewed: Recent Labs    01/26/17 08/03/17  NA 138 136*  K 4.8 4.1  BUN 24* 23*  CREATININE 1.3 1.0  CALCIUM  --  8.6   Recent Labs    01/26/17 08/03/17  AST 21 17  ALT 13 12  ALKPHOS 84 74  PROT  --  6.0  ALBUMIN  --  3.5   Recent Labs    01/26/17 05/31/17 06/01/17  WBC 8.6 10.5 10.5  HGB 13.8 13.4* 13.4*  HCT 42 39* 39*  PLT 192 193 193   Lab Results  Component Value Date   TSH 1.83 03/19/2017    Lab Results  Component Value Date   CHOL 173 11/12/2015   HDL 44 11/12/2015   LDLCALC 109 11/12/2015   TRIG 102 11/12/2015    Significant Diagnostic Results in last 30 days:  No results found.  Assessment/Plan 1. Primary osteoarthritis of left shoulder Limited ROM to left shoulder pain under control.Discontinue Tylenol for arthritis per POA request.continue to monitor.   2. Skin tear of left forearm without complication, initial encounter Left forearm skin tear without ay signs of infections.continue to cleanse skin tear with saline,pat dry and cover with non adhesive gauze.change dressing every three days.continue to monitor for signs and symptoms of infections.   Family/ staff Communication: Reviewed plan of care with patient and facility Nurse.  Labs/tests ordered: None   Lameisha Schuenemann C Tiernan Suto, NP

## 2017-11-11 ENCOUNTER — Non-Acute Institutional Stay (SKILLED_NURSING_FACILITY): Payer: PPO | Admitting: Family

## 2017-11-11 ENCOUNTER — Encounter: Payer: Self-pay | Admitting: Family

## 2017-11-11 DIAGNOSIS — L02818 Cutaneous abscess of other sites: Secondary | ICD-10-CM | POA: Diagnosis not present

## 2017-11-11 NOTE — Progress Notes (Signed)
Location:  [facility] Room Number: 7 Place of Service:  SNF (31) Provider: Rain Friedt FNP-C  Blanchie Serve, MD  Patient Care Team: Blanchie Serve, MD as PCP - General (Internal Medicine) Melina Modena, [facility] [name], MD as Consulting Physician (Ophthalmology) [name], MD as Consulting Physician (Gastroenterology) [name], MD as Consulting Physician (Urology) [name], MD as Consulting Physician (Dermatology) Kendell Sagraves, Nelda Bucks, NP as Nurse Practitioner (Family Medicine)  Extended Emergency Contact Information Primary Emergency Contact: Syfert,Barbara Address: [address]          [place], [place] [address] [place] Phone: [phone] Relation: Spouse Secondary Emergency Contact: Shattuck,Lynn  [place] Mobile Phone: [phone] Relation: Daughter  Code Status:  DNR Goals of care: Advanced Directive information Advanced Directives 11/11/2017  Does Patient Have a Medical Advance Directive? Yes  Type of Paramedic of [place];Living will;Out of facility DNR (pink MOST or yellow form)  Does patient want to make changes to medical advance directive? No - Patient declined  Copy of [place] in Chart? Yes  Pre-existing out of facility DNR order (yellow form or pink MOST form) Yellow form placed in chart (order not valid for inpatient use);Pink MOST form placed in chart (order not valid for inpatient use)     Chief Complaint  Patient presents with  . Acute Visit    Follow up for boil on mid back    HPI:  Pt is a 84 y.o. male seen today at [facility] for an acute visit for follow up recent mid back abscess.He is seen in his room today with facility Nurse present at bedside.He states mid back pain has resolved.He denies any fever or chills.Nurse states continues to cover abscess site with nona dhesive gauze.No more drainage noted.     Past Medical History:  Diagnosis Date  . Colon tumor 1985  . Elevated prostate specific antigen (PSA) 05/13/2011  . Herpes zoster 05/13/2011  . Herpes zoster with ophthalmic complication 11/03/5807  . Hypertrophy of prostate without urinary obstruction and other lower urinary tract symptoms (LUTS) 09/20/2010  . Hypothyroid 01/29/2017  . Memory loss 03/25/2009  . 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 Nodular prostate without urinary obstruction 05/18/2012  . Osteoarthrosis, unspecified whether generalized or localized, unspecified site 09/20/2010  . Other and unspecified hyperlipidemia 09/20/2010  . Personal history of colonic polyps 09/20/2010  . Unspecified constipation 09/24/2010  . Unspecified glaucoma(365.9) 09/20/2010   bilateral   Past Surgical History:  Procedure Laterality Date  . APPENDECTOMY  1940  . CATARACT EXTRACTION W/ INTRAOCULAR LENS IMPLANT Right 2010   Dr. Ellie Lunch  . COLECTOMY  07/17/1997   sigmoid submucosal lipoma Dr. Oletta Lamas  . COLONOSCOPY  2000   normal  . COLONOSCOPY  04/21/2002   polypectomy  . COLONOSCOPY  06/10/2004   no polyps  . SKIN CANCER EXCISION Right 05/19/2013   ear Dr. Dannette Barbara  . TONSILLECTOMY  1938  . TUMOR REMOVAL  1993   Removal of Benign Intestinal Tumor   . VASECTOMY  1972    No Known Allergies  Outpatient Encounter Medications as of 11/11/2017  Medication Sig  . albuterol (PROVENTIL) (2.5 MG/3ML) 0.083% nebulizer solution Take 3 mLs (2.5 mg total) by nebulization every 4 (four) hours as needed for wheezing or shortness of breath.  . bisacodyl (DULCOLAX) 10 MG suppository Place 10 mg rectally every other day. As needed  . chlorhexidine (PERIDEX) 0.12 %  solution Use as directed in the mouth or throat at bedtime. 1 teaspoon.  . dorzolamide-timolol (COSOPT) 22.3-6.8 MG/ML ophthalmic solution Place 1 drop into both eyes 2 (two) times daily. 8PM and 8PM  . finasteride (PROSCAR) 5 MG tablet Take 5 mg by mouth daily.   .  hydrocortisone cream 1 % Apply 1 application topically as needed for itching.  . latanoprost (XALATAN) 0.005 % ophthalmic solution Place 1 drop into both eyes at bedtime.   . Menthol, Topical Analgesic, (BIOFREEZE) 4 % GEL Apply 1 application topically 2 (two) times daily as needed. Apply to the left shoulder   . Multiple Vitamins-Minerals (CENTRUM) tablet Take 1 tablet by mouth daily.  Marland Kitchen saccharomyces boulardii (FLORASTOR) 250 MG capsule Take 250 mg by mouth 2 (two) times daily.  Marland Kitchen senna-docusate (SENOKOT-S) 8.6-50 MG tablet Take 1 tablet by mouth 2 (two) times daily.   . sodium fluoride (FLUORIDEX DAILY DEFENSE) 1.1 % GEL dental gel Place 1 application onto teeth at bedtime.  . tamsulosin (FLOMAX) 0.4 MG CAPS capsule Take 0.4 mg by mouth daily.   Marland Kitchen zinc oxide 20 % ointment Apply 1 application topically 3 (three) times daily as needed. Apply to buttocks   No facility-administered encounter medications on file as of 11/11/2017.     Review of Systems  Constitutional: Negative for chills, fatigue and fever.  Respiratory: Negative for cough, chest tightness, shortness of breath and wheezing.   Cardiovascular: Positive for leg swelling. Negative for chest pain and palpitations.  Gastrointestinal: Negative for abdominal distention, abdominal pain, constipation, diarrhea, nausea and vomiting.  Musculoskeletal: Positive for gait problem.  Skin: Negative for color change, pallor and rash.       Mid back abscess has improved.   Psychiatric/Behavioral: Negative for agitation, confusion and sleep disturbance.    Immunization History  Administered Date(s) Administered  . Influenza Whole 06/29/2012, 06/30/2013  . Influenza-Unspecified 07/13/2014, 07/10/2015, 07/10/2016, 07/09/2017  . PPD Test 07/02/2016, 07/16/2016  . Pneumococcal Conjugate-13 05/20/2016  . Pneumococcal Polysaccharide-23 09/30/1995  . Td 09/29/1994  . Tdap 05/04/2017  . Zoster 09/30/2007  . Zoster Recombinat (Shingrix) 07/22/2017    Pertinent  Health Maintenance Due  Topic Date Due  . INFLUENZA VACCINE  Completed  . PNA vac Low Risk Adult  Completed   Fall Risk  05/01/2017 07/03/2016 05/20/2016 02/05/2016 11/20/2015  Falls in the past year? Yes Yes Yes Yes Yes  Number falls in past yr: 2 or more 2 or more 2 or more 2 or more 1  Comment - - 10 times in past 5 months - -  Injury with Fall? No Yes No Yes Yes  Comment - 07/03/16 laceration forehead, subdural hematome - - scape on elbow  Risk Factor Category  - - High Fall Risk High Fall Risk -  Risk for fall due to : - - - History of fall(s);Impaired mobility -    Vitals:   11/11/17 1212  BP: 127/70  Pulse: 74  Resp: 20  Temp: 97.9 F (36.6 C)  TempSrc: Oral  SpO2: 98%  Weight: 165 lb 8 oz (75.1 kg)  Height: 5\' 5"  (1.651 m)   Body mass index is 27.54 kg/m. Physical Exam  Constitutional: He appears well-developed.  Elderly in no acute distress   HENT:  Head: Normocephalic.  Mouth/Throat: No oropharyngeal exudate.  Eyes: Conjunctivae and EOM are normal. Pupils are equal, round, and reactive to light. Right eye exhibits no discharge. Left eye exhibits no discharge. No scleral icterus.  Neck: Normal range of motion.  No JVD present. No thyromegaly present.  Cardiovascular: Normal rate, regular rhythm, normal heart sounds and intact distal pulses. Exam reveals no gallop and no friction rub.  No murmur heard. Pulmonary/Chest: Effort normal and breath sounds normal. No respiratory distress. He has no wheezes. He has no rales.  Abdominal: Soft. Bowel sounds are normal. He exhibits no distension. There is no tenderness. There is no rebound and no guarding.  Musculoskeletal: He exhibits no tenderness.  Moves x 4 extremities.uses wheelchair to ambulate.bilateral lower extremities knee high ted hose in place.Trace edema bilateral.   Lymphadenopathy:    He has no cervical adenopathy.  Neurological: Coordination normal.  Alert and oriented to person and place  Skin: Skin  is warm and dry. No rash noted. No erythema.  Previous mid-back abscess site redness and tenderness now resolved.small induration noted.No drainage.   Psychiatric: He has a normal mood and affect.   Labs reviewed: Recent Labs    01/26/17 08/03/17  NA 138 136*  K 4.8 4.1  BUN 24* 23*  CREATININE 1.3 1.0  CALCIUM  --  8.6   Recent Labs    01/26/17 08/03/17  AST 21 17  ALT 13 12  ALKPHOS 84 74  PROT  --  6.0  ALBUMIN  --  3.5   Recent Labs    01/26/17 05/31/17 06/01/17  WBC 8.6 10.5 10.5  HGB 13.8 13.4* 13.4*  HCT 42 39* 39*  PLT 192 193 193   Lab Results  Component Value Date   TSH 1.83 03/19/2017    Lab Results  Component Value Date   CHOL 173 11/12/2015   HDL 44 11/12/2015   LDLCALC 109 11/12/2015   TRIG 102 11/12/2015    Significant Diagnostic Results in last 30 days:  No results found.  Assessment/Plan  Cutaneous abscess of other site Afebrile.Status post oral antibiotics completion.Previous mid-back abscess site redness and tenderness now resolved.small induration noted.No drainage.will continue to monitor for signs and symptoms of infections.continue with Non-adhesive gauze dressing change daily.Dressing goal for protection to abscess site.Continue to monitor.  Family/ staff Communication: Reviewed plan of care with patient and facility Nurse.   Labs/tests ordered: None   Keano Guggenheim C Seddrick Flax, NP

## 2017-11-16 ENCOUNTER — Encounter: Payer: Self-pay | Admitting: Family

## 2017-11-16 NOTE — Progress Notes (Signed)
Opened in error. Disregard all info.

## 2017-11-30 ENCOUNTER — Non-Acute Institutional Stay (SKILLED_NURSING_FACILITY): Payer: PPO | Admitting: Internal Medicine

## 2017-11-30 ENCOUNTER — Encounter: Payer: Self-pay | Admitting: Internal Medicine

## 2017-11-30 DIAGNOSIS — R1314 Dysphagia, pharyngoesophageal phase: Secondary | ICD-10-CM | POA: Diagnosis not present

## 2017-11-30 DIAGNOSIS — D638 Anemia in other chronic diseases classified elsewhere: Secondary | ICD-10-CM

## 2017-11-30 DIAGNOSIS — R490 Dysphonia: Secondary | ICD-10-CM

## 2017-11-30 DIAGNOSIS — M19012 Primary osteoarthritis, left shoulder: Secondary | ICD-10-CM | POA: Diagnosis not present

## 2017-11-30 DIAGNOSIS — H409 Unspecified glaucoma: Secondary | ICD-10-CM | POA: Diagnosis not present

## 2017-11-30 DIAGNOSIS — N401 Enlarged prostate with lower urinary tract symptoms: Secondary | ICD-10-CM | POA: Diagnosis not present

## 2017-11-30 NOTE — Progress Notes (Signed)
Location:  [facility] Room Number: 7 Place of Service:  SNF [phone]) Provider:  Blanchie Serve MD  Blanchie Serve, MD  Patient Care Team: Blanchie Serve, MD as PCP - General (Internal Medicine) Melina Modena, [facility] [name], MD as Consulting Physician (Ophthalmology) [name], MD as Consulting Physician (Gastroenterology) [name], MD as Consulting Physician (Urology) [name], MD as Consulting Physician (Dermatology) Ngetich, Nelda Bucks, NP as Nurse Practitioner [facility] Medicine)  Extended Emergency Contact Information Primary Emergency Contact: Boehner,Barbara Address: [address]          [place], [place] [address] [place] Phone: [phone] Relation: Spouse Secondary Emergency Contact: Winnifred Friar [place] Mobile Phone: [phone] Relation: Daughter  Code Status:  DNR  Goals of care: Advanced Directive information Advanced Directives 11/30/2017  Does Patient Have a Medical Advance Directive? Yes  Type of Paramedic of [place];Living will;Out of facility DNR (pink MOST or yellow form)  Does patient want to make changes to medical advance directive? No - Patient declined  Copy of [place] in Chart? Yes  Pre-existing out of facility DNR order (yellow form or pink MOST form) Yellow form placed in chart (order not valid for inpatient use);Pink MOST form placed in chart (order not valid for inpatient use)     Chief Complaint  Patient presents with  . Medical Management of Chronic Issues    Routine Visit     HPI:  Pt is a 84 y.o. male seen today for medical management of chronic diseases. He has been using biofreeze to his shoulders with some help. He is on dysphagia diet and feeds himself under supervision. He needs assistance with his ADLs. He is taking his medications. No falls reported in February. Has corrective glasses. He is  incontinent with bowel and bladder. No pressure ulcer reported. HPI and ROS limited with his dementia and dysphonia.    Past Medical History:  Diagnosis Date  . Colon tumor 1985  . Elevated prostate specific antigen (PSA) 05/13/2011  . Herpes zoster 05/13/2011  . Herpes zoster with ophthalmic complication 11/07/9240  . Hypertrophy of prostate without urinary obstruction and other lower urinary tract symptoms (LUTS) 09/20/2010  . Hypothyroid 01/29/2017  . Memory loss 03/25/2009  . 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 Nodular prostate without urinary obstruction 05/18/2012  . Osteoarthrosis, unspecified whether generalized or localized, unspecified site 09/20/2010  . Other and unspecified hyperlipidemia 09/20/2010  . Personal history of colonic polyps 09/20/2010  . Unspecified constipation 09/24/2010  . Unspecified glaucoma(365.9) 09/20/2010   bilateral   Past Surgical History:  Procedure Laterality Date  . APPENDECTOMY  1940  . CATARACT EXTRACTION W/ INTRAOCULAR LENS IMPLANT Right 2010   Dr. Ellie Lunch  . COLECTOMY  07/17/1997   sigmoid submucosal lipoma Dr. Oletta Lamas  . COLONOSCOPY  2000   normal  . COLONOSCOPY  04/21/2002   polypectomy  . COLONOSCOPY  06/10/2004   no polyps  . SKIN CANCER EXCISION Right 05/19/2013   ear Dr. Dannette Barbara  . TONSILLECTOMY  1938  . TUMOR REMOVAL  1993   Removal of Benign Intestinal Tumor   . VASECTOMY  1972    No Known Allergies  Outpatient Encounter Medications as of 11/30/2017  Medication Sig  . acetaminophen (TYLENOL) 500 MG tablet Take 500 mg by mouth 3 (three) times daily.  Marland Kitchen albuterol (PROVENTIL) (2.5 MG/3ML) 0.083% nebulizer solution Take 3 mLs (2.5 mg total) by nebulization  every 4 (four) hours as needed for wheezing or shortness of breath.  . bisacodyl (DULCOLAX) 10 MG suppository Place 10 mg rectally every other day. As needed  . chlorhexidine (PERIDEX) 0.12 % solution Use as directed in the mouth or throat at bedtime. 1 teaspoon.   . dorzolamide-timolol (COSOPT) 22.3-6.8 MG/ML ophthalmic solution Place 1 drop into both eyes 2 (two) times daily. 8PM and 8PM  . finasteride (PROSCAR) 5 MG tablet Take 5 mg by mouth daily.   . hydrocortisone cream 1 % Apply 1 application topically as needed for itching.  . latanoprost (XALATAN) 0.005 % ophthalmic solution Place 1 drop into both eyes at bedtime.   . Menthol, Topical Analgesic, (BIOFREEZE) 4 % GEL Apply 1 application topically 3 (three) times daily. Apply to the left shoulder   . Menthol, Topical Analgesic, (BIOFREEZE) 4 % GEL Apply 1 application topically 2 (two) times daily as needed.  . Multiple Vitamins-Minerals (CENTRUM) tablet Take 1 tablet by mouth daily.  Marland Kitchen senna-docusate (SENOKOT-S) 8.6-50 MG tablet Take 1 tablet by mouth 2 (two) times daily.   . sodium fluoride (FLUORIDEX DAILY DEFENSE) 1.1 % GEL dental gel Place 1 application onto teeth at bedtime.  . tamsulosin (FLOMAX) 0.4 MG CAPS capsule Take 0.4 mg by mouth daily.   Marland Kitchen zinc oxide 20 % ointment Apply 1 application topically 3 (three) times daily as needed. Apply to buttocks   No facility-administered encounter medications on file as of 11/30/2017.     Review of Systems  Constitutional: Negative for appetite change, diaphoresis and fever.  HENT: Positive for hearing loss, trouble swallowing and voice change. Negative for congestion, mouth sores, rhinorrhea and sore throat.   Eyes: Positive for visual disturbance.  Respiratory: Negative for cough and shortness of breath.   Cardiovascular: Negative for chest pain.  Gastrointestinal: Negative for abdominal pain, constipation, diarrhea, nausea and vomiting.  Genitourinary: Negative for dysuria.       Has urinary incontinence  Musculoskeletal: Positive for arthralgias and gait problem.  Skin: Negative for rash.  Neurological: Negative for dizziness and headaches.  Hematological: Bruises/bleeds easily.  Psychiatric/Behavioral: Positive for confusion.     Immunization History  Administered Date(s) Administered  . Influenza Whole 06/29/2012, 06/30/2013  . Influenza-Unspecified 07/13/2014, 07/10/2015, 07/10/2016, 07/09/2017  . PPD Test 07/02/2016, 07/16/2016  . Pneumococcal Conjugate-13 05/20/2016  . Pneumococcal Polysaccharide-23 09/30/1995  . Td 09/29/1994  . Tdap 05/04/2017  . Zoster 09/30/2007  . Zoster Recombinat (Shingrix) 07/22/2017   Pertinent  Health Maintenance Due  Topic Date Due  . INFLUENZA VACCINE  Completed  . PNA vac Low Risk Adult  Completed   Fall Risk  05/01/2017 07/03/2016 05/20/2016 02/05/2016 11/20/2015  Falls in the past year? Yes Yes Yes Yes Yes  Number falls in past yr: 2 or more 2 or more 2 or more 2 or more 1  Comment - - 10 times in past 5 months - -  Injury with Fall? No Yes No Yes Yes  Comment - 07/03/16 laceration forehead, subdural hematome - - scape on elbow  Risk Factor Category  - - High Fall Risk High Fall Risk -  Risk for fall due to : - - - History of fall(s);Impaired mobility -   Functional Status Survey:    Vitals:   11/30/17 0952  BP: 130/77  Pulse: 72  Resp: 18  Temp: (!) 97.3 F (36.3 C)  TempSrc: Oral  SpO2: 94%  Weight: 162 lb 8 oz (73.7 kg)  Height: 5\' 8"  (1.727 m)  Body mass index is 24.71 kg/m.   Wt Readings from Last 3 Encounters:  11/30/17 162 lb 8 oz (73.7 kg)  11/16/17 165 lb 8 oz (75.1 kg)  11/11/17 165 lb 8 oz (75.1 kg)   Physical Exam  Constitutional:  Frail, elderly male in no acute distress  HENT:  Head: Normocephalic and atraumatic.  Nose: Nose normal.  Mouth/Throat: Oropharynx is clear and moist. No oropharyngeal exudate.  Wet quality voice  Eyes: Conjunctivae are normal. Pupils are equal, round, and reactive to light. Right eye exhibits no discharge. Left eye exhibits no discharge.  Ectropion present  Neck: Normal range of motion. Neck supple.  Cardiovascular: Normal rate and regular rhythm.  Pulmonary/Chest: Effort normal. He has no wheezes. He has  no rales.  Poor air movement bilaterally  Abdominal: Soft. Bowel sounds are normal. There is no tenderness. There is no guarding.  Musculoskeletal:  Able to move all 4 extremities, ROM to shoulders limited with pain, needs assistance with transfers, ambulates on wheelchair, ted hose to both legs with leg edema present  Lymphadenopathy:    He has no cervical adenopathy.  Neurological: He is alert.  Oriented to place and person only  Skin: Skin is warm and dry. He is not diaphoretic.  geri sleeves to both arms  Psychiatric:  Pleasantly confused    Labs reviewed: Recent Labs    01/26/17 08/03/17  NA 138 136*  K 4.8 4.1  BUN 24* 23*  CREATININE 1.3 1.0  CALCIUM  --  8.6   Recent Labs    01/26/17 08/03/17  AST 21 17  ALT 13 12  ALKPHOS 84 74  PROT  --  6.0  ALBUMIN  --  3.5   Recent Labs    01/26/17 05/31/17 06/01/17  WBC 8.6 10.5 10.5  HGB 13.8 13.4* 13.4*  HCT 42 39* 39*  PLT 192 193 193   Lab Results  Component Value Date   TSH 1.83 03/19/2017   No results found for: HGBA1C Lab Results  Component Value Date   CHOL 173 11/12/2015   HDL 44 11/12/2015   LDLCALC 109 11/12/2015   TRIG 102 11/12/2015    Significant Diagnostic Results in last 30 days:  No results found.  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 Continue tylenol 500 mg tid and biofreeze tid with prn in between as well. Continue AROM exercise with restorative team as tolerated.   BPH With urinary incontinence. Continue finasteride for now with flomax.   Glaucoma Continue his current eye drops regimen, eye care  Dysphonia With dysphagia, supportive care  Dysphagia High aspiration risk, continue nectar thick feed, supervision with feeding, supportive care  Anemia of chronic disease Low but stable h&h, recheck CBC    Family/ staff Communication: reviewed care plan with patient and charge nurse.    Labs/tests ordered:  Cbc, cmp   Blanchie Serve, MD Internal Medicine [facility] [address] [place], [place] [address] Cell Phone (Monday-Friday 8 am - 5 pm): [phone] On Call: [phone] and follow prompts after 5 pm and on weekends Office Phone: [phone] Office Fax: [phone]

## 2017-12-29 ENCOUNTER — Encounter: Payer: Self-pay | Admitting: Family

## 2017-12-29 ENCOUNTER — Non-Acute Institutional Stay (SKILLED_NURSING_FACILITY): Payer: PPO | Admitting: Family

## 2017-12-29 DIAGNOSIS — R1314 Dysphagia, pharyngoesophageal phase: Secondary | ICD-10-CM | POA: Diagnosis not present

## 2017-12-29 DIAGNOSIS — R634 Abnormal weight loss: Secondary | ICD-10-CM

## 2017-12-29 DIAGNOSIS — R269 Unspecified abnormalities of gait and mobility: Secondary | ICD-10-CM

## 2017-12-29 DIAGNOSIS — K5901 Slow transit constipation: Secondary | ICD-10-CM

## 2017-12-29 DIAGNOSIS — N400 Enlarged prostate without lower urinary tract symptoms: Secondary | ICD-10-CM

## 2017-12-29 DIAGNOSIS — M19012 Primary osteoarthritis, left shoulder: Secondary | ICD-10-CM | POA: Diagnosis not present

## 2017-12-29 NOTE — Progress Notes (Signed)
Location:  [facility] Room Number: 7 Place of Service:  SNF (31) Provider: Rainah Kirshner FNP-C   Blanchie Serve, MD  Patient Care Team: Blanchie Serve, MD as PCP - General (Internal Medicine) Melina Modena, [facility] [name], MD as Consulting Physician (Ophthalmology) [name], MD as Consulting Physician (Gastroenterology) [name], MD as Consulting Physician (Urology) [name], MD as Consulting Physician (Dermatology) Castella Lerner, Nelda Bucks, NP as Nurse Practitioner (Family Medicine)  Extended Emergency Contact Information Primary Emergency Contact: Cid,Barbara Address: [address]          [place], [place] [address] [place] Phone: [phone] Relation: Spouse Secondary Emergency Contact: Shattuck,Lynn  [place] Mobile Phone: [phone] Relation: Daughter  Code Status:  DNR Goals of care: Advanced Directive information Advanced Directives 12/29/2017  Does Patient Have a Medical Advance Directive? Yes  Type of Paramedic of [place];Out of facility DNR (pink MOST or yellow form);Living will  Does patient want to make changes to medical advance directive? -  Copy of [place] in Chart? Yes  Pre-existing out of facility DNR order (yellow form or pink MOST form) Yellow form placed in chart (order not valid for inpatient use);Pink MOST form placed in chart (order not valid for inpatient use)     Chief Complaint  Patient presents with  . Medical Management of Chronic Issues    HPI:  Pt is a 84 y.o. male seen today [name] for medical management of chronic diseases. He has a medical history of BPH,OA,dysphagia,PVD,CKD,dementia without any behavioral disturbance among other conditions.He denies any acute issues during visit.Facility Nurse reports no new concerns.He has had no recent fall episodes.Has had a 3 pounds weight loss over one month  Wt 159.4 lbs (09/30/2017); 165.5 lbs (10/31/2017);162.5 lbs (11/28/2017).   Past Medical History:  Diagnosis Date  . Colon tumor 1985  . Elevated prostate specific antigen (PSA) 05/13/2011  . Herpes zoster 05/13/2011  . Herpes zoster with ophthalmic complication 12/01/7423  . Hypertrophy of prostate without urinary obstruction and other lower urinary tract symptoms (LUTS) 09/20/2010  . Hypothyroid 01/29/2017  . Memory loss 03/25/2009  . 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 Nodular prostate without urinary obstruction 05/18/2012  . Osteoarthrosis, unspecified whether generalized or localized, unspecified site 09/20/2010  . Other and unspecified hyperlipidemia 09/20/2010  . Personal history of colonic polyps 09/20/2010  . Unspecified constipation 09/24/2010  . Unspecified glaucoma(365.9) 09/20/2010   bilateral   Past Surgical History:  Procedure Laterality Date  . APPENDECTOMY  1940  . CATARACT EXTRACTION W/ INTRAOCULAR LENS IMPLANT Right 2010   Dr. Ellie Lunch  . COLECTOMY  07/17/1997   sigmoid submucosal lipoma Dr. Oletta Lamas  . COLONOSCOPY  2000   normal  . COLONOSCOPY  04/21/2002   polypectomy  . COLONOSCOPY  06/10/2004   no polyps  . SKIN CANCER EXCISION Right 05/19/2013   ear Dr. Dannette Barbara  . TONSILLECTOMY  1938  . TUMOR REMOVAL  1993   Removal of Benign Intestinal Tumor   . VASECTOMY  1972    No Known Allergies  Allergies as of 12/29/2017   No Known Allergies     Medication List        Accurate as of 12/29/17  6:23 PM. Always use your most recent med list.          BIOFREEZE 4 % Gel Generic drug:  Menthol (Topical Analgesic) Apply 1 application topically 3 (three) times  daily. Apply to the left shoulder   BIOFREEZE 4 % Gel Generic drug:  Menthol (Topical Analgesic) Apply 1 application topically 2 (two) times daily as needed.   bisacodyl 10 MG suppository Commonly known as:  DULCOLAX Place 10 mg rectally every other day. As needed   CENTRUM tablet Take 1  tablet by mouth daily.   chlorhexidine 0.12 % solution Commonly known as:  PERIDEX Use as directed in the mouth or throat at bedtime. 1 teaspoon.   dorzolamide-timolol 22.3-6.8 MG/ML ophthalmic solution Commonly known as:  COSOPT Place 1 drop into both eyes 2 (two) times daily. 8PM and 8PM   finasteride 5 MG tablet Commonly known as:  PROSCAR Take 5 mg by mouth daily.   FLUORIDEX DAILY DEFENSE 1.1 % Gel dental gel Generic drug:  sodium fluoride Place 1 application onto teeth at bedtime.   hydrocortisone cream 1 % Apply 1 application topically as needed for itching.   latanoprost 0.005 % ophthalmic solution Commonly known as:  XALATAN Place 1 drop into both eyes at bedtime.   senna-docusate 8.6-50 MG tablet Commonly known as:  Senokot-S Take 1 tablet by mouth 2 (two) times daily.   tamsulosin 0.4 MG Caps capsule Commonly known as:  FLOMAX Take 0.4 mg by mouth daily.   zinc oxide 20 % ointment Apply 1 application topically 3 (three) times daily as needed. Apply to buttocks       Review of Systems  Constitutional: Negative for activity change, appetite change, chills, fatigue and fever.       3 pound weight change   HENT: Positive for hearing loss, trouble swallowing and voice change. Negative for congestion, rhinorrhea, sinus pressure, sinus pain, sneezing and sore throat.   Eyes: Positive for visual disturbance. Negative for pain, discharge, redness and itching.       Wears eye glasses   Respiratory: Negative for cough, chest tightness, shortness of breath and wheezing.   Cardiovascular: Positive for leg swelling. Negative for chest pain and palpitations.  Gastrointestinal: Negative for abdominal distention, abdominal pain, constipation, diarrhea, nausea and vomiting.  Endocrine: Negative for cold intolerance, heat intolerance, polydipsia, polyphagia and polyuria.  Genitourinary: Negative for dysuria, frequency and urgency.  Musculoskeletal: Positive for arthralgias  and gait problem.  Skin: Negative for color change, pallor and rash.  Neurological: Negative for dizziness, light-headedness and headaches.  Hematological: Does not bruise/bleed easily.  Psychiatric/Behavioral: Negative for agitation, confusion and sleep disturbance. The patient is not nervous/anxious.     Immunization History  Administered Date(s) Administered  . Influenza Whole 06/29/2012, 06/30/2013  . Influenza-Unspecified 07/13/2014, 07/10/2015, 07/10/2016, 07/09/2017  . PPD Test 07/02/2016, 07/16/2016  . Pneumococcal Conjugate-13 05/20/2016  . Pneumococcal Polysaccharide-23 09/30/1995  . Td 09/29/1994  . Tdap 05/04/2017  . Zoster 09/30/2007  . Zoster Recombinat (Shingrix) 07/22/2017   Pertinent  Health Maintenance Due  Topic Date Due  . INFLUENZA VACCINE  04/29/2018  . PNA vac Low Risk Adult  Completed   Fall Risk  05/01/2017 07/03/2016 05/20/2016 02/05/2016 11/20/2015  Falls in the past year? Yes Yes Yes Yes Yes  Number falls in past yr: 2 or more 2 or more 2 or more 2 or more 1  Comment - - 10 times in past 5 months - -  Injury with Fall? No Yes No Yes Yes  Comment - 07/03/16 laceration forehead, subdural hematome - - scape on elbow  Risk Factor Category  - - High Fall Risk High Fall Risk -  Risk for fall due to : - - -  History of fall(s);Impaired mobility -    Vitals:   12/29/17 1042  BP: (!) 109/55  Pulse: 75  Resp: 18  Temp: (!) 96.9 F (36.1 C)  SpO2: 98%  Weight: 162 lb 8 oz (73.7 kg)  Height: 5\' 8"  (1.727 m)   Body mass index is 24.71 kg/m. Physical Exam  Constitutional:  Frail elderly in no acute distress   HENT:  Head: Normocephalic.  Right Ear: External ear normal.  Left Ear: External ear normal.  Nose: Nose normal.  Mouth/Throat: Oropharynx is clear and moist. No oropharyngeal exudate.  Eyes: Pupils are equal, round, and reactive to light. Conjunctivae and EOM are normal. Right eye exhibits no discharge. Left eye exhibits no discharge. No scleral  icterus.  Eye glasses in place   Neck: Normal range of motion. No JVD present. No thyromegaly present.  Cardiovascular: Normal rate, regular rhythm, normal heart sounds and intact distal pulses. Exam reveals no gallop and no friction rub.  No murmur heard. Pulmonary/Chest: Effort normal and breath sounds normal. No respiratory distress. He has no wheezes. He has no rales.  Abdominal: Soft. Bowel sounds are normal. He exhibits no distension. There is no tenderness. There is no rebound and no guarding.  Musculoskeletal:  Moves x 4 extremities except left shoulder ROM limited due to pain.self propels on wheelchair.bilateral lower extremities edema knee high ted hose in place    Lymphadenopathy:    He has no cervical adenopathy.  Neurological: He is alert. Gait abnormal.  Skin: Skin is warm and dry. No rash noted. No erythema. No pallor.  Psychiatric: He has a normal mood and affect. His behavior is normal. Judgment and thought content normal. He exhibits abnormal recent memory.  Dysphonia    Labs reviewed: Recent Labs    01/26/17 08/03/17  NA 138 136*  K 4.8 4.1  BUN 24* 23*  CREATININE 1.3 1.0  CALCIUM  --  8.6   Recent Labs    01/26/17 08/03/17  AST 21 17  ALT 13 12  ALKPHOS 84 74  PROT  --  6.0  ALBUMIN  --  3.5   Recent Labs    01/26/17 05/31/17 06/01/17  WBC 8.6 10.5 10.5  HGB 13.8 13.4* 13.4*  HCT 42 39* 39*  PLT 192 193 193   Lab Results  Component Value Date   TSH 1.83 03/19/2017   No results found for: HGBA1C Lab Results  Component Value Date   CHOL 173 11/12/2015   HDL 44 11/12/2015   LDLCALC 109 11/12/2015   TRIG 102 11/12/2015    Significant Diagnostic Results in last 30 days:  No results found.  Assessment/Plan 1. Dysphagia No cough noted.He remains high risk for aspiration.continue on nectar thicken liquid.continue on aspiration precautions.continue to follow up with speech therapy as needed.    2. Primary osteoarthritis of left  shoulder Continue on Biofreeze three times daily.  3. Benign prostatic hyperplasia without lower urinary tract symptoms Continue on finasteride 5 tablet daily and Flomax 0.4 mg capsule daily.  4. Abnormality of gait No recent fall episode.continue to monitor.fall and safety precautions.  5. Slow transit constipation Current regimen effective.continue to encourage oral intake and hydration.  6. Weight loss  3 pounds weight loss over one month.  Wt 159.4 lbs (09/30/2017) 165.5 lbs (10/31/2017) 162.5 lbs (11/28/2017) Continue to encourage oral intake,MVI and supplements.continue to follow up with Registered Dietician.continue to monitor weight.   Family/ staff Communication: Reviewed plan of care with patient and facility Nurse  Labs/tests  ordered: None   Sandrea Hughs, NP

## 2018-01-27 ENCOUNTER — Non-Acute Institutional Stay (SKILLED_NURSING_FACILITY): Payer: PPO | Admitting: Family

## 2018-01-27 ENCOUNTER — Encounter: Payer: Self-pay | Admitting: Family

## 2018-01-27 DIAGNOSIS — K5901 Slow transit constipation: Secondary | ICD-10-CM | POA: Diagnosis not present

## 2018-01-27 DIAGNOSIS — N400 Enlarged prostate without lower urinary tract symptoms: Secondary | ICD-10-CM | POA: Diagnosis not present

## 2018-01-27 DIAGNOSIS — I739 Peripheral vascular disease, unspecified: Secondary | ICD-10-CM

## 2018-01-27 DIAGNOSIS — R1314 Dysphagia, pharyngoesophageal phase: Secondary | ICD-10-CM | POA: Diagnosis not present

## 2018-01-27 DIAGNOSIS — M19012 Primary osteoarthritis, left shoulder: Secondary | ICD-10-CM | POA: Diagnosis not present

## 2018-01-27 NOTE — Progress Notes (Signed)
Location:  [facility] Room Number: 7 Place of Service:  SNF (31) Provider: Dinah Ngetich FNP-C   Blanchie Serve, MD  Patient Care Team: Blanchie Serve, MD as PCP - General (Internal Medicine) Melina Modena, [facility] [name], MD as Consulting Physician (Ophthalmology) [name], MD as Consulting Physician (Gastroenterology) [name], MD as Consulting Physician (Urology) [name], MD as Consulting Physician (Dermatology) Ngetich, Nelda Bucks, NP as Nurse Practitioner (Family Medicine)  Extended Emergency Contact Information Primary Emergency Contact: Marin,Barbara Address: [address]          [place], [place] [address] [place] Phone: [phone] Relation: Spouse Secondary Emergency Contact: Shattuck,Lynn  [place] Mobile Phone: [phone] Relation: Daughter  Code Status:  DNR Goals of care: Advanced Directive information Advanced Directives 01/27/2018  Does Patient Have a Medical Advance Directive? Yes  Type of Paramedic of [place];Living will;Out of facility DNR (pink MOST or yellow form)  Does patient want to make changes to medical advance directive? No - Patient declined  Copy of [place] in Chart? Yes  Pre-existing out of facility DNR order (yellow form or pink MOST form) Yellow form placed in chart (order not valid for inpatient use);Pink MOST form placed in chart (order not valid for inpatient use)     Chief Complaint  Patient presents with  . Medical Management of Chronic Issues    Routine Visit     HPI:  Pt is a 84 y.o. male seen today [place] for medical management of chronic diseases.He has a medical history of  PVD,CKD,BPH,dysphagia,OA,dementia without behavioral disturbance.he is seen in his room today.He denies any acute issues during visit.Facility Nurse reports no new concerns.No recent fall episodes  reported.   Past Medical History:  Diagnosis Date  . Colon tumor 1985  . Elevated prostate specific antigen (PSA) 05/13/2011  . Herpes zoster 05/13/2011  . Herpes zoster with ophthalmic complication 11/07/5282  . Hypertrophy of prostate without urinary obstruction and other lower urinary tract symptoms (LUTS) 09/20/2010  . Hypothyroid 01/29/2017  . Memory loss 03/25/2009  . 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 Nodular prostate without urinary obstruction 05/18/2012  . Osteoarthrosis, unspecified whether generalized or localized, unspecified site 09/20/2010  . Other and unspecified hyperlipidemia 09/20/2010  . Personal history of colonic polyps 09/20/2010  . Unspecified constipation 09/24/2010  . Unspecified glaucoma(365.9) 09/20/2010   bilateral   Past Surgical History:  Procedure Laterality Date  . APPENDECTOMY  1940  . CATARACT EXTRACTION W/ INTRAOCULAR LENS IMPLANT Right 2010   Dr. Ellie Lunch  . COLECTOMY  07/17/1997   sigmoid submucosal lipoma Dr. Oletta Lamas  . COLONOSCOPY  2000   normal  . COLONOSCOPY  04/21/2002   polypectomy  . COLONOSCOPY  06/10/2004   no polyps  . SKIN CANCER EXCISION Right 05/19/2013   ear Dr. Dannette Barbara  . TONSILLECTOMY  1938  . TUMOR REMOVAL  1993   Removal of Benign Intestinal Tumor   . VASECTOMY  1972    No Known Allergies  Allergies as of 01/27/2018   No Known Allergies     Medication List        Accurate as of 01/27/18 11:42 AM. Always use your most recent med list.          BIOFREEZE 4 % Gel Generic drug:  Menthol (Topical Analgesic) Apply 1 application topically 3 (three) times daily. Apply to the left shoulder   BIOFREEZE 4 %  Gel Generic drug:  Menthol (Topical Analgesic) Apply 1 application topically 2 (two) times daily as needed.   bisacodyl 10 MG suppository Commonly known as:  DULCOLAX Place 10 mg rectally every other day. As needed   CENTRUM tablet Take 1 tablet by mouth daily.   chlorhexidine 0.12 %  solution Commonly known as:  PERIDEX Use as directed in the mouth or throat at bedtime. 1 teaspoon.   dorzolamide-timolol 22.3-6.8 MG/ML ophthalmic solution Commonly known as:  COSOPT Place 1 drop into both eyes 2 (two) times daily. 8PM and 8PM   finasteride 5 MG tablet Commonly known as:  PROSCAR Take 5 mg by mouth daily.   FLUORIDEX DAILY DEFENSE 1.1 % Gel dental gel Generic drug:  sodium fluoride Place 1 application onto teeth at bedtime.   hydrocortisone cream 1 % Apply 1 application topically as needed for itching.   latanoprost 0.005 % ophthalmic solution Commonly known as:  XALATAN Place 1 drop into both eyes at bedtime.   senna-docusate 8.6-50 MG tablet Commonly known as:  Senokot-S Take 1 tablet by mouth 2 (two) times daily.   tamsulosin 0.4 MG Caps capsule Commonly known as:  FLOMAX Take 0.4 mg by mouth daily.   zinc oxide 20 % ointment Apply 1 application topically 3 (three) times daily as needed. Apply to buttocks       Review of Systems  Constitutional: Negative for appetite change, chills, fatigue and fever.  HENT: Positive for hearing loss and trouble swallowing. Negative for congestion, rhinorrhea, sinus pressure, sinus pain, sneezing and sore throat.   Eyes: Negative for discharge, redness and itching.  Respiratory: Negative for cough, chest tightness, shortness of breath and wheezing.   Cardiovascular: Positive for leg swelling. Negative for chest pain and palpitations.  Gastrointestinal: Negative for abdominal distention, abdominal pain, constipation, diarrhea, nausea and vomiting.  Endocrine: Negative for cold intolerance, heat intolerance, polydipsia, polyphagia and polyuria.  Genitourinary: Negative for dysuria, flank pain and urgency.       Urine Incontinent   Musculoskeletal: Positive for arthralgias and gait problem.       Left shoulder pain under control with biofreeze  Skin: Negative for color change, pallor, rash and wound.  Neurological:  Negative for dizziness, light-headedness and headaches.  Psychiatric/Behavioral: Negative for agitation and sleep disturbance. The patient is not nervous/anxious.     Immunization History  Administered Date(s) Administered  . Influenza Whole 06/29/2012, 06/30/2013  . Influenza-Unspecified 07/13/2014, 07/10/2015, 07/10/2016, 07/09/2017  . PPD Test 07/02/2016, 07/16/2016  . Pneumococcal Conjugate-13 05/20/2016  . Pneumococcal Polysaccharide-23 09/30/1995  . Td 09/29/1994  . Tdap 05/04/2017  . Zoster 09/30/2007  . Zoster Recombinat (Shingrix) 07/22/2017   Pertinent  Health Maintenance Due  Topic Date Due  . INFLUENZA VACCINE  04/29/2018  . PNA vac Low Risk Adult  Completed   Fall Risk  05/01/2017 07/03/2016 05/20/2016 02/05/2016 11/20/2015  Falls in the past year? Yes Yes Yes Yes Yes  Number falls in past yr: 2 or more 2 or more 2 or more 2 or more 1  Comment - - 10 times in past 5 months - -  Injury with Fall? No Yes No Yes Yes  Comment - 07/03/16 laceration forehead, subdural hematome - - scape on elbow  Risk Factor Category  - - High Fall Risk High Fall Risk -  Risk for fall due to : - - - History of fall(s);Impaired mobility -    Vitals:   01/27/18 1008  BP: 110/66  Pulse: 61  Resp: 16  Temp: 97.8 F (36.6 C)  TempSrc: Oral  SpO2: 97%  Weight: 161 lb 4.8 oz (73.2 kg)  Height: 5\' 8"  (1.727 m)   Body mass index is 24.53 kg/m.  Wt Readings from Last 3 Encounters:  01/27/18 161 lb 4.8 oz (73.2 kg)  12/29/17 162 lb 8 oz (73.7 kg)  11/30/17 162 lb 8 oz (73.7 kg)    Physical Exam  Constitutional:  Tall built frail elderly in no acute distress   HENT:  Head: Normocephalic.  Right Ear: External ear normal.  Left Ear: External ear normal.  Mouth/Throat: Oropharynx is clear and moist. No oropharyngeal exudate.  Eyes: Pupils are equal, round, and reactive to light. EOM are normal. Right eye exhibits no discharge. Left eye exhibits no discharge. No scleral icterus.  Neck:  Normal range of motion. No JVD present. No thyromegaly present.  Cardiovascular: Normal rate, regular rhythm, normal heart sounds and intact distal pulses. Exam reveals no gallop and no friction rub.  No murmur heard. Pulmonary/Chest: Effort normal and breath sounds normal. No stridor. No respiratory distress. He has no wheezes. He has no rales.  Abdominal: Soft. Bowel sounds are normal. He exhibits no distension and no mass. There is no tenderness. There is no rebound and no guarding.  Genitourinary:  Genitourinary Comments: Incontinent   Musculoskeletal: He exhibits no tenderness.  Limited ROM to left shoulder due to pain. unsteady gait; self propel on wheelchair. Bilateral lower extremities pitting edema has improved compared to previous visit.Knee high ted hose in place during visit.    Lymphadenopathy:    He has no cervical adenopathy.  Neurological: Gait abnormal.  Alert and oriented to person and place   Skin: Skin is warm and dry. No rash noted. No erythema. No pallor.  Psychiatric: He has a normal mood and affect. His behavior is normal. Judgment and thought content normal. Cognition and memory are impaired.  Dysphonia    Labs reviewed: Recent Labs    08/03/17  NA 136*  K 4.1  BUN 23*  CREATININE 1.0  CALCIUM 8.6   Recent Labs    08/03/17  AST 17  ALT 12  ALKPHOS 74  PROT 6.0  ALBUMIN 3.5   Recent Labs    05/31/17 06/01/17  WBC 10.5 10.5  HGB 13.4* 13.4*  HCT 39* 39*  PLT 193 193   Lab Results  Component Value Date   TSH 1.83 03/19/2017   Lab Results  Component Value Date   CHOL 173 11/12/2015   HDL 44 11/12/2015   LDLCALC 109 11/12/2015   TRIG 102 11/12/2015    Significant Diagnostic Results in last 30 days:  No results found.  Assessment/Plan 1. Benign prostatic hyperplasia No signs of urine retention reported.continue on finasteride 5 mg tablet daily and tamsulosin 0.4 mg capsule daily.continue to monitor.  2. Primary osteoarthritis of left  shoulder Pain under controlled.continue on Biofreeze gel and restorative exercise.  3. Dysphagia, pharyngoesophageal phase Remains high risk for aspiration.continue on thicken liquids.follow up with speech therapy.continue on aspiration precautions.   4. Slow transit constipation Current regimen effective.continue to encourage oral intake and hydration.    5. PVD (peripheral vascular disease) No ulceration noted.Bilateral lower extremities edema has improved.continue on knee high ted hose.    Family/ staff Communication: Reviewed plan of care with patient and facility Nurse.   Labs/tests ordered: None   Dinah C Ngetich, NP

## 2018-02-11 ENCOUNTER — Encounter: Payer: Self-pay | Admitting: Family

## 2018-02-11 ENCOUNTER — Non-Acute Institutional Stay (SKILLED_NURSING_FACILITY): Payer: PPO | Admitting: Family

## 2018-02-11 DIAGNOSIS — S51011A Laceration without foreign body of right elbow, initial encounter: Secondary | ICD-10-CM

## 2018-02-12 NOTE — Progress Notes (Signed)
Location:  [place] Room Number: 7 Place of Service:  SNF (31) Provider: Dinah Ngetich FNP-C  Blanchie Serve, MD  Patient Care Team: Blanchie Serve, MD as PCP - General (Internal Medicine) Melina Modena, [facility] [name], MD as Consulting Physician (Ophthalmology) [name], MD as Consulting Physician (Gastroenterology) [name], MD as Consulting Physician (Urology) [name], MD as Consulting Physician (Dermatology) Ngetich, Nelda Bucks, NP as Nurse Practitioner (Family Medicine)  Extended Emergency Contact Information Primary Emergency Contact: Son,Barbara Address: [address]          [place], [place] [address] [place] Phone: [phone] Relation: Spouse Secondary Emergency Contact: Shattuck,Lynn  [place] Mobile Phone: [phone] Relation: Daughter  Code Status:  DNR Goals of care: Advanced Directive information Advanced Directives 02/11/2018  Does Patient Have a Medical Advance Directive? Yes  Type of Paramedic of [place];Out of facility DNR (pink MOST or yellow form);Living will  Does patient want to make changes to medical advance directive? -  Copy of [place] in Chart? Yes  Pre-existing out of facility DNR order (yellow form or pink MOST form) Yellow form placed in chart (order not valid for inpatient use);Pink MOST form placed in chart (order not valid for inpatient use)     Chief Complaint  Patient presents with  . Acute Visit    skin tear    HPI:  Pt is a 84 y.o. male seen today at [facility] for an acute visit for evaluation of right elbow skin tear.He is seen in his room today.he denies any acute issues.Facility Nurse reports patient's right elbow skin tear noted during his  Shower.Skin well approximated and steri-strips was applied by Nurse per standing orders.No bleeding noted.No fever or chills noted.he is not on  any anticoagulation or ASA.     Past Medical History:  Diagnosis Date  . Colon tumor 1985  . Elevated prostate specific antigen (PSA) 05/13/2011  . Herpes zoster 05/13/2011  . Herpes zoster with ophthalmic complication 06/07/8337  . Hypertrophy of prostate without urinary obstruction and other lower urinary tract symptoms (LUTS) 09/20/2010  . Hypothyroid 01/29/2017  . Memory loss 03/25/2009  . 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 Nodular prostate without urinary obstruction 05/18/2012  . Osteoarthrosis, unspecified whether generalized or localized, unspecified site 09/20/2010  . Other and unspecified hyperlipidemia 09/20/2010  . Personal history of colonic polyps 09/20/2010  . Unspecified constipation 09/24/2010  . Unspecified glaucoma(365.9) 09/20/2010   bilateral   Past Surgical History:  Procedure Laterality Date  . APPENDECTOMY  1940  . CATARACT EXTRACTION W/ INTRAOCULAR LENS IMPLANT Right 2010   Dr. Ellie Lunch  . COLECTOMY  07/17/1997   sigmoid submucosal lipoma Dr. Oletta Lamas  . COLONOSCOPY  2000   normal  . COLONOSCOPY  04/21/2002   polypectomy  . COLONOSCOPY  06/10/2004   no polyps  . SKIN CANCER EXCISION Right 05/19/2013   ear Dr. Dannette Barbara  . TONSILLECTOMY  1938  . TUMOR REMOVAL  1993   Removal of Benign Intestinal Tumor   . VASECTOMY  1972    No Known Allergies  Outpatient Encounter Medications as of 02/11/2018  Medication Sig  . bisacodyl (DULCOLAX) 10 MG suppository Place 10 mg rectally every other day. As needed  . chlorhexidine (PERIDEX) 0.12 % solution Use as directed in the mouth or throat at bedtime. 1 teaspoon.  . dorzolamide-timolol (COSOPT) 22.3-6.8 MG/ML ophthalmic solution Place 1 drop into  both eyes 2 (two) times daily. 8PM and 8PM  . finasteride (PROSCAR) 5 MG tablet Take 5 mg by mouth daily.   . hydrocortisone cream 1 % Apply 1 application topically as needed for itching.  . latanoprost (XALATAN) 0.005 % ophthalmic solution Place 1 drop into  both eyes at bedtime.   . Menthol, Topical Analgesic, (BIOFREEZE) 4 % GEL Apply 1 application topically 3 (three) times daily. Apply to the left shoulder   . Menthol, Topical Analgesic, (BIOFREEZE) 4 % GEL Apply 1 application topically 2 (two) times daily as needed.  . Multiple Vitamins-Minerals (CENTRUM) tablet Take 1 tablet by mouth daily.  Marland Kitchen senna-docusate (SENOKOT-S) 8.6-50 MG tablet Take 1 tablet by mouth 2 (two) times daily.   . sodium fluoride (FLUORIDEX DAILY DEFENSE) 1.1 % GEL dental gel Place 1 application onto teeth at bedtime.  . tamsulosin (FLOMAX) 0.4 MG CAPS capsule Take 0.4 mg by mouth daily.   Marland Kitchen zinc oxide 20 % ointment Apply 1 application topically 3 (three) times daily as needed. Apply to buttocks   No facility-administered encounter medications on file as of 02/11/2018.     Review of Systems  Constitutional: Negative for chills, fatigue and fever.  Respiratory: Negative for cough, chest tightness, shortness of breath and wheezing.   Cardiovascular: Positive for leg swelling. Negative for chest pain and palpitations.  Gastrointestinal: Negative for abdominal distention, abdominal pain, constipation, diarrhea, nausea and vomiting.  Genitourinary: Negative for dysuria, flank pain, frequency and urgency.  Musculoskeletal: Positive for gait problem.  Skin: Negative for color change, pallor and rash.       Right elbow skin tear per HPI   Neurological: Negative for dizziness, syncope, light-headedness and headaches.  Psychiatric/Behavioral: Negative for agitation, confusion and sleep disturbance. The patient is not nervous/anxious.     Immunization History  Administered Date(s) Administered  . Influenza Whole 06/29/2012, 06/30/2013  . Influenza-Unspecified 07/13/2014, 07/10/2015, 07/10/2016, 07/09/2017  . PPD Test 07/02/2016, 07/16/2016  . Pneumococcal Conjugate-13 05/20/2016  . Pneumococcal Polysaccharide-23 09/30/1995  . Td 09/29/1994  . Tdap 05/04/2017  . Zoster  09/30/2007  . Zoster Recombinat (Shingrix) 07/22/2017   Pertinent  Health Maintenance Due  Topic Date Due  . INFLUENZA VACCINE  04/29/2018  . PNA vac Low Risk Adult  Completed   Fall Risk  05/01/2017 07/03/2016 05/20/2016 02/05/2016 11/20/2015  Falls in the past year? Yes Yes Yes Yes Yes  Number falls in past yr: 2 or more 2 or more 2 or more 2 or more 1  Comment - - 10 times in past 5 months - -  Injury with Fall? No Yes No Yes Yes  Comment - 07/03/16 laceration forehead, subdural hematome - - scape on elbow  Risk Factor Category  - - High Fall Risk High Fall Risk -  Risk for fall due to : - - - History of fall(s);Impaired mobility -    Vitals:   02/11/18 1315  BP: 130/71  Pulse: 66  Resp: 18  Temp: (!) 97 F (36.1 C)  SpO2: 96%  Weight: 160 lb 1.6 oz (72.6 kg)  Height: 5\' 8"  (1.727 m)   Body mass index is 24.34 kg/m. Physical Exam  Constitutional:  Frail elderly in no acute distress   HENT:  Head: Normocephalic.  Right Ear: External ear normal.  Left Ear: External ear normal.  Mouth/Throat: Oropharynx is clear and moist. No oropharyngeal exudate.  Eyes: Pupils are equal, round, and reactive to light. Conjunctivae and EOM are normal. Right eye exhibits no discharge.  Left eye exhibits no discharge. No scleral icterus.  Neck: Normal range of motion. No JVD present. No thyromegaly present.  Cardiovascular: Normal rate, regular rhythm, normal heart sounds and intact distal pulses. Exam reveals no gallop and no friction rub.  No murmur heard. Pulmonary/Chest: Effort normal and breath sounds normal. No respiratory distress. He has no wheezes. He has no rales.  Abdominal: Soft. Bowel sounds are normal. He exhibits no distension and no mass. There is no tenderness. There is no rebound and no guarding.  Musculoskeletal: He exhibits no tenderness.  Moves x 4 extremities.bilateral lower extremities trace edema.  Lymphadenopathy:    He has no cervical adenopathy.  Skin: Skin is warm and  dry. No rash noted. No erythema. No pallor.  Right elbow skin tear with steri-strips intact surrounding skin tissues without any signs of infection.old  dark purple bruise progressive healing.  Psychiatric: He has a normal mood and affect. His behavior is normal.  Nursing note and vitals reviewed.  Labs reviewed: Recent Labs    08/03/17  NA 136*  K 4.1  BUN 23*  CREATININE 1.0  CALCIUM 8.6   Recent Labs    08/03/17  AST 17  ALT 12  ALKPHOS 74  PROT 6.0  ALBUMIN 3.5   Recent Labs    05/31/17 06/01/17  WBC 10.5 10.5  HGB 13.4* 13.4*  HCT 39* 39*  PLT 193 193   Lab Results  Component Value Date   TSH 1.83 03/19/2017   No results found for: HGBA1C Lab Results  Component Value Date   CHOL 173 11/12/2015   HDL 44 11/12/2015   LDLCALC 109 11/12/2015   TRIG 102 11/12/2015    Significant Diagnostic Results in last 30 days:  No results found.  Assessment/Plan  Skin tear of right elbow without complication, initial encounter Afebrile.Egdes well approximated .skin tear with steri-strips intact surrounding skin tissues without any signs of infection.old  dark purple bruise progressive healing.continue to monitor site for signs of infections.   Family/ staff Communication: Reviewed plan of care with patient and facility Nurse.   Labs/tests ordered: None   Dinah C Ngetich, NP

## 2018-03-01 ENCOUNTER — Non-Acute Institutional Stay (SKILLED_NURSING_FACILITY): Payer: PPO | Admitting: Internal Medicine

## 2018-03-01 ENCOUNTER — Encounter: Payer: Self-pay | Admitting: Internal Medicine

## 2018-03-01 DIAGNOSIS — I739 Peripheral vascular disease, unspecified: Secondary | ICD-10-CM | POA: Diagnosis not present

## 2018-03-01 DIAGNOSIS — K5901 Slow transit constipation: Secondary | ICD-10-CM

## 2018-03-01 DIAGNOSIS — N138 Other obstructive and reflux uropathy: Secondary | ICD-10-CM

## 2018-03-01 DIAGNOSIS — R627 Adult failure to thrive: Secondary | ICD-10-CM | POA: Diagnosis not present

## 2018-03-01 DIAGNOSIS — F0390 Unspecified dementia without behavioral disturbance: Secondary | ICD-10-CM

## 2018-03-01 DIAGNOSIS — N401 Enlarged prostate with lower urinary tract symptoms: Secondary | ICD-10-CM | POA: Diagnosis not present

## 2018-03-01 NOTE — Progress Notes (Signed)
Location:  [place] Room Number: 7 Place of Service:  SNF [phone]) Provider:  Blanchie Serve MD  Blanchie Serve, MD  Patient Care Team: Blanchie Serve, MD as PCP - General (Internal Medicine) Melina Modena, [facility] [name], MD as Consulting Physician (Ophthalmology) [name], MD as Consulting Physician (Gastroenterology) [name], MD as Consulting Physician (Urology) [name], MD as Consulting Physician (Dermatology) Ngetich, Nelda Bucks, NP as Nurse Practitioner [facility])  Extended Emergency Contact Information Primary Emergency Contact: Kumpf,Barbara Address: [address]          [place], [place] [address] [place] Phone: [phone] Relation: Spouse Secondary Emergency Contact: Winnifred Friar [place] Mobile Phone: [phone] Relation: Daughter  Code Status:  DNR  Goals of care: Advanced Directive information Advanced Directives 03/01/2018  Does Patient Have a Medical Advance Directive? Yes  Type of Paramedic of [place];Living will;Out of facility DNR (pink MOST or yellow form)  Does patient want to make changes to medical advance directive? No - Patient declined  Copy of [place] in Chart? Yes  Pre-existing out of facility DNR order (yellow form or pink MOST form) Yellow form placed in chart (order not valid for inpatient use);Pink MOST form placed in chart (order not valid for inpatient use)     Chief Complaint  Patient presents with  . Medical Management of Chronic Issues    Routine Visit     HPI:  Pt is a 84 y.o. male seen today for medical management of chronic diseases.  He is seen in his room today. HPI/ROS limited with his dementia and dysarthria. He appears in no acute distress this visit. Per nursing and family, he has been weak over the weekend. He has been requiring slightly more assistance with transfer per nursing  aid. He fed himself and ate 100% of his breakfast this am. At present he is out of bed and in his wheelchair. Per nursing he has been urinating frequently and has dark colored urine. No acute change of mental state reported. He continues to have pain to left shoulder. He tolerates eye drops for glaucoma well. He is working with restorative team for ROM exercises. He is incontinent with bowel and bladder. He needs 1 person minimum assistance with transfer. He needs extensive assistance with his ADLs except feeding. No fall reported. He is on dysphagia diet and remains a high risk for aspiration.    Past Medical History:  Diagnosis Date  . Colon tumor 1985  . Elevated prostate specific antigen (PSA) 05/13/2011  . Herpes zoster 05/13/2011  . Herpes zoster with ophthalmic complication 12/05/4663  . Hypertrophy of prostate without urinary obstruction and other lower urinary tract symptoms (LUTS) 09/20/2010  . Hypothyroid 01/29/2017  . Memory loss 03/25/2009  . 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 Nodular prostate without urinary obstruction 05/18/2012  . Osteoarthrosis, unspecified whether generalized or localized, unspecified site 09/20/2010  . Other and unspecified hyperlipidemia 09/20/2010  . Personal history of colonic polyps 09/20/2010  . Unspecified constipation 09/24/2010  . Unspecified glaucoma(365.9) 09/20/2010   bilateral   Past Surgical History:  Procedure Laterality Date  . APPENDECTOMY  1940  . CATARACT EXTRACTION W/ INTRAOCULAR LENS IMPLANT Right 2010   Dr. Ellie Lunch  . COLECTOMY  07/17/1997   sigmoid submucosal lipoma Dr. Oletta Lamas  . COLONOSCOPY  2000   normal  . COLONOSCOPY  04/21/2002   polypectomy  . COLONOSCOPY  06/10/2004  no polyps  . SKIN CANCER EXCISION Right 05/19/2013   ear Dr. Dannette Barbara  . TONSILLECTOMY  1938  . TUMOR REMOVAL  1993   Removal of Benign Intestinal Tumor   . VASECTOMY  1972    No Known Allergies  Outpatient Encounter Medications as of  03/01/2018  Medication Sig  . bisacodyl (DULCOLAX) 10 MG suppository Place 10 mg rectally every other day. As needed  . chlorhexidine (PERIDEX) 0.12 % solution Use as directed in the mouth or throat at bedtime. 1 teaspoon.  . dorzolamide-timolol (COSOPT) 22.3-6.8 MG/ML ophthalmic solution Place 1 drop into both eyes 2 (two) times daily. 8PM and 8PM  . finasteride (PROSCAR) 5 MG tablet Take 5 mg by mouth daily.   . hydrocortisone cream 1 % Apply 1 application topically as needed for itching.  . latanoprost (XALATAN) 0.005 % ophthalmic solution Place 1 drop into both eyes at bedtime.   . Menthol, Topical Analgesic, (BIOFREEZE) 4 % GEL Apply 1 application topically 3 (three) times daily. Apply to the left shoulder   . Menthol, Topical Analgesic, (BIOFREEZE) 4 % GEL Apply 1 application topically 2 (two) times daily as needed.  . Multiple Vitamins-Minerals (CENTRUM) tablet Take 1 tablet by mouth daily.  Marland Kitchen senna-docusate (SENOKOT-S) 8.6-50 MG tablet Take 1 tablet by mouth 2 (two) times daily.   . sodium fluoride (FLUORIDEX DAILY DEFENSE) 1.1 % GEL dental gel Place 1 application onto teeth at bedtime.  . tamsulosin (FLOMAX) 0.4 MG CAPS capsule Take 0.4 mg by mouth daily.   Marland Kitchen zinc oxide 20 % ointment Apply 1 application topically 3 (three) times daily as needed. Apply to buttocks   No facility-administered encounter medications on file as of 03/01/2018.     Review of Systems  Unable to perform ROS: Dementia (dysarthria)  Constitutional: Positive for fatigue. Negative for appetite change, chills, diaphoresis and fever.  HENT: Positive for hearing loss. Negative for congestion, mouth sores and rhinorrhea.   Respiratory: Negative for shortness of breath.   Cardiovascular: Negative for chest pain and palpitations.  Gastrointestinal: Negative for abdominal pain, diarrhea and vomiting.  Genitourinary:       Incontinent with urine  Skin: Negative for rash.  Neurological: Negative for dizziness.    Psychiatric/Behavioral: Positive for confusion.    Immunization History  Administered Date(s) Administered  . Influenza Whole 06/29/2012, 06/30/2013  . Influenza-Unspecified 07/13/2014, 07/10/2015, 07/10/2016, 07/09/2017  . PPD Test 07/02/2016, 07/16/2016  . Pneumococcal Conjugate-13 05/20/2016  . Pneumococcal Polysaccharide-23 09/30/1995  . Td 09/29/1994  . Tdap 05/04/2017  . Zoster 09/30/2007  . Zoster Recombinat (Shingrix) 07/22/2017   Pertinent  Health Maintenance Due  Topic Date Due  . INFLUENZA VACCINE  04/29/2018  . PNA vac Low Risk Adult  Completed   Fall Risk  05/01/2017 07/03/2016 05/20/2016 02/05/2016 11/20/2015  Falls in the past year? Yes Yes Yes Yes Yes  Number falls in past yr: 2 or more 2 or more 2 or more 2 or more 1  Comment - - 10 times in past 5 months - -  Injury with Fall? No Yes No Yes Yes  Comment - 07/03/16 laceration forehead, subdural hematome - - scape on elbow  Risk Factor Category  - - High Fall Risk High Fall Risk -  Risk for fall due to : - - - History of fall(s);Impaired mobility -   Functional Status Survey:    Vitals:   03/01/18 1054  BP: 140/68  Pulse: 74  Resp: 18  Temp: 97.9 F (36.6 C)  TempSrc: Oral  SpO2: 96%  Weight: 158 lb 6.4 oz (71.8 kg)  Height: 5\' 8"  (1.727 m)   Body mass index is 24.08 kg/m.   Wt Readings from Last 3 Encounters:  03/01/18 158 lb 6.4 oz (71.8 kg)  02/11/18 160 lb 1.6 oz (72.6 kg)  01/27/18 161 lb 4.8 oz (73.2 kg)   Physical Exam  Constitutional: He appears well-developed. No distress.  Frail, elderly male  HENT:  Head: Normocephalic and atraumatic.  Right Ear: External ear normal.  Left Ear: External ear normal.  Nose: Nose normal.  Mouth/Throat: Oropharynx is clear and moist.  Eyes: Pupils are equal, round, and reactive to light. Conjunctivae and EOM are normal. Right eye exhibits no discharge. Left eye exhibits no discharge.  Neck: Normal range of motion. Neck supple.  Cardiovascular: Normal  rate and regular rhythm.  Pulmonary/Chest: Effort normal and breath sounds normal. No respiratory distress. He has no wheezes. He has no rales.  Abdominal: Soft. Bowel sounds are normal. There is no tenderness. There is no guarding.  Musculoskeletal: He exhibits edema. He exhibits no tenderness.  Able to move all 4 extremities, weakness of lower extremities, wheelchair bound and needs assistance with transfer. Limited ROM to left shoulder  Lymphadenopathy:    He has no cervical adenopathy.  Neurological: He is alert.  Pleasantly confused, oriented only to self  Skin: Skin is warm and dry. He is not diaphoretic.  Psychiatric: He has a normal mood and affect.    Labs reviewed: Recent Labs    08/03/17  NA 136*  K 4.1  BUN 23*  CREATININE 1.0  CALCIUM 8.6   Recent Labs    08/03/17  AST 17  ALT 12  ALKPHOS 74  PROT 6.0  ALBUMIN 3.5   Recent Labs    05/31/17 06/01/17  WBC 10.5 10.5  HGB 13.4* 13.4*  HCT 39* 39*  PLT 193 193   Lab Results  Component Value Date   TSH 1.83 03/19/2017   No results found for: HGBA1C Lab Results  Component Value Date   CHOL 173 11/12/2015   HDL 44 11/12/2015   LDLCALC 109 11/12/2015   TRIG 102 11/12/2015    Significant Diagnostic Results in last 30 days:  No results found.  Assessment/Plan  1. PVD (peripheral vascular disease) ([place]) C/w skin care, geri sleeves. Prevent trauma and wound.   2. Dementia without behavioral disturbance, unspecified dementia type Supportive care. Skin care.  3. BPH with obstruction/lower urinary tract symptoms Has urinary frequency. Continue flomax with finasteride and perineal care.   4. Failure to thrive in adult Goal of care is for comfort care, no hospitalization, no lab work. Supportive care. Assistance with ADLs as needed. Continue MVI, oral hygiene and perineal hygiene, skin care.  5. Slow transit constipation C/w bisacodyl; qod and senokot s bid. Maintain hydration    Family/ staff  Communication: reviewed care plan with patient and charge nurse. Also spoke over the phone with patinet's daughter Jeani Hawking who is his HCPOA   Labs/tests ordered:  none   Blanchie Serve, MD Internal Medicine [address] [place], [place] [address] Cell Phone (Monday-Friday 8 am - 5 pm): [phone] On Call: [phone] and follow prompts after 5 pm and on weekends Office Phone: [phone] Office Fax: [phone]

## 2018-03-17 ENCOUNTER — Encounter: Payer: Self-pay | Admitting: Internal Medicine

## 2018-03-17 ENCOUNTER — Non-Acute Institutional Stay (SKILLED_NURSING_FACILITY): Payer: PPO | Admitting: Internal Medicine

## 2018-03-17 DIAGNOSIS — F0390 Unspecified dementia without behavioral disturbance: Secondary | ICD-10-CM | POA: Diagnosis not present

## 2018-03-17 DIAGNOSIS — I739 Peripheral vascular disease, unspecified: Secondary | ICD-10-CM | POA: Diagnosis not present

## 2018-03-17 DIAGNOSIS — N401 Enlarged prostate with lower urinary tract symptoms: Secondary | ICD-10-CM

## 2018-03-17 DIAGNOSIS — R627 Adult failure to thrive: Secondary | ICD-10-CM | POA: Diagnosis not present

## 2018-03-17 DIAGNOSIS — N138 Other obstructive and reflux uropathy: Secondary | ICD-10-CM

## 2018-03-17 DIAGNOSIS — R1314 Dysphagia, pharyngoesophageal phase: Secondary | ICD-10-CM

## 2018-03-17 NOTE — Progress Notes (Signed)
.  Location:  [facility] Room Number: 7 Place of Service:  SNF [phone]) Provider:  Blanchie Serve, MD  Blanchie Serve, MD  Patient Care Team: Blanchie Serve, MD as PCP - General (Internal Medicine) Melina Modena, [facility] [name], MD as Consulting Physician (Ophthalmology) [name], MD as Consulting Physician (Gastroenterology) [name], MD as Consulting Physician (Urology) [name], MD as Consulting Physician (Dermatology) Ngetich, Nelda Bucks, NP as Nurse Practitioner [facility])  Extended Emergency Contact Information Primary Emergency Contact: Cheetham,Barbara Address: [address]          [place], [place] [address] [place] Phone: [phone] Relation: Spouse Secondary Emergency Contact: Winnifred Friar [place] Mobile Phone: [phone] Relation: Daughter  Code Status:  DNR  Goals of care: Advanced Directive information Advanced Directives 03/17/2018  Does Patient Have a Medical Advance Directive? Yes  Type of Paramedic of [place];Living will;Out of facility DNR (pink MOST or yellow form)  Does patient want to make changes to medical advance directive? No - Patient declined  Copy of [place] in Chart? Yes  Pre-existing out of facility DNR order (yellow form or pink MOST form) Yellow form placed in chart (order not valid for inpatient use);Pink MOST form placed in chart (order not valid for inpatient use)     Chief Complaint  Patient presents with  . Acute Visit    Ongoing decline    HPI:  Pt is a 84 y.o. male seen today for an acute visit for ongoing decline. He has had minimal po intake. He ate <25% of his meals yesterday. Family (wife and daughter) are involved in his care and would like to focus on comfort measures for him. He is seen in his room today. He does not participate in HPI and ROS. Per nursing, he has refused his  medications. When food has been provided, he appears to have aspirated some. He is currently under total care. He is incontinent with his bowel and bladder.    Past Medical History:  Diagnosis Date  . Colon tumor 1985  . Elevated prostate specific antigen (PSA) 05/13/2011  . Herpes zoster 05/13/2011  . Herpes zoster with ophthalmic complication 10/02/4313  . Hypertrophy of prostate without urinary obstruction and other lower urinary tract symptoms (LUTS) 09/20/2010  . Hypothyroid 01/29/2017  . Memory loss 03/25/2009  . 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 Nodular prostate without urinary obstruction 05/18/2012  . Osteoarthrosis, unspecified whether generalized or localized, unspecified site 09/20/2010  . Other and unspecified hyperlipidemia 09/20/2010  . Personal history of colonic polyps 09/20/2010  . Unspecified constipation 09/24/2010  . Unspecified glaucoma(365.9) 09/20/2010   bilateral   Past Surgical History:  Procedure Laterality Date  . APPENDECTOMY  1940  . CATARACT EXTRACTION W/ INTRAOCULAR LENS IMPLANT Right 2010   Dr. Ellie Lunch  . COLECTOMY  07/17/1997   sigmoid submucosal lipoma Dr. Oletta Lamas  . COLONOSCOPY  2000   normal  . COLONOSCOPY  04/21/2002   polypectomy  . COLONOSCOPY  06/10/2004   no polyps  . SKIN CANCER EXCISION Right 05/19/2013   ear Dr. Dannette Barbara  . TONSILLECTOMY  1938  . TUMOR REMOVAL  1993   Removal of Benign Intestinal Tumor   . VASECTOMY  1972    No Known Allergies  Outpatient Encounter Medications as of 03/17/2018  Medication Sig  . bisacodyl (DULCOLAX) 10 MG suppository Place 10 mg rectally every other day. As needed  .  chlorhexidine (PERIDEX) 0.12 % solution Use as directed in the mouth or throat at bedtime. 1 teaspoon.  . dorzolamide-timolol (COSOPT) 22.3-6.8 MG/ML ophthalmic solution Place 1 drop into both eyes 2 (two) times daily. 8PM and 8PM  . finasteride (PROSCAR) 5 MG tablet Take 5 mg by mouth daily.   . hydrocortisone cream  1 % Apply 1 application topically as needed for itching.  . latanoprost (XALATAN) 0.005 % ophthalmic solution Place 1 drop into both eyes at bedtime.   . Menthol, Topical Analgesic, (BIOFREEZE) 4 % GEL Apply 1 application topically 3 (three) times daily. Apply to the left shoulder   . Menthol, Topical Analgesic, (BIOFREEZE) 4 % GEL Apply 1 application topically 2 (two) times daily as needed.  . Multiple Vitamins-Minerals (CENTRUM) tablet Take 1 tablet by mouth daily.  . NON FORMULARY Take by mouth 2 (two) times daily between meals. Honey thick mighty shakes  . senna-docusate (SENOKOT-S) 8.6-50 MG tablet Take 1 tablet by mouth 2 (two) times daily.   . sodium fluoride (FLUORIDEX DAILY DEFENSE) 1.1 % GEL dental gel Place 1 application onto teeth at bedtime.  . tamsulosin (FLOMAX) 0.4 MG CAPS capsule Take 0.4 mg by mouth daily.   Marland Kitchen zinc oxide 20 % ointment Apply 1 application topically 3 (three) times daily as needed. Apply to buttocks   No facility-administered encounter medications on file as of 03/17/2018.     Review of Systems  Unable to perform ROS: Dementia    Immunization History  Administered Date(s) Administered  . Influenza Whole 06/29/2012, 06/30/2013  . Influenza-Unspecified 07/13/2014, 07/10/2015, 07/10/2016, 07/09/2017  . PPD Test 07/02/2016, 07/16/2016  . Pneumococcal Conjugate-13 05/20/2016  . Pneumococcal Polysaccharide-23 09/30/1995  . Td 09/29/1994  . Tdap 05/04/2017  . Zoster 09/30/2007  . Zoster Recombinat (Shingrix) 07/22/2017   Pertinent  Health Maintenance Due  Topic Date Due  . INFLUENZA VACCINE  04/29/2018  . PNA vac Low Risk Adult  Completed   Fall Risk  05/01/2017 07/03/2016 05/20/2016 02/05/2016 11/20/2015  Falls in the past year? Yes Yes Yes Yes Yes  Number falls in past yr: 2 or more 2 or more 2 or more 2 or more 1  Comment - - 10 times in past 5 months - -  Injury with Fall? No Yes No Yes Yes  Comment - 07/03/16 laceration forehead, subdural hematome - -  scape on elbow  Risk Factor Category  - - High Fall Risk High Fall Risk -  Risk for fall due to : - - - History of fall(s);Impaired mobility -   Functional Status Survey:    Vitals:   03/17/18 0937  BP: 130/68  Pulse: 97  Resp: 18  Temp: (!) 97.2 F (36.2 C)  TempSrc: Oral  SpO2: 94%  Weight: 158 lb 6.4 oz (71.8 kg)  Height: 5\' 8"  (1.727 m)   Body mass index is 24.08 kg/m. Physical Exam  Constitutional: No distress.  Frail elderly male  HENT:  Head: Normocephalic and atraumatic.  Dry mucus membrane  Eyes: Conjunctivae are normal. Right eye exhibits no discharge. Left eye exhibits no discharge.  Neck: Neck supple.  Cardiovascular: Normal rate and regular rhythm.  Palpable distal pulses to left foot but unable to palpate pulses to right foot. Normal temperature to touch, good capillary refill  Pulmonary/Chest: Effort normal. No respiratory distress. He exhibits no tenderness.  Poor air movement, rhonchi present, no wheezing  Abdominal: Soft. Bowel sounds are normal. There is no tenderness.  Musculoskeletal: He exhibits no edema.  Needs  assistance with transfer  Lymphadenopathy:    He has no cervical adenopathy.  Neurological:  Confused this visit, not participating in conversation  Skin: Skin is warm and dry. He is not diaphoretic.    Labs reviewed: Recent Labs    08/03/17  NA 136*  K 4.1  BUN 23*  CREATININE 1.0  CALCIUM 8.6   Recent Labs    08/03/17  AST 17  ALT 12  ALKPHOS 74  PROT 6.0  ALBUMIN 3.5   Recent Labs    05/31/17 06/01/17  WBC 10.5 10.5  HGB 13.4* 13.4*  HCT 39* 39*  PLT 193 193   Lab Results  Component Value Date   TSH 1.83 03/19/2017   No results found for: HGBA1C Lab Results  Component Value Date   CHOL 173 11/12/2015   HDL 44 11/12/2015   LDLCALC 109 11/12/2015   TRIG 102 11/12/2015    Significant Diagnostic Results in last 30 days:  No results found.  Assessment/Plan  1. Adult failure to thrive Ongoing decline with  dementia and deconditioning. Not eating. Requires total care. Likely aspirating his food. Family wants comfort care for him, no hospitalization, discontinue all po medication. Pleasure feeds only. Ativan 0.25 mg q6h prn for anxiety and morphine 20 mg/ml 5 mg/ 0.23 ml q6h prn for dyspnea or pain for now. Family has declined palliative care or hospice services.   2. Dysphagia, pharyngoesophageal phase Pleasure feed only, aspiration precaution and assist with feeding if tolerated, oral hygiene as tolerated.   3. Dementia without behavioral disturbance, unspecified dementia type Supportive care  4. PVD (peripheral vascular disease) ([place]) Skin care.   5. BPH with obstruction/lower urinary tract symptoms D/c his medication. Perineal care, incontinence care   Family/ staff Communication: reviewed care plan with patient's charge nurse.    Labs/tests ordered:  None   Blanchie Serve, MD Internal Medicine [facility] [address] [place], [place] [address] Cell Phone (Monday-Friday 8 am - 5 pm): [phone] On Call: [phone] and follow prompts after 5 pm and on weekends Office Phone: [phone] Office Fax: [phone]

## 2018-03-29 DEATH — deceased
